# Patient Record
Sex: Male | Born: 1950
Health system: Southern US, Community
[De-identification: ages and names within clinical notes are randomized; demographics above are authoritative.]

## PROBLEM LIST (undated history)

## (undated) DIAGNOSIS — Z8669 Personal history of other diseases of the nervous system and sense organs: Secondary | ICD-10-CM

## (undated) DIAGNOSIS — T7840XA Allergy, unspecified, initial encounter: Secondary | ICD-10-CM

## (undated) DIAGNOSIS — I10 Essential (primary) hypertension: Secondary | ICD-10-CM

## (undated) DIAGNOSIS — E785 Hyperlipidemia, unspecified: Secondary | ICD-10-CM

## (undated) DIAGNOSIS — N179 Acute kidney failure, unspecified: Secondary | ICD-10-CM

## (undated) DIAGNOSIS — R519 Headache, unspecified: Secondary | ICD-10-CM

## (undated) HISTORY — DX: Personal history of other diseases of the nervous system and sense organs: Z86.69

## (undated) HISTORY — PX: TONSILLECTOMY: SUR1361

## (undated) HISTORY — DX: Hyperlipidemia, unspecified: E78.5

## (undated) HISTORY — DX: Essential (primary) hypertension: I10

## (undated) HISTORY — DX: Allergy, unspecified, initial encounter: T78.40XA

---
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
## 2014-01-17 ENCOUNTER — Ambulatory Visit (INDEPENDENT_AMBULATORY_CARE_PROVIDER_SITE_OTHER): Payer: Self-pay | Admitting: Physician Assistant

## 2014-01-17 ENCOUNTER — Encounter (INDEPENDENT_AMBULATORY_CARE_PROVIDER_SITE_OTHER): Payer: Self-pay

## 2014-01-17 VITALS — BP 140/82 | HR 78 | Temp 98.2°F | Ht 72.0 in | Wt 204.0 lb

## 2014-01-17 DIAGNOSIS — S81009A Unspecified open wound, unspecified knee, initial encounter: Secondary | ICD-10-CM

## 2014-01-17 DIAGNOSIS — W540XXA Bitten by dog, initial encounter: Secondary | ICD-10-CM

## 2014-01-17 DIAGNOSIS — S81809A Unspecified open wound, unspecified lower leg, initial encounter: Secondary | ICD-10-CM

## 2014-01-17 DIAGNOSIS — S81851A Open bite, right lower leg, initial encounter: Secondary | ICD-10-CM

## 2014-01-17 DIAGNOSIS — Z23 Encounter for immunization: Secondary | ICD-10-CM

## 2014-01-17 DIAGNOSIS — S91009A Unspecified open wound, unspecified ankle, initial encounter: Secondary | ICD-10-CM

## 2014-01-17 MED ORDER — AMOXICILLIN-POT CLAVULANATE 875-125 MG PO TABS: 1.0000 | ORAL_TABLET | Freq: Two times a day (BID) | ORAL | Status: DC

## 2014-01-17 NOTE — Progress Notes (Signed)
Subjective:     [name], male   DOB: 11-01-1950, 63 y.o.   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

## 2014-01-17 NOTE — Patient Instructions (Signed)

## 2014-02-02 ENCOUNTER — Encounter: Payer: Self-pay | Admitting: Family Medicine

## 2014-02-02 ENCOUNTER — Ambulatory Visit (INDEPENDENT_AMBULATORY_CARE_PROVIDER_SITE_OTHER): Payer: Self-pay

## 2014-02-02 ENCOUNTER — Ambulatory Visit (INDEPENDENT_AMBULATORY_CARE_PROVIDER_SITE_OTHER): Payer: Self-pay | Admitting: Family Medicine

## 2014-02-02 VITALS — BP 126/82 | HR 75 | Temp 97.5°F | Ht 72.0 in | Wt 210.0 lb

## 2014-02-02 DIAGNOSIS — M25561 Pain in right knee: Secondary | ICD-10-CM

## 2014-02-02 DIAGNOSIS — M25569 Pain in unspecified knee: Secondary | ICD-10-CM

## 2014-02-02 NOTE — Progress Notes (Signed)
   Subjective:    [name], male    DOB: 1951-05-20, 63 y.o.   MRN: [id number]  HPI This 63 y.o. male presents for evaluation of  Follow up on a dog bite right leg.  He points to behind his knee and c/o discomfort worst in the am when he wakes up.  He has discomfort behind the right knee when walking to his car on occasion. He has been having the discomfort move in the right knee toward the medial aspect of the right knee.  He feels like the knee is unstable at times.  The pain is moderate sharp and is worse at night.  He c/o discomfort when having to walk up incline in right knee.   Review of Systems C/o right knee pain No chest pain, SOB, HA, dizziness, vision change, N/V, diarrhea, constipation, dysuria, urinary urgency or frequency, myalgias, arthralgias or rash.     Objective:   Physical Exam  General  -  63 y/o male in NAD  Normal right knee exam.  No bite or scar or puncture seen right knee or right leg.  No TTP right knee Negative drawer, negative lachman, negative varus or valgus strain and patient is bearing weight w/o limping.  Normal Gait.    Skin - No scarring or wound seen right posterior knee or right femur area.  Xray right knee - Normal right knee Prelimnary reading by Iverson Alamin     Assessment & Plan:  Right knee pain - Plan: DG Knee 1-2 Views Right Tylenol and motrin otc prn for knee discomfort. Follow up prn.  Lysbeth Penner FNP

## 2014-12-26 ENCOUNTER — Encounter: Payer: Self-pay | Admitting: Family Medicine

## 2014-12-26 ENCOUNTER — Encounter (INDEPENDENT_AMBULATORY_CARE_PROVIDER_SITE_OTHER): Payer: Self-pay

## 2014-12-26 ENCOUNTER — Ambulatory Visit (INDEPENDENT_AMBULATORY_CARE_PROVIDER_SITE_OTHER): Payer: Self-pay | Admitting: Family Medicine

## 2014-12-26 VITALS — BP 138/91 | HR 64 | Temp 97.0°F | Ht 72.0 in | Wt 216.0 lb

## 2014-12-26 DIAGNOSIS — M720 Palmar fascial fibromatosis [Dupuytren]: Secondary | ICD-10-CM

## 2014-12-26 DIAGNOSIS — Z0000 Encounter for general adult medical examination without abnormal findings: Secondary | ICD-10-CM

## 2014-12-26 DIAGNOSIS — IMO0001 Reserved for inherently not codable concepts without codable children: Secondary | ICD-10-CM | POA: Insufficient documentation

## 2014-12-26 DIAGNOSIS — R011 Cardiac murmur, unspecified: Secondary | ICD-10-CM | POA: Insufficient documentation

## 2014-12-26 DIAGNOSIS — R030 Elevated blood-pressure reading, without diagnosis of hypertension: Secondary | ICD-10-CM

## 2014-12-26 LAB — GLUCOSE, POCT (MANUAL RESULT ENTRY): POC GLUCOSE: 100 mg/dL — AB (ref 70–99)

## 2014-12-26 NOTE — Progress Notes (Signed)
   Subjective:    [name], male    DOB: 1951/06/16, 64 y.o.   MRN: [id number]  HPI  64 year old male who really hasn't gotten any medical care for years. He formerly was active playing tennis and basketball in college. 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 He does not have any insurance and would like to keep lab work to a minimum. I did discuss basic recommendations such as blood sugar lipids PSA as well as exam such as colonoscopy but he wants to defer most of these tests. He has concerns about some changes in his skin which consist of purplish areas family history is not especially revealing in terms of inheritable diseases.  .  Review of Systems  Constitutional: Negative.   HENT: Negative.   Respiratory: Negative.   Cardiovascular: Negative.   Gastrointestinal: Negative.   Skin: Positive for color change.  Psychiatric/Behavioral: Negative.        Objective:   Physical Exam  Constitutional: He appears well-developed and well-nourished.  HENT:  Head: Normocephalic.  Eyes: Pupils are equal, round, and reactive to light.  Neck: Normal range of motion.  Cardiovascular: Normal rate and regular rhythm.   There is a systolic murmur heard at the apex suggesting mitral valve disease. He has never been told that he has murmur I would write this a grade 2-3/6  Pulmonary/Chest: Effort normal and breath sounds normal.  Abdominal: Soft. Bowel sounds are normal.  Genitourinary: Prostate normal.  Musculoskeletal: Normal range of motion.  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 There are also scars around his left knee secondary to prior surgeries and injuries when playing basketball  Skin:  Discoloration looks like livido reticularis  Psychiatric: He has a normal mood and affect. His behavior is normal.    BP 138/91 mmHg  Pulse 64  Temp(Src) 97 F (36.1 C) (Oral)  Ht 6' (1.829  m)  Wt 216 lb (97.977 kg)  BMI 29.29 kg/m2        Assessment & Plan:  1. Health care maintenance Blood sugar at 100 is not remarkable. I did encourage watching carbohydrates regular exercise and weight loss - POCT glucose (manual entry)  2. Newly recognized heart murmur I think this is related to mitral valve disease. Since patient does not have insurance he is not inclined to see a cardiologist or have an echocardiogram to further define this new finding. He denies any shortness of breath except as related to deconditioning and age but cautioned if he starts getting more short of breath we might need to have his heart evaluated sooner than later  3. Elevated blood pressure Blood pressure is mildly elevated today. In my reading it was 140/100. 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 There are no definite contractures as yet but may need services of an orthopedist in the futureen's contracture Wardell Honour MD

## 2014-12-26 NOTE — Patient Instructions (Signed)
Health Maintenance A healthy lifestyle and preventative care can promote health and wellness.  Maintain regular health, dental, and eye exams.  Eat a healthy diet. Foods like vegetables, fruits, whole grains, low-fat dairy products, and lean protein foods contain the nutrients you need and are low in calories. Decrease your intake of foods high in solid fats, added sugars, and salt. Get information about a proper diet from your health care provider, if necessary.  Regular physical exercise is one of the most important things you can do for your health. Most adults should get at least 150 minutes of moderate-intensity exercise (any activity that increases your heart rate and causes you to sweat) each week. In addition, most adults need muscle-strengthening exercises on 2 or more days a week.   Maintain a healthy weight. The body mass index (BMI) is a screening tool to identify possible weight problems. It provides an estimate of body fat based on height and weight. Your health care provider can find your BMI and can help you achieve or maintain a healthy weight. For males 20 years and older:  A BMI below 18.5 is considered underweight.  A BMI of 18.5 to 24.9 is normal.  A BMI of 25 to 29.9 is considered overweight.  A BMI of 30 and above is considered obese.  Maintain normal blood lipids and cholesterol by exercising and minimizing your intake of saturated fat. Eat a balanced diet with plenty of fruits and vegetables. Blood tests for lipids and cholesterol should begin at age 20 and be repeated every 5 years. If your lipid or cholesterol levels are high, you are over age 50, or you are at high risk for heart disease, you may need your cholesterol levels checked more frequently.Ongoing high lipid and cholesterol levels should be treated with medicines if diet and exercise are not working.  If you smoke, find out from your health care provider how to quit. If you do not use tobacco, do not  start.  Lung cancer screening is recommended for adults aged 55-80 years who are at high risk for developing lung cancer because of a history of smoking. A yearly low-dose CT scan of the lungs is recommended for people who have at least a 30-pack-year history of smoking and are current smokers or have quit within the past 15 years. A pack year of smoking is smoking an average of 1 pack of cigarettes a day for 1 year (for example, a 30-pack-year history of smoking could mean smoking 1 pack a day for 30 years or 2 packs a day for 15 years). Yearly screening should continue until the smoker has stopped smoking for at least 15 years. Yearly screening should be stopped for people who develop a health problem that would prevent them from having lung cancer treatment.  If you choose to drink alcohol, do not have more than 2 drinks per day. One drink is considered to be 12 oz (360 mL) of beer, 5 oz (150 mL) of wine, or 1.5 oz (45 mL) of liquor.  Avoid the use of street drugs. Do not share needles with anyone. Ask for help if you need support or instructions about stopping the use of drugs.  High blood pressure causes heart disease and increases the risk of stroke. Blood pressure should be checked at least every 1-2 years. Ongoing high blood pressure should be treated with medicines if weight loss and exercise are not effective.  If you are 45-79 years old, ask your health care provider if   you should take aspirin to prevent heart disease.  Diabetes screening involves taking a blood sample to check your fasting blood sugar level. This should be done once every 3 years after age 45 if you are at a normal weight and without risk factors for diabetes. Testing should be considered at a younger age or be carried out more frequently if you are overweight and have at least 1 risk factor for diabetes.  Colorectal cancer can be detected and often prevented. Most routine colorectal cancer screening begins at the age of 50  and continues through age 75. However, your health care provider may recommend screening at an earlier age if you have risk factors for colon cancer. On a yearly basis, your health care provider may provide home test kits to check for hidden blood in the stool. A small camera at the end of a tube may be used to directly examine the colon (sigmoidoscopy or colonoscopy) to detect the earliest forms of colorectal cancer. Talk to your health care provider about this at age 50 when routine screening begins. A direct exam of the colon should be repeated every 5-10 years through age 75, unless early forms of precancerous polyps or small growths are found.  People who are at an increased risk for hepatitis B should be screened for this virus. You are considered at high risk for hepatitis B if:  You were born in a country where hepatitis B occurs often. Talk with your health care provider about which countries are considered high risk.  Your parents were born in a high-risk country and you have not received a shot to protect against hepatitis B (hepatitis B vaccine).  You have HIV or AIDS.  You use needles to inject street drugs.  You live with, or have sex with, someone who has hepatitis B.  You are a man who has sex with other men (MSM).  You get hemodialysis treatment.  You take certain medicines for conditions like cancer, organ transplantation, and autoimmune conditions.  Hepatitis C blood testing is recommended for all people born from 1945 through 1965 and any individual with known risk factors for hepatitis C.  Healthy men should no longer receive prostate-specific antigen (PSA) blood tests as part of routine cancer screening. Talk to your health care provider about prostate cancer screening.  Testicular cancer screening is not recommended for adolescents or adult males who have no symptoms. Screening includes self-exam, a health care provider exam, and other screening tests. Consult with your  health care provider about any symptoms you have or any concerns you have about testicular cancer.  Practice safe sex. Use condoms and avoid high-risk sexual practices to reduce the spread of sexually transmitted infections (STIs).  You should be screened for STIs, including gonorrhea and chlamydia if:  You are sexually active and are younger than 24 years.  You are older than 24 years, and your health care provider tells you that you are at risk for this type of infection.  Your sexual activity has changed since you were last screened, and you are at an increased risk for chlamydia or gonorrhea. Ask your health care provider if you are at risk.  If you are at risk of being infected with HIV, it is recommended that you take a prescription medicine daily to prevent HIV infection. This is called pre-exposure prophylaxis (PrEP). You are considered at risk if:  You are a man who has sex with other men (MSM).  You are a heterosexual man who   is sexually active with multiple partners.  You take drugs by injection.  You are sexually active with a partner who has HIV.  Talk with your health care provider about whether you are at high risk of being infected with HIV. If you choose to begin PrEP, you should first be tested for HIV. You should then be tested every 3 months for as long as you are taking PrEP.  Use sunscreen. Apply sunscreen liberally and repeatedly throughout the day. You should seek shade when your shadow is shorter than you. Protect yourself by wearing long sleeves, pants, a wide-brimmed hat, and sunglasses year round whenever you are outdoors.  Tell your health care provider of new moles or changes in moles, especially if there is a change in shape or color. Also, tell your health care provider if a mole is larger than the size of a pencil eraser.  A one-time screening for abdominal aortic aneurysm (AAA) and surgical repair of large AAAs by ultrasound is recommended for men aged  65-75 years who are current or former smokers.  Stay current with your vaccines (immunizations). Document Released: 01/09/2008 Document Revised: 07/18/2013 Document Reviewed: 12/08/2010 ExitCare Patient Information 2015 [employer]. This information is not intended to replace advice given to you by your health care provider. Make sure you discuss any questions you have with your health care provider.  Hypertension Hypertension, commonly called high blood pressure, is when the force of blood pumping through your arteries is too strong. Your arteries are the blood vessels that carry blood from your heart throughout your body. A blood pressure reading consists of a higher number over a lower number, such as 110/72. The higher number (systolic) is the pressure inside your arteries when your heart pumps. The lower number (diastolic) is the pressure inside your arteries when your heart relaxes. Ideally you want your blood pressure below 120/80. Hypertension forces your heart to work harder to pump blood. Your arteries may become narrow or stiff. Having hypertension puts you at risk for heart disease, stroke, and other problems.  RISK FACTORS Some risk factors for high blood pressure are controllable. Others are not.  Risk factors you cannot control include:   Race. You may be at higher risk if you are African American.  Age. Risk increases with age.  Gender. Men are at higher risk than women before age 45 years. After age 65, women are at higher risk than men. Risk factors you can control include:  Not getting enough exercise or physical activity.  Being overweight.  Getting too much fat, sugar, calories, or salt in your diet.  Drinking too much alcohol. SIGNS AND SYMPTOMS Hypertension does not usually cause signs or symptoms. Extremely high blood pressure (hypertensive crisis) may cause headache, anxiety, shortness of breath, and nosebleed. DIAGNOSIS  To check if you have hypertension,  your health care provider will measure your blood pressure while you are seated, with your arm held at the level of your heart. It should be measured at least twice using the same arm. Certain conditions can cause a difference in blood pressure between your right and left arms. A blood pressure reading that is higher than normal on one occasion does not mean that you need treatment. If one blood pressure reading is high, ask your health care provider about having it checked again. TREATMENT  Treating high blood pressure includes making lifestyle changes and possibly taking medicine. Living a healthy lifestyle can help lower high blood pressure. You may need to change   some of your habits. Lifestyle changes may include:  Following the DASH diet. This diet is high in fruits, vegetables, and whole grains. It is low in salt, red meat, and added sugars.  Getting at least 2 hours of brisk physical activity every week.  Losing weight if necessary.  Not smoking.  Limiting alcoholic beverages.  Learning ways to reduce stress. If lifestyle changes are not enough to get your blood pressure under control, your health care provider may prescribe medicine. You may need to take more than one. Work closely with your health care provider to understand the risks and benefits. HOME CARE INSTRUCTIONS  Have your blood pressure rechecked as directed by your health care provider.   Take medicines only as directed by your health care provider. Follow the directions carefully. Blood pressure medicines must be taken as prescribed. The medicine does not work as well when you skip doses. Skipping doses also puts you at risk for problems.   Do not smoke.   Monitor your blood pressure at home as directed by your health care provider. SEEK MEDICAL CARE IF:   You think you are having a reaction to medicines taken.  You have recurrent headaches or feel dizzy.  You have swelling in your ankles.  You have  trouble with your vision. SEEK IMMEDIATE MEDICAL CARE IF:  You develop a severe headache or confusion.  You have unusual weakness, numbness, or feel faint.  You have severe chest or abdominal pain.  You vomit repeatedly.  You have trouble breathing. MAKE SURE YOU:   Understand these instructions.  Will watch your condition.  Will get help right away if you are not doing well or get worse. Document Released: 07/13/2005 Document Revised: 11/27/2013 Document Reviewed: 05/05/2013 ExitCare Patient Information 2015 [employer]. This information is not intended to replace advice given to you by your health care provider. Make sure you discuss any questions you have with your health care provider.  

## 2015-02-15 IMAGING — CR DG KNEE 1-2V*R*
2 series · 2 of 2 positions shown · non-contrast
Comparison: None.

CLINICAL DATA: Right knee pain

EXAM:
RIGHT KNEE - 1-2 VIEW

[view not recorded (1 of 2)]
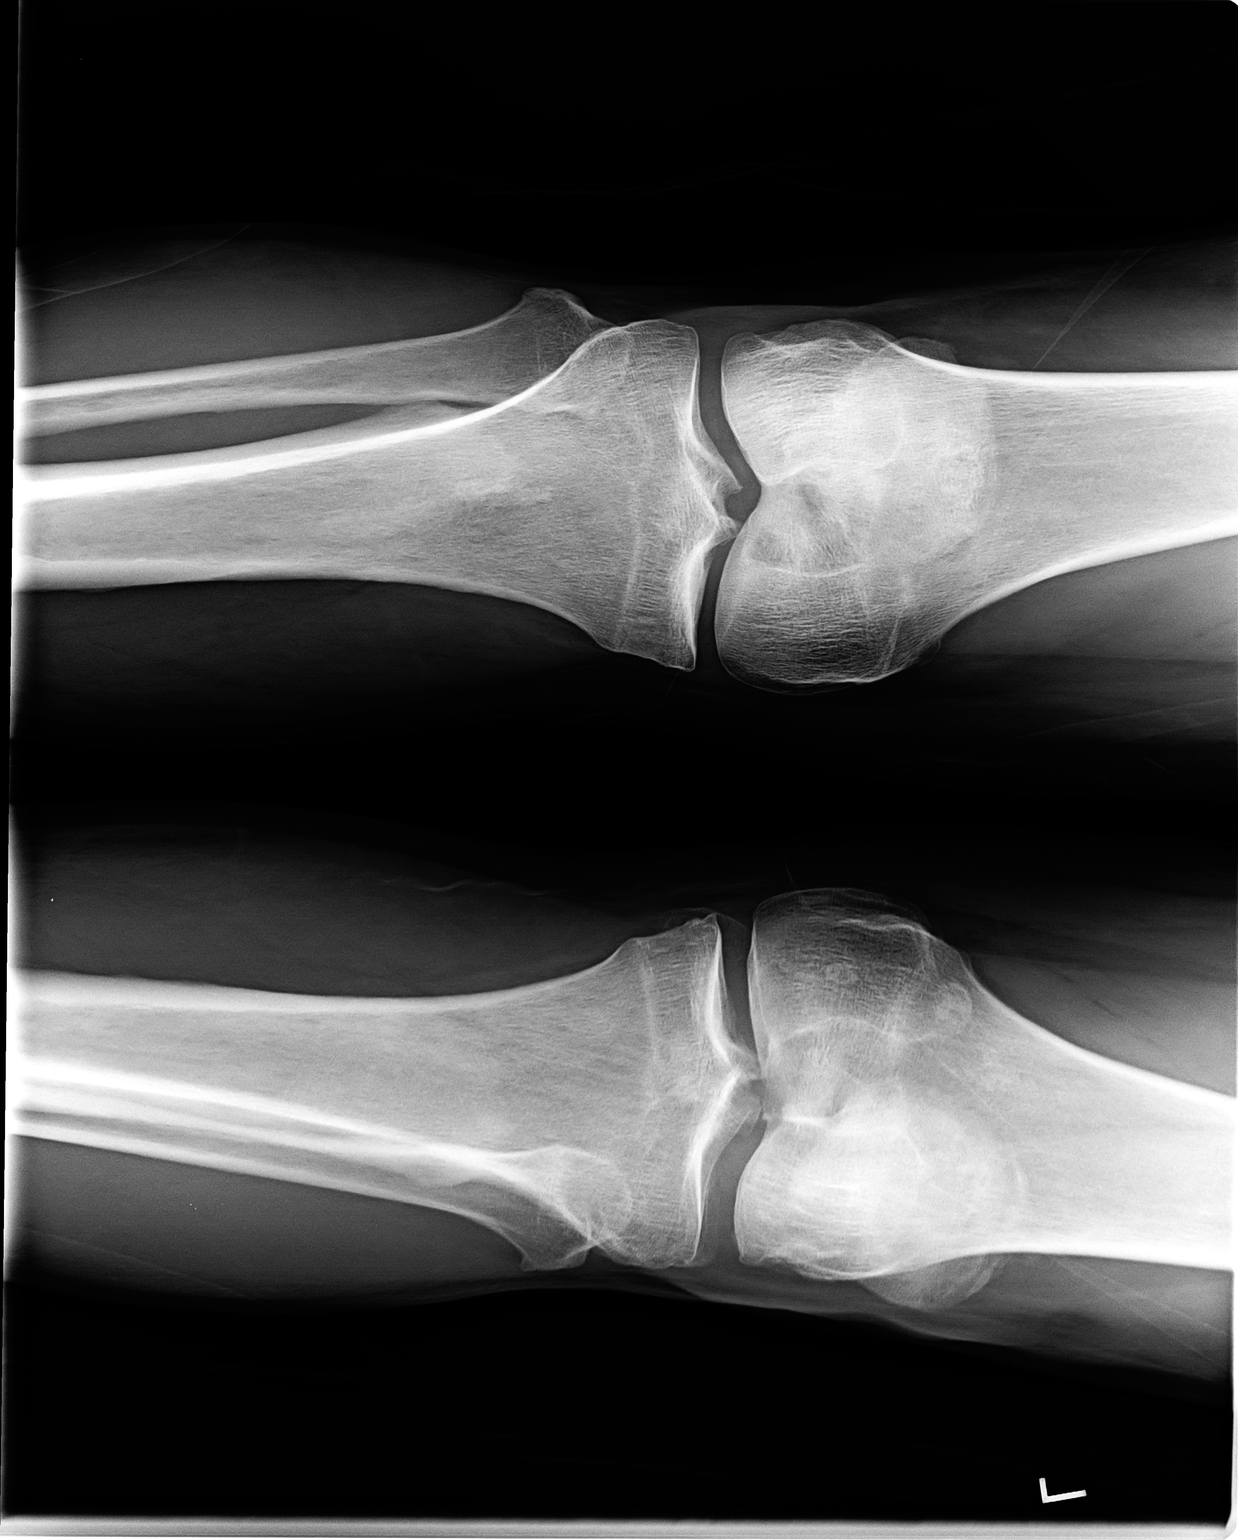

[view not recorded (2 of 2)]
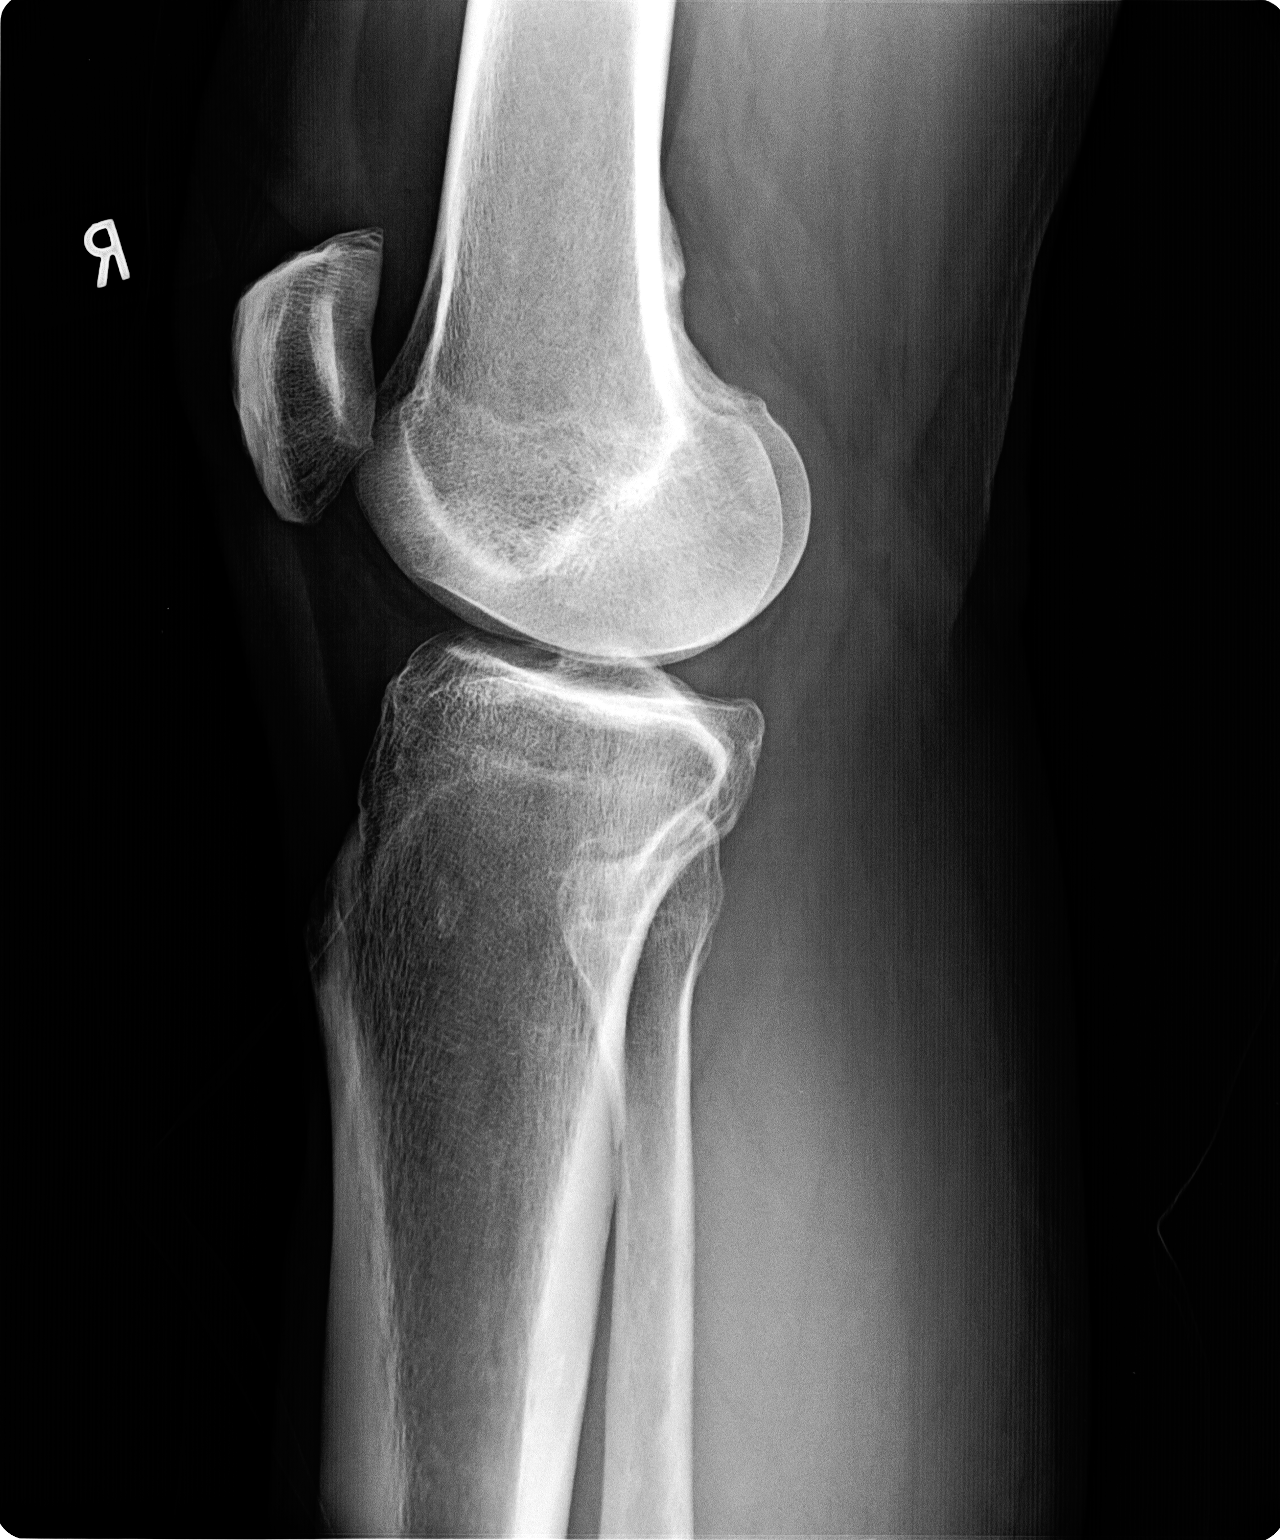

[2 of 2 positions shown; findings below may reference images not displayed]

FINDINGS: There is no evidence of fracture, dislocation, or joint effusion.
There is no evidence of arthropathy or other focal bone abnormality.
Soft tissues are unremarkable.
IMPRESSION: Negative.

## 2018-01-03 ENCOUNTER — Encounter: Payer: Self-pay | Admitting: Gastroenterology

## 2018-01-06 ENCOUNTER — Other Ambulatory Visit: Payer: Self-pay

## 2018-01-06 ENCOUNTER — Ambulatory Visit (AMBULATORY_SURGERY_CENTER): Payer: Self-pay | Admitting: *Deleted

## 2018-01-06 VITALS — Ht 71.0 in | Wt 194.0 lb

## 2018-01-06 DIAGNOSIS — Z1211 Encounter for screening for malignant neoplasm of colon: Secondary | ICD-10-CM

## 2018-01-06 MED ORDER — PEG-KCL-NACL-NASULF-NA ASC-C 140 G PO SOLR: 1.0000 | Freq: Once | ORAL | 0 refills | Status: AC

## 2018-01-06 NOTE — Progress Notes (Signed)
Patient denies any allergies to egg or soy products. Patient denies complications with anesthesia/sedation.  Patient denies oxygen use at home and denies diet medications.  Pamphlet given on colonoscopy procedure.

## 2018-01-19 ENCOUNTER — Telehealth: Payer: Self-pay | Admitting: Gastroenterology

## 2018-01-19 NOTE — Telephone Encounter (Signed)
Pt's girlfriend Joelene Millin called to inform that plenvu is not covered but insurance and is over $200. She wants to know if there is something similar that he can get.

## 2018-01-19 NOTE — Telephone Encounter (Signed)
Spoke with patient. Due to expense of the Plenvu, costing over $200, the patient will come by our office to pick up a sample. Plenvu sample, Lot #[id number],Exp 07/202 will be left at the front desk on the 4th floor. The pt will come by to pick the sample in the am and call our office if he has other questions. Gwyndolyn Saxon.

## 2018-01-25 ENCOUNTER — Encounter: Payer: Self-pay | Admitting: Gastroenterology

## 2018-02-03 ENCOUNTER — Encounter: Payer: Self-pay | Admitting: Gastroenterology

## 2018-02-03 ENCOUNTER — Ambulatory Visit (AMBULATORY_SURGERY_CENTER): Payer: Medicare HMO | Admitting: Gastroenterology

## 2018-02-03 ENCOUNTER — Other Ambulatory Visit: Payer: Self-pay

## 2018-02-03 VITALS — BP 137/95 | HR 69 | Temp 98.6°F | Resp 12 | Ht 71.0 in | Wt 194.0 lb

## 2018-02-03 DIAGNOSIS — D125 Benign neoplasm of sigmoid colon: Secondary | ICD-10-CM

## 2018-02-03 DIAGNOSIS — D120 Benign neoplasm of cecum: Secondary | ICD-10-CM

## 2018-02-03 DIAGNOSIS — D124 Benign neoplasm of descending colon: Secondary | ICD-10-CM

## 2018-02-03 DIAGNOSIS — Z800 Family history of malignant neoplasm of digestive organs: Secondary | ICD-10-CM | POA: Diagnosis not present

## 2018-02-03 DIAGNOSIS — D122 Benign neoplasm of ascending colon: Secondary | ICD-10-CM | POA: Diagnosis not present

## 2018-02-03 DIAGNOSIS — D123 Benign neoplasm of transverse colon: Secondary | ICD-10-CM | POA: Diagnosis not present

## 2018-02-03 DIAGNOSIS — Z1211 Encounter for screening for malignant neoplasm of colon: Secondary | ICD-10-CM

## 2018-02-03 DIAGNOSIS — K635 Polyp of colon: Secondary | ICD-10-CM

## 2018-02-03 DIAGNOSIS — D128 Benign neoplasm of rectum: Secondary | ICD-10-CM

## 2018-02-03 MED ORDER — SODIUM CHLORIDE 0.9 % IV SOLN: 500.0000 mL | Freq: Once | INTRAVENOUS | Status: DC

## 2018-02-03 NOTE — Patient Instructions (Signed)
YOU HAD AN ENDOSCOPIC PROCEDURE TODAY AT [facility]:   Refer to the procedure report that was given to you for any specific questions about what was found during the examination.  If the procedure report does not answer your questions, please call your gastroenterologist to clarify.  If you requested that your care partner not be given the details of your procedure findings, then the procedure report has been included in a sealed envelope for you to review at your convenience later.  YOU SHOULD EXPECT: Some feelings of bloating in the abdomen. Passage of more gas than usual.  Walking can help get rid of the air that was put into your GI tract during the procedure and reduce the bloating. If you had a lower endoscopy (such as a colonoscopy or flexible sigmoidoscopy) you may notice spotting of blood in your stool or on the toilet paper. If you underwent a bowel prep for your procedure, you may not have a normal bowel movement for a few days.  Please Note:  You might notice some irritation and congestion in your nose or some drainage.  This is from the oxygen used during your procedure.  There is no need for concern and it should clear up in a day or so.  SYMPTOMS TO REPORT IMMEDIATELY:   Following lower endoscopy (colonoscopy or flexible sigmoidoscopy):  Excessive amounts of blood in the stool  Significant tenderness or worsening of abdominal pains  Swelling of the abdomen that is new, acute  Fever of 100F or higher   For urgent or emergent issues, a gastroenterologist can be reached at any hour by calling [phone].   DIET:  We do recommend a small meal at first, but then you may proceed to your regular diet.  Drink plenty of fluids but you should avoid alcoholic beverages for 24 hours.  ACTIVITY:  You should plan to take it easy for the rest of today and you should NOT DRIVE or use heavy machinery until tomorrow (because of the sedation medicines used during the test).     FOLLOW UP: Our staff will call the number listed on your records the next business day following your procedure to check on you and address any questions or concerns that you may have regarding the information given to you following your procedure. If we do not reach you, we will leave a message.  However, if you are feeling well and you are not experiencing any problems, there is no need to return our call.  We will assume that you have returned to your regular daily activities without incident.  If any biopsies were taken you will be contacted by phone or by letter within the next 1-3 weeks.  Please call us at [phone] if you have not heard about the biopsies in 3 weeks.    SIGNATURES/CONFIDENTIALITY: You and/or your care partner have signed paperwork which will be entered into your electronic medical record.  These signatures attest to the fact that that the information above on your After Visit Summary has been reviewed and is understood.  Full responsibility of the confidentiality of this discharge information lies with you and/or your care-partner.  Read all of the papers given to you by your recovery room nurse.

## 2018-02-03 NOTE — Progress Notes (Signed)
Pt's states no medical or surgical changes since previsit or office visit. 

## 2018-02-03 NOTE — Progress Notes (Signed)
Called to room to assist during endoscopic procedure.  Patient ID and intended procedure confirmed with present staff. Received instructions for my participation in the procedure from the performing physician.  

## 2018-02-03 NOTE — Op Note (Signed)
[facility] Patient Name: Mike Thomas Procedure Date: 02/03/2018 4:25 PM MRN: [id number] Endoscopist: Mallie Mussel L. Loletha Carrow , MD Age: 67 Referring MD:  Date of Birth: March 22, 1951 Gender: Male Account #: [id number] Procedure:                Colonoscopy Indications:              Screening in patient at increased risk: Colorectal                            cancer in mother 22 or older (age 31), This is the                            patient's first colonoscopy Medicines:                Monitored Anesthesia Care Procedure:                Pre-Anesthesia Assessment:                           - Prior to the procedure, a History and Physical                            was performed, and patient medications and                            allergies were reviewed. The patient's tolerance of                            previous anesthesia was also reviewed. The risks                            and benefits of the procedure and the sedation                            options and risks were discussed with the patient.                            All questions were answered, and informed consent                            was obtained. Prior Anticoagulants: The patient has                            taken no previous anticoagulant or antiplatelet                            agents. ASA Grade Assessment: II - A patient with                            mild systemic disease. After reviewing the risks                            and benefits, the patient was deemed in  satisfactory condition to undergo the procedure.                           After obtaining informed consent, the colonoscope                            was passed under direct vision. Throughout the                            procedure, the patient's blood pressure, pulse, and                            oxygen saturations were monitored continuously. The                            Model CF-HQ190L [id number])  scope was introduced                            through the anus and advanced to the the cecum,                            identified by appendiceal orifice and ileocecal                            valve. The colonoscopy was performed without                            difficulty. The patient tolerated the procedure                            well. The colonoscopy was performed without                            difficulty. The patient tolerated the procedure                            well. The quality of the bowel preparation was                            good. The ileocecal valve, appendiceal orifice, and                            rectum were photographed. The quality of the bowel                            preparation was evaluated using the BBPS [facility]                            Bowel Preparation Scale) with scores of: Right                            Colon = 2, Transverse Colon = 2 and Left Colon = 2.  The total BBPS score equals 6. Scope In: 4:29:35 PM Scope Out: 4:56:55 PM Scope Withdrawal Time: 0 hours 21 minutes 33 seconds  Total Procedure Duration: 0 hours 27 minutes 20 seconds  Findings:                 The perianal and digital rectal examinations were                            normal.                           Three sessile polyps were found in the transverse                            colon, ascending colon and ileocecal valve. The                            polyps were 4 to 8 mm in size. These polyps were                            removed with a cold snare. Resection and retrieval                            were complete.                           Three sessile polyps were found in the descending                            colon and transverse colon. The polyps were 4 to 6                            mm in size. These polyps were removed with a cold                            snare. Resection and retrieval were complete.                            Two sessile polyps were found in the descending                            colon. The polyps were 4 to 6 mm in size. These                            polyps were removed with a cold snare. Resection                            and retrieval were complete.                           Two sessile polyps were found in the rectum and                            sigmoid colon.  The polyps were 4 mm in size. These                            polyps were removed with a cold snare. Resection                            and retrieval were complete.                           Internal hemorrhoids were found. The hemorrhoids                            were Grade I (internal hemorrhoids that do not                            prolapse).                           The exam was otherwise without abnormality on                            direct and retroflexion views. Complications:            No immediate complications. Estimated Blood Loss:     Estimated blood loss was minimal. Impression:               - Three 4 to 8 mm polyps in the transverse colon,                            in the ascending colon and at the ileocecal valve,                            removed with a cold snare. Resected and retrieved.                           - Three 4 to 6 mm polyps in the descending colon                            and in the transverse colon, removed with a cold                            snare. Resected and retrieved.                           - Two 4 to 6 mm polyps in the descending colon,                            removed with a cold snare. Resected and retrieved.                           - Two 4 mm polyps in the rectum and in the sigmoid                            colon, removed  with a cold snare. Resected and                            retrieved.                           - Internal hemorrhoids.                           - The examination was otherwise normal on direct                            and  retroflexion views. Recommendation:           - Patient has a contact number available for                            emergencies. The signs and symptoms of potential                            delayed complications were discussed with the                            patient. Return to normal activities tomorrow.                            Written discharge instructions were provided to the                            patient.                           - Resume previous diet.                           - Continue present medications.                           - Await pathology results.                           - Repeat colonoscopy is recommended for                            surveillance. The colonoscopy date will be                            determined after pathology results from today's                            exam become available for review. Henry L. Loletha Carrow, MD 02/03/2018 5:15:48 PM This report has been signed electronically.

## 2018-02-03 NOTE — Progress Notes (Signed)
Report given to PACU, vss 

## 2018-02-04 ENCOUNTER — Telehealth: Payer: Self-pay | Admitting: *Deleted

## 2018-02-04 NOTE — Telephone Encounter (Signed)
  Follow up Call-  Call back number 02/03/2018  Post procedure Call Back phone  # [phone] cell  Permission to leave phone message Yes  Some recent data might be hidden     Patient questions:  Message left to call us if necessary.

## 2018-02-04 NOTE — Telephone Encounter (Signed)
  Follow up Call-  Call back number 02/03/2018  Post procedure Call Back phone  # [phone] cell  Permission to leave phone message Yes  Some recent data might be hidden     Patient questions:  Do you have a fever, pain , or abdominal swelling? No. Pain Score  0 *  Have you tolerated food without any problems? Yes.    Have you been able to return to your normal activities? Yes.    Do you have any questions about your discharge instructions: Diet   Yes.   Medications  No. Follow up visit  No.  Do you have questions or concerns about your Care? Yes.    Actions: * If pain score is 4 or above: No action needed, pain <4.

## 2018-02-13 ENCOUNTER — Encounter: Payer: Self-pay | Admitting: Gastroenterology

## 2019-02-07 ENCOUNTER — Encounter: Payer: Self-pay | Admitting: Gastroenterology

## 2020-09-25 ENCOUNTER — Encounter (HOSPITAL_COMMUNITY)
Admission: EM | Disposition: A | Discharge status: Rehabilitation Facility | Payer: Self-pay | Source: Other Acute Inpatient Hospital | Attending: Neurological Surgery

## 2020-09-25 ENCOUNTER — Inpatient Hospital Stay (HOSPITAL_COMMUNITY)
Admission: RE | Admit: 2020-09-25 | Payer: Medicare HMO | Source: Other Acute Inpatient Hospital | Admitting: Neurological Surgery

## 2020-09-25 ENCOUNTER — Emergency Department (HOSPITAL_COMMUNITY): Payer: Medicare HMO | Admitting: Anesthesiology

## 2020-09-25 ENCOUNTER — Inpatient Hospital Stay (HOSPITAL_COMMUNITY)
Admission: EM | Admit: 2020-09-25 | Discharge: 2020-10-03 | DRG: 026 | Disposition: A | Discharge status: Rehabilitation Facility | Payer: Medicare HMO | Source: Other Acute Inpatient Hospital | Attending: Neurological Surgery | Admitting: Neurological Surgery

## 2020-09-25 ENCOUNTER — Encounter (HOSPITAL_COMMUNITY): Payer: Self-pay | Admitting: General Practice

## 2020-09-25 DIAGNOSIS — S065XAA Traumatic subdural hemorrhage with loss of consciousness status unknown, initial encounter: Secondary | ICD-10-CM | POA: Diagnosis present

## 2020-09-25 DIAGNOSIS — E785 Hyperlipidemia, unspecified: Secondary | ICD-10-CM | POA: Diagnosis present

## 2020-09-25 DIAGNOSIS — I10 Essential (primary) hypertension: Secondary | ICD-10-CM | POA: Diagnosis present

## 2020-09-25 DIAGNOSIS — R259 Unspecified abnormal involuntary movements: Secondary | ICD-10-CM | POA: Diagnosis not present

## 2020-09-25 DIAGNOSIS — I4891 Unspecified atrial fibrillation: Secondary | ICD-10-CM

## 2020-09-25 DIAGNOSIS — Z79899 Other long term (current) drug therapy: Secondary | ICD-10-CM

## 2020-09-25 DIAGNOSIS — I6201 Nontraumatic acute subdural hemorrhage: Principal | ICD-10-CM | POA: Diagnosis present

## 2020-09-25 DIAGNOSIS — S065X9D Traumatic subdural hemorrhage with loss of consciousness of unspecified duration, subsequent encounter: Secondary | ICD-10-CM | POA: Diagnosis not present

## 2020-09-25 DIAGNOSIS — W19XXXA Unspecified fall, initial encounter: Secondary | ICD-10-CM | POA: Diagnosis present

## 2020-09-25 DIAGNOSIS — R339 Retention of urine, unspecified: Secondary | ICD-10-CM | POA: Diagnosis not present

## 2020-09-25 DIAGNOSIS — I350 Nonrheumatic aortic (valve) stenosis: Secondary | ICD-10-CM | POA: Diagnosis present

## 2020-09-25 DIAGNOSIS — I4819 Other persistent atrial fibrillation: Secondary | ICD-10-CM | POA: Diagnosis present

## 2020-09-25 DIAGNOSIS — G441 Vascular headache, not elsewhere classified: Secondary | ICD-10-CM | POA: Diagnosis not present

## 2020-09-25 DIAGNOSIS — Z9889 Other specified postprocedural states: Secondary | ICD-10-CM

## 2020-09-25 DIAGNOSIS — E871 Hypo-osmolality and hyponatremia: Secondary | ICD-10-CM | POA: Diagnosis not present

## 2020-09-25 DIAGNOSIS — K5903 Drug induced constipation: Secondary | ICD-10-CM | POA: Diagnosis not present

## 2020-09-25 DIAGNOSIS — F17210 Nicotine dependence, cigarettes, uncomplicated: Secondary | ICD-10-CM | POA: Diagnosis present

## 2020-09-25 DIAGNOSIS — Z9181 History of falling: Secondary | ICD-10-CM

## 2020-09-25 DIAGNOSIS — R8279 Other abnormal findings on microbiological examination of urine: Secondary | ICD-10-CM | POA: Diagnosis not present

## 2020-09-25 DIAGNOSIS — M7989 Other specified soft tissue disorders: Secondary | ICD-10-CM | POA: Diagnosis not present

## 2020-09-25 DIAGNOSIS — I6203 Nontraumatic chronic subdural hemorrhage: Secondary | ICD-10-CM | POA: Diagnosis present

## 2020-09-25 DIAGNOSIS — S065X9A Traumatic subdural hemorrhage with loss of consciousness of unspecified duration, initial encounter: Secondary | ICD-10-CM | POA: Diagnosis not present

## 2020-09-25 DIAGNOSIS — I5020 Unspecified systolic (congestive) heart failure: Secondary | ICD-10-CM

## 2020-09-25 DIAGNOSIS — R609 Edema, unspecified: Secondary | ICD-10-CM | POA: Diagnosis not present

## 2020-09-25 DIAGNOSIS — Z781 Physical restraint status: Secondary | ICD-10-CM | POA: Diagnosis not present

## 2020-09-25 DIAGNOSIS — N179 Acute kidney failure, unspecified: Secondary | ICD-10-CM | POA: Diagnosis not present

## 2020-09-25 LAB — ABO/RH: ABO/RH(D): A POS

## 2020-09-25 LAB — MRSA PCR SCREENING: MRSA by PCR: NEGATIVE

## 2020-09-25 LAB — TYPE AND SCREEN
ABO/RH(D): A POS
Antibody Screen: NEGATIVE

## 2020-09-25 LAB — TSH: TSH: 1.66 u[IU]/mL (ref 0.350–4.500)

## 2020-09-25 SURGERY — CRANIOTOMY HEMATOMA EVACUATION SUBDURAL
Anesthesia: General | Laterality: Bilateral

## 2020-09-25 MED ORDER — ORAL CARE MOUTH RINSE
15.0000 mL | Freq: Once | OROMUCOSAL | Status: AC
  Administered 2020-09-25: 15 mL via OROMUCOSAL

## 2020-09-25 MED ORDER — METOPROLOL TARTRATE 5 MG/5ML IV SOLN: 5.0000 mg | Freq: Once | INTRAVENOUS | Status: AC

## 2020-09-25 MED ORDER — MIDAZOLAM HCL 2 MG/2ML IJ SOLN
INTRAMUSCULAR | Status: AC
  Filled 2020-09-25: qty 2

## 2020-09-25 MED ORDER — LIDOCAINE 2% (20 MG/ML) 5 ML SYRINGE
INTRAMUSCULAR | Status: AC
  Filled 2020-09-25: qty 5

## 2020-09-25 MED ORDER — FENTANYL CITRATE (PF) 250 MCG/5ML IJ SOLN
INTRAMUSCULAR | Status: AC
  Filled 2020-09-25: qty 5

## 2020-09-25 MED ORDER — ROCURONIUM BROMIDE 10 MG/ML (PF) SYRINGE
PREFILLED_SYRINGE | INTRAVENOUS | Status: AC
  Filled 2020-09-25: qty 10

## 2020-09-25 MED ORDER — CHLORHEXIDINE GLUCONATE 0.12 % MT SOLN: 15.0000 mL | Freq: Once | OROMUCOSAL | Status: AC

## 2020-09-25 MED ORDER — METOPROLOL TARTRATE 5 MG/5ML IV SOLN
INTRAVENOUS | Status: AC
  Administered 2020-09-25: 5 mg via INTRAVENOUS
  Filled 2020-09-25: qty 5

## 2020-09-25 MED ORDER — DILTIAZEM HCL 25 MG/5ML IV SOLN
10.0000 mg | Freq: Once | INTRAVENOUS | Status: AC
  Administered 2020-09-25: 10 mg via INTRAVENOUS
  Filled 2020-09-25 (×2): qty 5

## 2020-09-25 MED ORDER — DILTIAZEM HCL-DEXTROSE 125-5 MG/125ML-% IV SOLN (PREMIX)
5.0000 mg/h | INTRAVENOUS | Status: DC
  Administered 2020-09-25: 5 mg/h via INTRAVENOUS
  Administered 2020-09-26: 15 mg/h via INTRAVENOUS
  Filled 2020-09-25 (×4): qty 125

## 2020-09-25 MED ORDER — SODIUM CHLORIDE 0.9 % IV SOLN: Freq: Once | INTRAVENOUS | Status: AC

## 2020-09-25 MED ORDER — ONDANSETRON HCL 4 MG/2ML IJ SOLN
INTRAMUSCULAR | Status: AC
  Filled 2020-09-25: qty 2

## 2020-09-25 MED ORDER — ATORVASTATIN CALCIUM 10 MG PO TABS
20.0000 mg | ORAL_TABLET | Freq: Every day | ORAL | Status: DC
  Administered 2020-09-25 – 2020-10-03 (×9): 20 mg via ORAL
  Filled 2020-09-25 (×9): qty 2

## 2020-09-25 NOTE — Progress Notes (Signed)
Received a call re: Afib RVR in patient with subdural hematoma, going to OR in an hour or so. I am unable to see the patient immediately due to another urgent patient situation. I discussed with the provider over the phone. In absence of any known heart failure, okay to start diltiazem drip, titrate up 20 mg/hr, as tolerated. Unable to use anticoagulation given the presentation. I will see him, probably post-op.   Nigel Mormon, MD Pager: [phone] Office: [phone]

## 2020-09-25 NOTE — Progress Notes (Signed)
RT called to assess pts arterial line. Arterial catheter was out and unable to be advanced back into position. Arterial catheter removed. RN at bedside.

## 2020-09-25 NOTE — ED Notes (Signed)
Dr. Ronnald Ramp paged to Vela Prose, RN paged by Levada Dy

## 2020-09-25 NOTE — Anesthesia Procedure Notes (Signed)
Arterial Line Insertion Start/End3/08/2020 10:10 AM, 09/25/2020 10:15 AM Performed by: Rande Brunt, CRNA, CRNA  Patient location: Pre-op. Preanesthetic checklist: patient identified, IV checked, site marked, risks and benefits discussed, surgical consent, monitors and equipment checked, pre-op evaluation, timeout performed and anesthesia consent Lidocaine 1% used for infiltration Right, radial was placed Catheter size: 20 G Hand hygiene performed  and maximum sterile barriers used  Allen's test indicative of satisfactory collateral circulation Attempts: 1 Procedure performed without using ultrasound guided technique. Following insertion, dressing applied and Biopatch. Post procedure assessment: normal and unchanged  Patient tolerated the procedure well with no immediate complications.

## 2020-09-25 NOTE — Progress Notes (Signed)
Pt arrived from ED.  No family at bedside Dr. Fransisco Beau made aware of pt's A-fib with RVR--Rate 120's-140's. Pt not alert currently to self/place/time/situation. 16 gauge IV placed, anesthesia in room to start A-line Unknown Past medical hx of a-fib

## 2020-09-25 NOTE — ED Notes (Signed)
Neurosurgery notified of patient arrival

## 2020-09-25 NOTE — ED Provider Notes (Signed)
  Physical Exam  BP 105/78   Pulse 98   Temp 98.7 F (37.1 C) (Oral)   Resp 18   SpO2 100%   Physical Exam Vitals and nursing note reviewed.  Constitutional:      General: He is not in acute distress.    Appearance: He is well-developed and well-nourished. He is not diaphoretic.  HENT:     Head: Normocephalic and atraumatic.  Eyes:     General: No scleral icterus.    Extraocular Movements: EOM normal.     Conjunctiva/sclera: Conjunctivae normal.  Pulmonary:     Effort: Pulmonary effort is normal. No respiratory distress.  Musculoskeletal:     Cervical back: Normal range of motion.  Skin:    Findings: No rash.  Neurological:     Mental Status: He is alert.  Psychiatric:        Mood and Affect: Mood and affect normal.     ED Course/Procedures   Clinical Course as of 09/25/20 0955  Wed Sep 25, 2020  0941 Nurse has informed neurosurgery of patient's arrival here. [HK]    Clinical Course User Index [HK] Delia Heady, PA-C    Procedures  MDM  70 year old male transferred from [facility] for bilateral subdural hematomas.  He had a fall yesterday.  Initial CT showed 7 mm shift which has somewhat improved to a 5 mm shift.  Patient has been given mannitol, Keppra, Decadron.  He was found to be in A. fib with RVR and was given Cardizem. Patient transferred to [facility] to be evaluated by Dr. Ronnald Ramp, neurosurgery to likely go to the OR. Patient hemodynamically stable here.  He is in A. Fib with rates in the 90s-100s. He is oriented to year. Neurosurgery paged.    Portions of this note were generated with Lobbyist. Dictation errors may occur despite best attempts at proofreading.        Delia Heady, PA-C 09/25/20 0315    Tegeler, Gwenyth Allegra, MD 09/25/20 1430
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
## 2020-09-25 NOTE — Progress Notes (Signed)
Dr. Fransisco Beau updated about pt's VS after receiving Cardizem push.  Order for Metoprolol 5mg  IV push received.

## 2020-09-25 NOTE — Anesthesia Preprocedure Evaluation (Addendum)
Anesthesia Evaluation  Patient identified by MRN, date of birth, ID band Patient awake    Reviewed: Allergy & Precautions, Patient's Chart, lab work & pertinent test results  Airway        Dental   Pulmonary Current Smoker,           Cardiovascular hypertension, Pt. on medications      Neuro/Psych  Headaches,  SDH     GI/Hepatic   Endo/Other    Renal/GU      Musculoskeletal   Abdominal   Peds  Hematology   Anesthesia Other Findings   Reproductive/Obstetrics                             Anesthesia Physical Anesthesia Plan  ASA: II  Anesthesia Plan: General   Post-op Pain Management:    Induction: Intravenous  PONV Risk Score and Plan: 2 and Treatment may vary due to age or medical condition, Ondansetron, Dexamethasone and Aprepitant  Airway Management Planned: Oral ETT  Additional Equipment: None  Intra-op Plan:   Post-operative Plan: Extubation in OR  Informed Consent:   Plan Discussed with: CRNA and Anesthesiologist  Anesthesia Plan Comments:         Anesthesia Quick Evaluation

## 2020-09-25 NOTE — ED Triage Notes (Signed)
Pt arrives via [facility] as transfer from [facility] for subdural hematoma. Pt reportedly fell yesterday, not on thinners reportedly. Initial CT showed 7 mm shift, follow up showed 5 mm shift. Pt received mannitol, keppra, decadron, cardizem, lopressor, lasix prior to arrival.

## 2020-09-25 NOTE — Progress Notes (Signed)
1400:  Patient arrived from short stay.  RN attempted to zero right arterial line, but no wave form.  Assessed arterial line and notice catheter is mostly out under intact dressing.  RN also noticed pressure bag hooked up to peripheral IV. Called RT to come assess arterial line.  Arterial line is out under dressing.  Dressing removed.  Small hematoma noted.
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
## 2020-09-26 ENCOUNTER — Inpatient Hospital Stay (HOSPITAL_COMMUNITY): Payer: Medicare HMO

## 2020-09-26 LAB — ECHOCARDIOGRAM COMPLETE
AR max vel: 1.17 cm2
AV Area VTI: 0.92 cm2
AV Area mean vel: 0.94 cm2
AV Mean grad: 10.5 mmHg
AV Peak grad: 17.4 mmHg
Ao pk vel: 2.08 m/s
Area-P 1/2: 5.02 cm2
S' Lateral: 4.5 cm
Single Plane A4C EF: 41.7 %

## 2020-09-26 MED ORDER — CHLORHEXIDINE GLUCONATE CLOTH 2 % EX PADS
6.0000 | MEDICATED_PAD | Freq: Every day | CUTANEOUS | Status: DC
  Administered 2020-09-26 – 2020-09-27 (×2): 6 via TOPICAL

## 2020-09-26 MED ORDER — PERFLUTREN LIPID MICROSPHERE
1.0000 mL | INTRAVENOUS | Status: AC | PRN
  Administered 2020-09-26: 2 mL via INTRAVENOUS
  Filled 2020-09-26: qty 10

## 2020-09-26 MED ORDER — METOPROLOL SUCCINATE ER 50 MG PO TB24
50.0000 mg | ORAL_TABLET | Freq: Every day | ORAL | Status: DC
  Administered 2020-09-26 – 2020-09-29 (×4): 50 mg via ORAL
  Filled 2020-09-26 (×5): qty 1

## 2020-09-26 NOTE — Progress Notes (Signed)
Inpatient Rehab Admissions Coordinator Note:   Pt was screened for candidacy by Shann Medal, PT, DPT per PT/OT recs.  Note that pt currently with possibility of craniotomy in the near future pending OR availability.  At current level, pt is requiring max +2 for transfers and max assist for ADLs. Per therapy evaluations pt's significant other cannot provide 24/7 assist as she requires a RW for ambulation.  His [employer] Medicare will not approve SNF following CIR admission.  I will follow for post-op therapy progress and if he makes significant improvements, or if other arrangements for 24/7 can be made would recommend CIR consult.  I will rescreen following surgery.   Shann Medal, PT, DPT [phone] 09/26/20 2:06 PM

## 2020-09-26 NOTE — Progress Notes (Signed)
  Echocardiogram 2D Echocardiogram has been performed.  Matilde Bash 09/26/2020, 8:39 AM

## 2020-09-26 NOTE — Progress Notes (Signed)
Subjective: Patient reports no headaches or issues overnight, still confused today  Objective: Vital signs in last 24 hours: Temp:  [97.9 F (36.6 C)-98.7 F (37.1 C)] 98.2 F (36.8 C) (03/03 0400) Pulse Rate:  [25-161] 49 (03/03 0800) Resp:  [0-38] 14 (03/03 0800) BP: (94-172)/(69-121) 94/69 (03/03 0800) SpO2:  [91 %-100 %] 100 % (03/03 0800)  Intake/Output from previous day: 03/02 0701 - 03/03 0700 In: 203.1 [I.V.:203.1] Out: 1000 [Urine:1000] Intake/Output this shift: Total I/O In: 14.5 [I.V.:14.5] Out: -   Neurologic: Grossly normal, no aphasia or drift, still confused but answers some questions appropriately.   Lab Results: No results found for: WBC, HGB, HCT, MCV, PLT No results found for: INR, PROTIME BMET No results found for: NA, K, CL, CO2, GLUCOSE, BUN, CREATININE, CALCIUM  Studies/Results: No results found.  Assessment/Plan: 70 year old male admitted for bilateral subdural hematoma. The patient is wide awake and conversing appropriately but yet confused. No acute events overnight. After discussing surgery today he has changed his mind and is wanting to have it done now. Unfortunately the difficult decision now is trying to find a time in the OR. Will have a discussion with his family at bedside. HR under control now with Cardizem gtt.    LOS: 1 day    Mike Thomas 09/26/2020, 8:06 AM
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
## 2020-09-26 NOTE — Progress Notes (Signed)
Subjective:  Denies any complaints Remains confused  Objective:  Vital Signs in the last 24 hours: Temp:  [97.6 F (36.4 C)-98.4 F (36.9 C)] 97.6 F (36.4 C) (03/03 1200) Pulse Rate:  [25-161] 71 (03/03 1000) Resp:  [0-38] 20 (03/03 1000) BP: (91-172)/(68-121) 91/68 (03/03 1000) SpO2:  [91 %-100 %] 96 % (03/03 1000)  Intake/Output from previous day: 03/02 0701 - 03/03 0700 In: 203.1 [I.V.:203.1] Out: 1000 [Urine:1000]  Physical Exam Vitals and nursing note reviewed.  Constitutional:      General: He is not in acute distress.    Appearance: He is well-developed.  HENT:     Head: Normocephalic and atraumatic.  Eyes:     Conjunctiva/sclera: Conjunctivae normal.     Pupils: Pupils are equal, round, and reactive to light.  Neck:     Vascular: No JVD.  Cardiovascular:     Rate and Rhythm: Normal rate. Rhythm irregular.     Pulses: Normal pulses and intact distal pulses.     Heart sounds: No murmur heard.   Pulmonary:     Effort: Pulmonary effort is normal.     Breath sounds: Normal breath sounds. No wheezing or rales.  Abdominal:     General: Bowel sounds are normal.     Palpations: Abdomen is soft.     Tenderness: There is no rebound.  Musculoskeletal:        General: No tenderness. Normal range of motion.     Right lower leg: No edema.     Left lower leg: No edema.  Lymphadenopathy:     Cervical: No cervical adenopathy.  Skin:    General: Skin is warm and dry.  Neurological:     Mental Status: He is alert and oriented to person, place, and time.     Cranial Nerves: No cranial nerve deficit.     Comments: Oriented, but confused      Skin TSH Recent Labs    09/25/20 1833  TSH 1.660    CARDIAC STUDIES:  Echocardiogram 09/26/2020: 1. Mildly dilated left ventricule with normal wall thickness. Severe  global hypokinesis, LVEF 25 to 30%. No LV thrombus seen. Left ventricular diastolic  parameters are indeterminate due to atrial fibrillation.  2. Right  ventricular systolic function is low normal. The right  ventricular size is normal.  3. Left atrial size was mildly dilated.  4. Right atrial size was mildly dilated.  5. The mitral valve is grossly normal. Mild mitral valve regurgitation.  6. Likely bicuspid aortic valve with severe calcification with low flow low gradient aortic stenosis.Aortic valve area, by VTI measures 0.92 cm. Aortic valve Vmax  measures 2.08 m/s.   EKG 09/25/2020: A. fib with RVR Possible old anterior infarct, poor R wave progression Long QT interval  Echocardiogram: Not available  Chest x-ray 09/24/2020 (performed at [facility]): No acute intrathoracic process  Reviewed [facility] labs 09/24/2020: Glucose 110, BUN/Cr 15/0.93. EGFR 84. Na/K 140/3.5. Rest of the CMP normal H/H 15/46. MCV 98. Platelets 197 HbA1C 6.1% proBNP 7124 (0-125) TSH N/A  09/13/2019: Chol 209, TG 128, HDL 65, LDL 118    Assessment & Recommendations:  70 y.o. Caucasian male  with hypertension, hyperlipidemia, admitted with bilateral subdural hematomas, found to have A. fib, HFrEF, low-flow low gradient aortic stenosis  A. Fib: RVR.  Chronicity unclear. Now rate controlled.  Given new diagnosis of systolic heart failure, recommend stopping IV diltiazem, and starting metoprolol succinate 50 mg daily instead. CHA2DS2VASc score at least 2, annual stroke risk  at least 2%  Obviously, he is not a candidate for anticoagulation at this time due to his subdural hematoma  HFrEF, low-flow low gradient aortic stenosis: Differential remains wide including ischemic or nonischemic cardiomyopathy, as well as valvular cardiomyopathy. Recommend metoprolol succinate 50 mg daily for now. Rest of the work-up most likely outpatient after his recovery from subdural hematoma  Preoperative stratification: His perioperative cardiac risk is elevated, but not prohibitive, due to HFrEF and low flow low gradient aortic  stenosis. Perioperatively, could use IV amiodarone for rate control of his atrial fibrillation. Avoid hypotension  Thank you for the consult.  We will continue to follow the patient.  Nigel Mormon, MD Pager: [phone] Office: [phone]

## 2020-09-27 ENCOUNTER — Inpatient Hospital Stay (HOSPITAL_COMMUNITY)
Admission: EM | Disposition: A | Discharge status: Rehabilitation Facility | Payer: Self-pay | Source: Other Acute Inpatient Hospital | Attending: Neurological Surgery

## 2020-09-27 ENCOUNTER — Inpatient Hospital Stay (HOSPITAL_COMMUNITY): Payer: Medicare HMO | Admitting: Anesthesiology

## 2020-09-27 ENCOUNTER — Encounter (HOSPITAL_COMMUNITY): Payer: Self-pay | Admitting: Neurological Surgery

## 2020-09-27 DIAGNOSIS — Z9889 Other specified postprocedural states: Secondary | ICD-10-CM

## 2020-09-27 DIAGNOSIS — S065X9A Traumatic subdural hemorrhage with loss of consciousness of unspecified duration, initial encounter: Secondary | ICD-10-CM | POA: Diagnosis present

## 2020-09-27 DIAGNOSIS — S065XAA Traumatic subdural hemorrhage with loss of consciousness status unknown, initial encounter: Secondary | ICD-10-CM | POA: Diagnosis present

## 2020-09-27 HISTORY — PX: CRANIOTOMY: SHX93

## 2020-09-27 LAB — CBC
HCT: 47.7 % (ref 39.0–52.0)
Hemoglobin: 15.7 g/dL (ref 13.0–17.0)
MCH: 33.4 pg (ref 26.0–34.0)
MCHC: 32.9 g/dL (ref 30.0–36.0)
MCV: 101.5 fL — ABNORMAL HIGH (ref 80.0–100.0)
Platelets: 183 10*3/uL (ref 150–400)
RBC: 4.7 MIL/uL (ref 4.22–5.81)
RDW: 13.6 % (ref 11.5–15.5)
WBC: 7.5 10*3/uL (ref 4.0–10.5)
nRBC: 0 % (ref 0.0–0.2)

## 2020-09-27 LAB — BASIC METABOLIC PANEL
Anion gap: 10 (ref 5–15)
BUN: 22 mg/dL (ref 8–23)
CO2: 25 mmol/L (ref 22–32)
Calcium: 9.2 mg/dL (ref 8.9–10.3)
Chloride: 106 mmol/L (ref 98–111)
Creatinine, Ser: 1.05 mg/dL (ref 0.61–1.24)
GFR, Estimated: >60 mL/min (ref >60)
Glucose, Bld: 110 mg/dL — ABNORMAL HIGH (ref 70–99)
Potassium: 3.5 mmol/L (ref 3.5–5.1)
Sodium: 141 mmol/L (ref 135–145)

## 2020-09-27 LAB — SURGICAL PCR SCREEN
MRSA, PCR: NEGATIVE
Staphylococcus aureus: NEGATIVE

## 2020-09-27 LAB — PROTIME-INR
INR: 1.1 (ref 0.8–1.2)
Prothrombin Time: 13.4 seconds (ref 11.4–15.2)

## 2020-09-27 SURGERY — CRANIOTOMY HEMATOMA EVACUATION SUBDURAL
Anesthesia: General | Site: Head | Laterality: Bilateral

## 2020-09-27 MED ORDER — SUGAMMADEX SODIUM 200 MG/2ML IV SOLN
INTRAVENOUS | Status: DC | PRN
  Administered 2020-09-27: 200 mg via INTRAVENOUS

## 2020-09-27 MED ORDER — ONDANSETRON HCL 4 MG/2ML IJ SOLN: 4.0000 mg | INTRAMUSCULAR | Status: DC | PRN

## 2020-09-27 MED ORDER — ACETAMINOPHEN 650 MG RE SUPP: 650.0000 mg | RECTAL | Status: DC | PRN

## 2020-09-27 MED ORDER — ETOMIDATE 2 MG/ML IV SOLN
INTRAVENOUS | Status: DC | PRN
  Administered 2020-09-27: 140 mg via INTRAVENOUS

## 2020-09-27 MED ORDER — HYDROCODONE-ACETAMINOPHEN 5-325 MG PO TABS: 1.0000 | ORAL_TABLET | ORAL | Status: DC | PRN

## 2020-09-27 MED ORDER — ONDANSETRON HCL 4 MG PO TABS: 4.0000 mg | ORAL_TABLET | ORAL | Status: DC | PRN

## 2020-09-27 MED ORDER — CEFAZOLIN SODIUM-DEXTROSE 2-3 GM-%(50ML) IV SOLR
INTRAVENOUS | Status: DC | PRN
  Administered 2020-09-27: 2 g via INTRAVENOUS

## 2020-09-27 MED ORDER — ACETAMINOPHEN 325 MG PO TABS
650.0000 mg | ORAL_TABLET | ORAL | Status: DC | PRN
  Administered 2020-09-29 (×3): 650 mg via ORAL
  Filled 2020-09-27 (×3): qty 2

## 2020-09-27 MED ORDER — VASOPRESSIN 20 UNIT/ML IV SOLN
INTRAVENOUS | Status: AC
  Filled 2020-09-27: qty 1

## 2020-09-27 MED ORDER — FENTANYL CITRATE (PF) 250 MCG/5ML IJ SOLN
INTRAMUSCULAR | Status: DC | PRN
  Administered 2020-09-27: 50 ug via INTRAVENOUS
  Administered 2020-09-27: 100 ug via INTRAVENOUS

## 2020-09-27 MED ORDER — SODIUM CHLORIDE 0.9 % IV SOLN: INTRAVENOUS | Status: DC

## 2020-09-27 MED ORDER — ROCURONIUM BROMIDE 10 MG/ML (PF) SYRINGE
PREFILLED_SYRINGE | INTRAVENOUS | Status: DC | PRN
  Administered 2020-09-27: 80 mg via INTRAVENOUS

## 2020-09-27 MED ORDER — FENTANYL CITRATE (PF) 250 MCG/5ML IJ SOLN
INTRAMUSCULAR | Status: AC
  Filled 2020-09-27: qty 5

## 2020-09-27 MED ORDER — LEVETIRACETAM IN NACL 500 MG/100ML IV SOLN: 500.0000 mg | Freq: Two times a day (BID) | INTRAVENOUS | Status: DC

## 2020-09-27 MED ORDER — LEVETIRACETAM IN NACL 500 MG/100ML IV SOLN
500.0000 mg | Freq: Two times a day (BID) | INTRAVENOUS | Status: DC
  Administered 2020-09-27 – 2020-09-29 (×4): 500 mg via INTRAVENOUS
  Filled 2020-09-27 (×4): qty 100

## 2020-09-27 MED ORDER — LACTATED RINGERS IV SOLN: INTRAVENOUS | Status: DC

## 2020-09-27 MED ORDER — ESMOLOL HCL 100 MG/10ML IV SOLN
INTRAVENOUS | Status: DC | PRN
  Administered 2020-09-27: 20 mg via INTRAVENOUS

## 2020-09-27 MED ORDER — LIDOCAINE-EPINEPHRINE 1 %-1:100000 IJ SOLN
INTRAMUSCULAR | Status: DC | PRN
  Administered 2020-09-27 (×2): 10 mL

## 2020-09-27 MED ORDER — LIDOCAINE 2% (20 MG/ML) 5 ML SYRINGE
INTRAMUSCULAR | Status: DC | PRN
  Administered 2020-09-27: 100 mg via INTRAVENOUS

## 2020-09-27 MED ORDER — HEMOSTATIC AGENTS (NO CHARGE) OPTIME
TOPICAL | Status: DC | PRN
  Administered 2020-09-27 (×2): 1 via TOPICAL

## 2020-09-27 MED ORDER — CHLORHEXIDINE GLUCONATE 0.12 % MT SOLN
OROMUCOSAL | Status: AC
  Administered 2020-09-27: 15 mL via OROMUCOSAL
  Filled 2020-09-27: qty 15

## 2020-09-27 MED ORDER — THROMBIN 20000 UNITS EX SOLR
CUTANEOUS | Status: AC
  Filled 2020-09-27: qty 20000

## 2020-09-27 MED ORDER — PANTOPRAZOLE SODIUM 40 MG IV SOLR: 40.0000 mg | Freq: Every day | INTRAVENOUS | Status: DC

## 2020-09-27 MED ORDER — HYDROCODONE-ACETAMINOPHEN 5-325 MG PO TABS
1.0000 | ORAL_TABLET | ORAL | Status: DC | PRN
  Administered 2020-09-27 – 2020-10-03 (×9): 1 via ORAL
  Filled 2020-09-27 (×10): qty 1

## 2020-09-27 MED ORDER — LABETALOL HCL 5 MG/ML IV SOLN: 10.0000 mg | INTRAVENOUS | Status: DC | PRN

## 2020-09-27 MED ORDER — TAMSULOSIN HCL 0.4 MG PO CAPS
0.4000 mg | ORAL_CAPSULE | Freq: Every day | ORAL | Status: DC
  Administered 2020-09-28 – 2020-10-02 (×5): 0.4 mg via ORAL
  Filled 2020-09-27 (×6): qty 1

## 2020-09-27 MED ORDER — PROMETHAZINE HCL 25 MG PO TABS
12.5000 mg | ORAL_TABLET | ORAL | Status: DC | PRN
  Filled 2020-09-27: qty 1

## 2020-09-27 MED ORDER — DEXAMETHASONE SODIUM PHOSPHATE 10 MG/ML IJ SOLN
6.0000 mg | Freq: Four times a day (QID) | INTRAMUSCULAR | Status: AC
  Administered 2020-09-28 (×4): 6 mg via INTRAVENOUS
  Filled 2020-09-27 (×4): qty 1

## 2020-09-27 MED ORDER — THROMBIN 20000 UNITS EX SOLR: CUTANEOUS | Status: DC | PRN

## 2020-09-27 MED ORDER — ACETAMINOPHEN 325 MG PO TABS: 650.0000 mg | ORAL_TABLET | ORAL | Status: DC | PRN

## 2020-09-27 MED ORDER — PROMETHAZINE HCL 25 MG PO TABS: 12.5000 mg | ORAL_TABLET | ORAL | Status: DC | PRN

## 2020-09-27 MED ORDER — POTASSIUM CHLORIDE IN NACL 20-0.9 MEQ/L-% IV SOLN
INTRAVENOUS | Status: DC
  Filled 2020-09-27 (×4): qty 1000

## 2020-09-27 MED ORDER — THROMBIN (RECOMBINANT) 5000 UNITS EX SOLR
CUTANEOUS | Status: AC
  Filled 2020-09-27: qty 5000

## 2020-09-27 MED ORDER — SENNA 8.6 MG PO TABS
1.0000 | ORAL_TABLET | Freq: Two times a day (BID) | ORAL | Status: DC
  Administered 2020-09-27 – 2020-10-03 (×12): 8.6 mg via ORAL
  Filled 2020-09-27 (×12): qty 1

## 2020-09-27 MED ORDER — CEFAZOLIN SODIUM-DEXTROSE 2-4 GM/100ML-% IV SOLN
INTRAVENOUS | Status: AC
  Filled 2020-09-27: qty 100

## 2020-09-27 MED ORDER — PANTOPRAZOLE SODIUM 40 MG IV SOLR
40.0000 mg | Freq: Every day | INTRAVENOUS | Status: DC
  Administered 2020-09-27 – 2020-09-28 (×2): 40 mg via INTRAVENOUS
  Filled 2020-09-27 (×2): qty 40

## 2020-09-27 MED ORDER — BACITRACIN ZINC 500 UNIT/GM EX OINT
TOPICAL_OINTMENT | CUTANEOUS | Status: AC
  Filled 2020-09-27: qty 28.35

## 2020-09-27 MED ORDER — LIDOCAINE-EPINEPHRINE 1 %-1:100000 IJ SOLN
INTRAMUSCULAR | Status: AC
  Filled 2020-09-27: qty 1

## 2020-09-27 MED ORDER — CEFAZOLIN SODIUM-DEXTROSE 1-4 GM/50ML-% IV SOLN
1.0000 g | Freq: Three times a day (TID) | INTRAVENOUS | Status: AC
  Administered 2020-09-28 (×2): 1 g via INTRAVENOUS
  Filled 2020-09-27 (×4): qty 50

## 2020-09-27 MED ORDER — DEXAMETHASONE SODIUM PHOSPHATE 4 MG/ML IJ SOLN
4.0000 mg | Freq: Four times a day (QID) | INTRAMUSCULAR | Status: AC
  Administered 2020-09-29 (×4): 4 mg via INTRAVENOUS
  Filled 2020-09-27 (×4): qty 1

## 2020-09-27 MED ORDER — AMIODARONE HCL IN DEXTROSE 360-4.14 MG/200ML-% IV SOLN
30.0000 mg/h | INTRAVENOUS | Status: DC
  Administered 2020-09-28: 30 mg/h via INTRAVENOUS
  Filled 2020-09-27 (×2): qty 200

## 2020-09-27 MED ORDER — DEXAMETHASONE SODIUM PHOSPHATE 10 MG/ML IJ SOLN
INTRAMUSCULAR | Status: DC | PRN
  Administered 2020-09-27: 10 mg via INTRAVENOUS

## 2020-09-27 MED ORDER — PHENYLEPHRINE HCL-NACL 10-0.9 MG/250ML-% IV SOLN
INTRAVENOUS | Status: DC | PRN
  Administered 2020-09-27: 50 ug/min via INTRAVENOUS

## 2020-09-27 MED ORDER — SODIUM CHLORIDE 0.9 % IV SOLN: INTRAVENOUS | Status: DC | PRN

## 2020-09-27 MED ORDER — CHLORHEXIDINE GLUCONATE 0.12 % MT SOLN: 15.0000 mL | Freq: Once | OROMUCOSAL | Status: AC

## 2020-09-27 MED ORDER — AMIODARONE HCL IN DEXTROSE 360-4.14 MG/200ML-% IV SOLN
60.0000 mg/h | INTRAVENOUS | Status: AC
  Administered 2020-09-27: 60 mg/h via INTRAVENOUS
  Filled 2020-09-27 (×2): qty 200

## 2020-09-27 MED ORDER — BACITRACIN ZINC 500 UNIT/GM EX OINT
TOPICAL_OINTMENT | CUTANEOUS | Status: DC | PRN
  Administered 2020-09-27: 1 via TOPICAL

## 2020-09-27 MED ORDER — PROPOFOL 10 MG/ML IV BOLUS
INTRAVENOUS | Status: AC
  Filled 2020-09-27: qty 20

## 2020-09-27 MED ORDER — ONDANSETRON HCL 4 MG/2ML IJ SOLN
INTRAMUSCULAR | Status: DC | PRN
  Administered 2020-09-27: 4 mg via INTRAVENOUS

## 2020-09-27 MED ORDER — VASOPRESSIN 20 UNIT/ML IV SOLN
INTRAVENOUS | Status: DC | PRN
  Administered 2020-09-27 (×2): 2 [IU] via INTRAVENOUS

## 2020-09-27 MED ORDER — DEXAMETHASONE SODIUM PHOSPHATE 4 MG/ML IJ SOLN
4.0000 mg | Freq: Three times a day (TID) | INTRAMUSCULAR | Status: DC
  Administered 2020-09-29 – 2020-09-30 (×2): 4 mg via INTRAVENOUS
  Filled 2020-09-27 (×2): qty 1

## 2020-09-27 MED ORDER — ORAL CARE MOUTH RINSE: 15.0000 mL | Freq: Once | OROMUCOSAL | Status: AC

## 2020-09-27 MED ORDER — ALBUMIN HUMAN 5 % IV SOLN: INTRAVENOUS | Status: DC | PRN

## 2020-09-27 MED ORDER — MORPHINE SULFATE (PF) 2 MG/ML IV SOLN
1.0000 mg | INTRAVENOUS | Status: DC | PRN
  Administered 2020-09-28 – 2020-09-30 (×4): 2 mg via INTRAVENOUS
  Administered 2020-10-02 – 2020-10-03 (×2): 1 mg via INTRAVENOUS
  Filled 2020-09-27 (×6): qty 1

## 2020-09-27 MED ORDER — THROMBIN 5000 UNITS EX SOLR: OROMUCOSAL | Status: DC | PRN

## 2020-09-27 MED ORDER — CHLORHEXIDINE GLUCONATE CLOTH 2 % EX PADS
6.0000 | MEDICATED_PAD | Freq: Every day | CUTANEOUS | Status: DC
  Administered 2020-09-28 – 2020-09-30 (×3): 6 via TOPICAL

## 2020-09-27 SURGICAL SUPPLY — 46 items
BIT DRILL WIRE PASS 1.3MM (BIT) ×1 IMPLANT
BUR SPIRAL ROUTER 2.3 (BUR) ×2 IMPLANT
CABLE BIPOLOR RESECTION CORD (MISCELLANEOUS) ×2 IMPLANT
CANISTER SUCT 3000ML PPV (MISCELLANEOUS) ×4 IMPLANT
CATH VENTRIC 35X38 W/TROCAR LG (CATHETERS) ×4 IMPLANT
CLIP VESOCCLUDE MED 6/CT (CLIP) IMPLANT
DRAPE INCISE IOBAN 85X60 (DRAPES) ×2 IMPLANT
DRAPE NEUROLOGICAL W/INCISE (DRAPES) ×4 IMPLANT
DRAPE WARM FLUID 44X44 (DRAPES) ×2 IMPLANT
DRILL WIRE PASS 1.3MM (BIT) ×2
DURAPREP 6ML APPLICATOR 50/CS (WOUND CARE) ×4 IMPLANT
ELECT PENCIL ROCKER SW 15FT (MISCELLANEOUS) ×2 IMPLANT
ELECT REM PT RETURN 9FT ADLT (ELECTROSURGICAL) ×2
ELECTRODE REM PT RTRN 9FT ADLT (ELECTROSURGICAL) ×1 IMPLANT
GAUZE SPONGE 4X4 12PLY STRL (GAUZE/BANDAGES/DRESSINGS) ×4 IMPLANT
GLOVE BIO SURGEON STRL SZ7 (GLOVE) ×6 IMPLANT
GLOVE BIO SURGEON STRL SZ8 (GLOVE) ×8 IMPLANT
GLOVE SURG UNDER POLY LF SZ7 (GLOVE) ×6 IMPLANT
GOWN STRL REUS W/ TWL LRG LVL3 (GOWN DISPOSABLE) ×3 IMPLANT
GOWN STRL REUS W/ TWL XL LVL3 (GOWN DISPOSABLE) ×3 IMPLANT
GOWN STRL REUS W/TWL LRG LVL3 (GOWN DISPOSABLE) ×6
GOWN STRL REUS W/TWL XL LVL3 (GOWN DISPOSABLE) ×6
HEMOSTAT POWDER KIT SURGIFOAM (HEMOSTASIS) ×2 IMPLANT
KIT BASIN OR (CUSTOM PROCEDURE TRAY) ×2 IMPLANT
KIT DRAIN CSF ACCUDRAIN (MISCELLANEOUS) ×2 IMPLANT
KIT TURNOVER KIT B (KITS) ×2 IMPLANT
NEEDLE HYPO 22GX1.5 SAFETY (NEEDLE) ×2 IMPLANT
NS IRRIG 1000ML POUR BTL (IV SOLUTION) ×6 IMPLANT
PACK CRANIOTOMY CUSTOM (CUSTOM PROCEDURE TRAY) ×2 IMPLANT
PERFORATOR LRG  14-11MM (BIT) ×1
PERFORATOR LRG 14-11MM (BIT) ×1 IMPLANT
PIN MAYFIELD SKULL DISP (PIN) ×2 IMPLANT
PLATE BONE 12 2H TARGET XL (Plate) ×12 IMPLANT
PLATE CRANIAL SHUNT 14 (Plate) ×2 IMPLANT
PLATE SMALL MALL SUBOCC UNI II (Plate) ×2 IMPLANT
SCREW SD AXS 1.5X3 (Screw) ×14 IMPLANT
SCREW UNIII AXS SD 1.5X4 (Screw) ×10 IMPLANT
SPONGE SURGIFOAM ABS GEL 100 (HEMOSTASIS) ×6 IMPLANT
STAPLER VISISTAT 35W (STAPLE) ×4 IMPLANT
SUT NURALON 4 0 TR CR/8 (SUTURE) ×6 IMPLANT
SUT VIC AB 2-0 CP2 18 (SUTURE) ×8 IMPLANT
SYR CONTROL 10ML LL (SYRINGE) ×4 IMPLANT
TAPE CLOTH SURG 4X10 WHT LF (GAUZE/BANDAGES/DRESSINGS) ×4 IMPLANT
TOWEL GREEN STERILE (TOWEL DISPOSABLE) ×4 IMPLANT
TOWEL GREEN STERILE FF (TOWEL DISPOSABLE) ×4 IMPLANT
WATER STERILE IRR 1000ML POUR (IV SOLUTION) ×2 IMPLANT

## 2020-09-27 NOTE — Op Note (Signed)
09/27/2020  7:32 PM  PATIENT:  Mike Thomas  70 y.o. 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 Imaging showed lateral subdural hematomas.  Recommended bilateral craniotomy for evacuation of subdural hematomas. Patient understood the risks, benefits, and alternatives and potential outcomes and wished to proceed.  PROCEDURE DETAILS: The patient was taken to the operating room and after induction of adequate generalized endotracheal anesthesia, the head was affixed in a 3 point [place] head rest, and turned to the left to expose the right frontotemporal parietal region. The head was shaved and then cleaned and then prepped with DuraPrep and draped in the usual sterile fashion. 10 cc of local anesthetic was injected, and a curvilinear incision was made on the right of the head. Raney clips were placed to establish hemostasis of the scalp, the muscle was reflected with the scalp flap, to expose the right parietal region. A burr hole was placed, and a craniotomy flap was turned utilizing the high-speed, air powered drill. The flap was then placed in bacitracin-containing saline solution, and the dura was opened to expose a right subacute to chronic subdural hematoma. A hematoma was then removed with a combination of irrigation and suction. I continued to irrigate until the irrigant was clear to, and dried any bleeding with bipolar cautery.  We opened the thin membrane overlying the brain and fenestrated it.  I then placed a subdural drain through separate stab  incision and close the dura with a running 4-0 Nurolon suture. Dural tack up sutures were placed. The dura was lined with Gelfoam, and the craniotomy flap was replaced with doggie-bone plates. The wound was copiously irrigated. A subgaleal drain was placed, and the galea was then closed with interrupted 2-0 Vicryl suture. The skin was then closed with staples a sterile dressing was applied.  The head was then turned to the right to expose the left parietal region and the Mayfield clamp was rotated.  We then planned our left-sided craniotomy   The head was shaved and then cleaned and then prepped with DuraPrep and draped in the usual sterile fashion. 10 cc of local anesthetic was injected, and a curvilinear incision was made on the left of the head. Raney clips were placed to establish hemostasis of the scalp, the muscle was reflected with the scalp flap, to expose the left parietal region. A burr hole was placed, and a craniotomy flap was turned utilizing the high-speed, air powered drill. The flap was then placed in bacitracin-containing saline solution, and the dura was opened to expose the left-sided subacute to chronic subdural hematoma. A hematoma was then removed with a combination of irrigation and suction. I continued to irrigate until the irrigant was clear to, and dried any bleeding with bipolar cautery.  Again, the thick membrane overlying the brain was opened and fenestrated.  I then placed a subdural drain through separate stab incision and close the dura with a running 4-0 Nurolon suture. Dural tack up sutures were placed. The dura was lined with Gelfoam, and the craniotomy flap was replaced with doggie-bone plates. The wound was copiously irrigated. A subgaleal  drain was placed, and the galea was then closed with interrupted 2-0 Vicryl suture. The skin was then closed with staples a sterile dressing was applied. The patient was then taken out of the 3-point Mayfield headrest and awakened from  general anesthesia, and transported to the recovery room in stable condition. At the end of the procedure all sponge, needle, and instrument counts were correct.    PLAN OF CARE: Admit to inpatient   PATIENT DISPOSITION:  ICU - extubated and stable.   Delay start of Pharmacological VTE agent (>24hrs) due to surgical blood loss or risk of bleeding:  yes

## 2020-09-27 NOTE — Transfer of Care (Signed)
Immediate Anesthesia Transfer of Care Note  Patient: Mike Thomas  Procedure(s) Performed: BILATERAL CRANIOTOMY FOR SUBDURAL HEMATOMA EVACUATION (Bilateral Head)  Patient Location: PACU  Anesthesia Type:General  Level of Consciousness: awake  Airway & Oxygen Therapy: Patient Spontanous Breathing  Post-op Assessment: Report given to RN and Post -op Vital signs reviewed and stable  Post vital signs: Reviewed and stable  Last Vitals:  Vitals Value Taken Time  BP 134/98 09/27/20 1953  Temp    Pulse    Resp 24 09/27/20 1955  SpO2    Vitals shown include unvalidated device data.  Last Pain:  Vitals:   09/27/20 1600  TempSrc: Oral  PainSc:          Complications: No complications documented.

## 2020-09-27 NOTE — Anesthesia Procedure Notes (Signed)
Procedure Name: Intubation Date/Time: 09/27/2020 5:20 PM Performed by: Clearnce Sorrel, CRNA Pre-anesthesia Checklist: Patient identified, Emergency Drugs available, Suction available, Patient being monitored and Timeout performed Patient Re-evaluated:Patient Re-evaluated prior to induction Oxygen Delivery Method: Circle system utilized Preoxygenation: Pre-oxygenation with 100% oxygen Induction Type: IV induction Ventilation: Mask ventilation without difficulty and Two handed mask ventilation required Laryngoscope Size: Mac and 4 Grade View: Grade I Tube type: Oral Tube size: 7.5 mm Number of attempts: 1 Placement Confirmation: ETT inserted through vocal cords under direct vision,  positive ETCO2 and breath sounds checked- equal and bilateral Secured at: 23 cm Tube secured with: Tape Dental Injury: Teeth and Oropharynx as per pre-operative assessment

## 2020-09-27 NOTE — Anesthesia Postprocedure Evaluation (Signed)
Anesthesia Post Note  Patient: Rayshad Riviello  Procedure(s) Performed: BILATERAL CRANIOTOMY FOR SUBDURAL HEMATOMA EVACUATION (Bilateral Head)     Patient location during evaluation: PACU Anesthesia Type: General Level of consciousness: awake and alert, oriented and patient cooperative Pain management: pain level controlled Vital Signs Assessment: post-procedure vital signs reviewed and stable Respiratory status: spontaneous breathing, nonlabored ventilation, respiratory function stable and patient connected to nasal cannula oxygen Cardiovascular status: blood pressure returned to baseline and stable Postop Assessment: no apparent nausea or vomiting Anesthetic complications: no   No complications documented.  Last Vitals:  Vitals:   09/27/20 2000 09/27/20 2010  BP: 115/90   Pulse: 85 65  Resp: 14 14  Temp: 36.6 C 36.7 C  SpO2: 97% 100%    Last Pain:  Vitals:   09/27/20 2010  TempSrc:   PainSc: 0-No pain                 JACKSON,E. CARSWELL

## 2020-09-27 NOTE — Progress Notes (Signed)
Subjective: Patient reports some left-sided headache today.  It is sharp and intermittent.  No visual changes or numbness or tingling or weakness.  He states he is ready to "get these things out."  Objective: Vital signs in last 24 hours: Temp:  [97.5 F (36.4 C)-98 F (36.7 C)] 98 F (36.7 C) (03/04 0800) Pulse Rate:  [40-117] 48 (03/04 0900) Resp:  [0-24] 12 (03/04 0900) BP: (86-135)/(54-107) 108/87 (03/04 0900) SpO2:  [90 %-100 %] 100 % (03/04 0900)  Intake/Output from previous day: 03/03 0701 - 03/04 0700 In: 538.3 [P.O.:480; I.V.:58.3] Out: 950 [Urine:950] Intake/Output this shift: No intake/output data recorded.  He is awake and alert but he has some confusion and some disorientation but a nonfocal motor exam.  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 [email or web address] Electronically Amended 09/26/2020, 12:13 PM   Final (Amended)    Result Date: 09/26/2020    ECHOCARDIOGRAM REPORT   Patient Name:   Phenix Manges Date of Exam: 09/26/2020 Medical Rec #:  [id number]     Height:       71.0 in Accession #:    [id number]    Weight:       194.0 lb Date of Birth:  02/14/1951      BSA:          2.081 m Patient Age:    70 years      BP:           172/95 mmHg Patient Gender: M             HR:           65 bpm. Exam Location:  Inpatient Procedure: 2D Echo, Cardiac Doppler and Color Doppler Indications:     Atrial fibrillation  History:          Patient has no prior history of Echocardiogram examinations.                  Arrythmias:Atrial Fibrillation; Signs/Symptoms:Altered Mental                  Status. Bilateral subdural hematoma.  Sonographer:     Dustin Flock RDCS Referring Phys:  [id number] [facility] J PATWARDHAN Diagnosing Phys: Vernell Leep MD  Sonographer Comments: No subcostal window. Confusion. Patient in restraints IMPRESSIONS  1. Mildly dilated left ventricule with normal wall thickness. Severe global hypokinesis, LVEF 25-Left ventricular ejection fraction, by estimation, is 25 to 30%. No LV thrombus seen. Left ventricular diastolic parameters are indeterminate due to atrial fibrillation.  2. Right ventricular systolic function is low normal. The right ventricular size is normal.  3. Left atrial size was mildly dilated.  4. Right atrial size was mildly dilated.  5. The mitral valve is grossly normal. Mild mitral valve regurgitation.  6. The aortic valve is abnormal. Aortic valve regurgitation is not visualized. Aortic valve area, by VTI measures  0.92 cm. Aortic valve Vmax measures 2.08 m/s. FINDINGS  Left Ventricle: Left ventricular ejection fraction, by estimation, is 25 to 30%. The left ventricle has severely decreased function. The left ventricle demonstrates global hypokinesis. The left ventricular internal cavity size was mildly dilated. There is no left ventricular hypertrophy. Left ventricular diastolic parameters are indeterminate. Right Ventricle: The right ventricular size is normal. No increase in right ventricular wall thickness. Right ventricular systolic function is low normal. Left Atrium: Left atrial size was mildly dilated. Right Atrium: Right atrial size was mildly dilated. Pericardium: There is no evidence of pericardial effusion. Mitral Valve: The mitral valve is grossly normal. Mild mitral valve regurgitation. Tricuspid Valve: The tricuspid valve is grossly normal. Tricuspid valve regurgitation is mild. Aortic Valve:  Likely bicuspid aortic valve with severe calcification. 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 The aortic valve is abnormal. Aortic valve regurgitation is not visualized. Aortic valve mean gradient measures 10.5 mmHg. Aortic valve peak gradient measures 17.4 mmHg. Aortic valve area, by VTI measures 0.92 cm. Pulmonic Valve: The pulmonic valve was grossly normal. Pulmonic valve regurgitation is not visualized. Aorta: The aortic root and ascending aorta are structurally normal, with no evidence of dilitation. Venous: The inferior vena cava was not well visualized. IAS/Shunts: No atrial level shunt detected by color flow Doppler.  LEFT VENTRICLE PLAX 2D LVIDd:         5.10 cm      Diastology LVIDs:         4.50 cm      LV e' medial:    4.35 cm/s LV PW:         1.10 cm      LV E/e' medial:  23.0 LV IVS:        1.10 cm      LV e' lateral:   3.59 cm/s LVOT diam:     2.40 cm      LV E/e' lateral: 27.9 LV SV:         33 LV SV Index:   16 LVOT Area:     4.52 cm  LV Volumes (MOD) LV vol d, MOD A4C: 192.0 ml LV vol s, MOD A4C: 112.0 ml LV SV MOD A4C:     192.0 ml RIGHT VENTRICLE RV Basal diam:  3.30 cm RV S prime:     6.09 cm/s TAPSE (M-mode): 3.4 cm LEFT ATRIUM             Index       RIGHT ATRIUM           Index LA diam:        4.30 cm 2.07 cm/m  RA Area:     26.70 cm LA Vol (A2C):   61.9 ml 29.74 ml/m RA Volume:   79.40 ml  38.15 ml/m LA Vol (A4C):   85.2 ml 40.93 ml/m LA Biplane Vol: 74.8 ml 35.94 ml/m  AORTIC VALVE AV Area (Vmax):    1.17 cm AV Area (Vmean):   0.94 cm AV Area (VTI):     0.92 cm AV Vmax:           208.33 cm/s AV Vmean:          149.500 cm/s AV VTI:            0.361 m AV Peak Grad:      17.4 mmHg AV Mean Grad:      10.5 mmHg LVOT Vmax:         53.90 cm/s LVOT Vmean:  31.200 cm/s LVOT VTI:          0.073 m LVOT/AV VTI ratio: 0.20  AORTA Ao Root diam: 3.50 cm MITRAL VALVE                TRICUSPID VALVE MV Area (PHT): 5.02 cm     TR Peak grad:   26.0 mmHg MV Decel Time: 151 msec     TR  Vmax:        255.00 cm/s MV E velocity: 100.00 cm/s MV A velocity: 35.40 cm/s   SHUNTS MV E/A ratio:  2.82         Systemic VTI:  0.07 m                             Systemic Diam: 2.40 cm Vernell Leep MD Electronically signed by Vernell Leep MD Signature Date/Time: 09/26/2020/11:52:37 AM    Final Loreli Dollar)     Assessment/Plan: 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  I recommended bilateral craniotomy for evacuation of the lesions.  I had a long talk with the brother and the sister yesterday when they were in the room.  I went over the imaging with them in detail.  They agree with the surgery.  The patient is now agreeing with the surgery.  States he is ready to move forward and "get these out of me."  Have an operating room ready for this evening.  They understand the risks of surgery include but are not limited to bleeding, infection, stroke, numbness, weakness, visual changes, paralysis, speech, need for further surgery, reaccumulation, lack of relief of symptoms, worsening symptoms, and anesthesia risk including DVT pneumonia MI and death.  They agree to proceed.  Continue A. fib care per cardiology recommendations  N.p.o.  Check PT/INR  Estimated body mass index is 27.06 kg/m as calculated from the following:   Height as of 02/03/18: 5\' 11"  (1.803 m).   Weight as of 02/03/18: 88 kg.    LOS: 2 days    GIANLUCCA SZYMBORSKI 09/27/2020, 9:55 AM    '

## 2020-09-27 NOTE — Anesthesia Preprocedure Evaluation (Addendum)
Anesthesia Evaluation  Patient identified by MRN, date of birth, ID band Patient awake    Reviewed: Allergy & Precautions, NPO status , Patient's Chart, lab work & pertinent test results  History of Anesthesia Complications Negative for: history of anesthetic complications  Airway Mallampati: II  TM Distance: >3 FB Neck ROM: Full    Dental  (+) Poor Dentition, Missing,    Pulmonary Current Smoker and Patient abstained from smoking.,    Pulmonary exam normal        Cardiovascular hypertension, Pt. on medications Normal cardiovascular exam  TTE 09/26/20: severe global hypokinesis, LVEF 03-83%, RV systolic function low normal,mild LAE/RAE, mild MR, likely bicuspid aortic valve (valve area by VTI 0.92 cm, Vmax 2.08 m/s)   Neuro/Psych Bilateral subdural hematomas negative psych ROS   GI/Hepatic negative GI ROS, Neg liver ROS,   Endo/Other  negative endocrine ROS  Renal/GU negative Renal ROS  negative genitourinary   Musculoskeletal negative musculoskeletal ROS (+)   Abdominal   Peds  Hematology negative hematology ROS (+)   Anesthesia Other Findings Day of surgery medications reviewed with patient.  Reproductive/Obstetrics negative OB ROS                            Anesthesia Physical Anesthesia Plan  ASA: IV  Anesthesia Plan: General   Post-op Pain Management:    Induction: Intravenous  PONV Risk Score and Plan: Treatment may vary due to age or medical condition, Ondansetron and Dexamethasone  Airway Management Planned: Oral ETT  Additional Equipment: Arterial line  Intra-op Plan:   Post-operative Plan: Possible Post-op intubation/ventilation  Informed Consent: I have reviewed the patients History and Physical, chart, labs and discussed the procedure including the risks, benefits and alternatives for the proposed anesthesia with the patient or authorized representative who has  indicated his/her understanding and acceptance.     Dental advisory given  Plan Discussed with: CRNA  Anesthesia Plan Comments:        Anesthesia Quick Evaluation
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
## 2020-09-27 NOTE — Progress Notes (Signed)
Afib w/RVR., Recommend amiodarone drip to control heart rate perioperatively.   Nigel Mormon, MD Pager: [phone] Office: [phone]

## 2020-09-27 NOTE — Anesthesia Procedure Notes (Signed)
Arterial Line Insertion Start/End3/10/2020 4:50 PM, 09/27/2020 4:55 PM Performed by: Clearnce Sorrel, CRNA  Patient location: Pre-op. Preanesthetic checklist: patient identified, IV checked, site marked, risks and benefits discussed, surgical consent, monitors and equipment checked, pre-op evaluation, timeout performed and anesthesia consent Lidocaine 1% used for infiltration Left, radial was placed Catheter size: 20 Fr Hand hygiene performed  and maximum sterile barriers used   Attempts: 1 Procedure performed without using ultrasound guided technique. Following insertion, dressing applied. Post procedure assessment: normal and unchanged

## 2020-09-28 ENCOUNTER — Inpatient Hospital Stay (HOSPITAL_COMMUNITY): Payer: Medicare HMO

## 2020-09-28 DIAGNOSIS — I4891 Unspecified atrial fibrillation: Secondary | ICD-10-CM

## 2020-09-28 LAB — BASIC METABOLIC PANEL
Anion gap: 11 (ref 5–15)
BUN: 23 mg/dL (ref 8–23)
CO2: 19 mmol/L — ABNORMAL LOW (ref 22–32)
Calcium: 8.9 mg/dL (ref 8.9–10.3)
Chloride: 107 mmol/L (ref 98–111)
Creatinine, Ser: 0.98 mg/dL (ref 0.61–1.24)
GFR, Estimated: >60 mL/min (ref >60)
Glucose, Bld: 159 mg/dL — ABNORMAL HIGH (ref 70–99)
Potassium: 4.2 mmol/L (ref 3.5–5.1)
Sodium: 137 mmol/L (ref 135–145)

## 2020-09-28 LAB — CBC
HCT: 45.2 % (ref 39.0–52.0)
Hemoglobin: 14.8 g/dL (ref 13.0–17.0)
MCH: 33.1 pg (ref 26.0–34.0)
MCHC: 32.7 g/dL (ref 30.0–36.0)
MCV: 101.1 fL — ABNORMAL HIGH (ref 80.0–100.0)
Platelets: 158 10*3/uL (ref 150–400)
RBC: 4.47 MIL/uL (ref 4.22–5.81)
RDW: 13.5 % (ref 11.5–15.5)
WBC: 10.8 10*3/uL — ABNORMAL HIGH (ref 4.0–10.5)
nRBC: 0 % (ref 0.0–0.2)

## 2020-09-28 MED ORDER — DILTIAZEM HCL ER COATED BEADS 120 MG PO CP24
120.0000 mg | ORAL_CAPSULE | Freq: Every evening | ORAL | Status: DC
Start: 2020-09-28 — End: 2020-09-29
  Administered 2020-09-28: 120 mg via ORAL
  Filled 2020-09-28 (×2): qty 1

## 2020-09-28 MED ORDER — AMIODARONE HCL 200 MG PO TABS
200.0000 mg | ORAL_TABLET | Freq: Two times a day (BID) | ORAL | Status: DC
  Administered 2020-09-28 – 2020-10-03 (×11): 200 mg via ORAL
  Filled 2020-09-28 (×11): qty 1

## 2020-09-28 MED ORDER — ORAL CARE MOUTH RINSE
15.0000 mL | Freq: Two times a day (BID) | OROMUCOSAL | Status: DC
  Administered 2020-09-28 – 2020-10-02 (×8): 15 mL via OROMUCOSAL

## 2020-09-28 NOTE — Progress Notes (Signed)
PT Cancellation Note  Patient Details Name: Mike Thomas MRN: [id number] DOB: 12-15-1950   Cancelled Treatment:    Reason Eval/Treat Not Completed: Active bedrest order. Pt with active bedrest orders s/p bil craniotomy 3/4. Discussed with RN to secure chat PT if bedrest orders lifted later this date. Will follow-up as able.  [name], PT, DPT Acute Rehabilitation Services  Pager: [phone] Office: [place] 09/28/2020, 9:17 AM

## 2020-09-28 NOTE — Progress Notes (Signed)
Postop day 1.  Patient status post bilateral craniotomies for subdurals.  Overall doing well.  He is awake and alert.  Reports moderate headache.  Denies numbness paresthesias or weakness.  No seizures.  He is afebrile.  His vital signs are stable.  Motor examination 5/5 bilaterally without drift.  Subdural drains slowing down.  Follow-up head CT scan demonstrates evidence of significant bilateral pneumocephalus consistent with over drainage.  Drain height has been readjusted.  Continue ICU observation and plan for follow-up head CT scan tomorrow to reassess pneumocephalus.

## 2020-09-28 NOTE — Progress Notes (Signed)
PT Cancellation Note  Patient Details Name: Mike Thomas MRN: [id number] DOB: 1951/01/27   Cancelled Treatment:    Reason Eval/Treat Not Completed: Active bedrest order. Pt still on bedrest, will follow-up another day.  [name], PT, DPT Acute Rehabilitation Services  Pager: [phone] Office: [place] 09/28/2020, 4:24 PM

## 2020-09-28 NOTE — Progress Notes (Addendum)
Subjective:  Patient underwent craniotomy for evacuation of bilateral subdural hematomas yesterday.  Patient denies chest pain, shortness of breath. He is without specific complaints today. He complains of headache. He is still on bed rest per neurosurgery.  Patient appears less confused compared to yesterday.   Objective:  Vital Signs in the last 24 hours: Temp:  [97.8 F (36.6 C)-98.6 F (37 C)] 98.6 F (37 C) (03/05 0400) Pulse Rate:  [41-124] 56 (03/05 0800) Resp:  [0-29] 0 (03/05 0800) BP: (102-134)/(82-108) 117/82 (03/05 0800) SpO2:  [88 %-100 %] 100 % (03/05 0800) Arterial Line BP: (91-140)/(77-97) 137/81 (03/05 0800)  Intake/Output from previous day: 03/04 0701 - 03/05 0700 In: 2913.9 [I.V.:2213.8; IV Piggyback:700] Out: 1098 [Urine:750; Drains:248; Blood:100]  Physical Exam Vitals and nursing note reviewed.  Constitutional:      General: He is not in acute distress.    Appearance: He is well-developed.  HENT:     Head: Normocephalic and atraumatic.  Eyes:     Conjunctiva/sclera: Conjunctivae normal.     Pupils: Pupils are equal, round, and reactive to light.  Neck:     Vascular: No JVD.  Cardiovascular:     Rate and Rhythm: Tachycardia present. Rhythm irregular.     Pulses: Normal pulses and intact distal pulses.     Heart sounds: No murmur heard.   Pulmonary:     Effort: Pulmonary effort is normal.     Breath sounds: Normal breath sounds. No wheezing or rales.  Abdominal:     General: Bowel sounds are normal.     Palpations: Abdomen is soft.     Tenderness: There is no rebound.  Musculoskeletal:        General: No tenderness. Normal range of motion.     Right lower leg: No edema.     Left lower leg: No edema.  Lymphadenopathy:     Cervical: No cervical adenopathy.  Skin:    General: Skin is warm and dry.  Neurological:     Mental Status: He is alert and oriented to person, place, and time.     Cranial Nerves: No cranial nerve deficit.     Comments:  Oriented, but confused     CMP Latest Ref Rng & Units 09/28/2020 09/27/2020  Glucose 70 - 99 mg/dL 159(H) 110(H)  BUN 8 - 23 mg/dL 23 22  Creatinine 0.61 - 1.24 mg/dL 0.98 1.05  Sodium 135 - 145 mmol/L 137 141  Potassium 3.5 - 5.1 mmol/L 4.2 3.5  Chloride 98 - 111 mmol/L 107 106  CO2 22 - 32 mmol/L 19(L) 25  Calcium 8.9 - 10.3 mg/dL 8.9 9.2   CBC Latest Ref Rng & Units 09/28/2020 09/27/2020  WBC 4.0 - 10.5 K/uL 10.8(H) 7.5  Hemoglobin 13.0 - 17.0 g/dL 14.8 15.7  Hematocrit 39.0 - 52.0 % 45.2 47.7  Platelets 150 - 400 K/uL 158 183   Lipid Panel  No results found for: CHOL, TRIG, HDL, CHOLHDL, VLDL, LDLCALC, LDLDIRECT HEMOGLOBIN A1C No results found for: HGBA1C, MPG TSH Recent Labs    09/25/20 1833  TSH 1.660   CARDIAC STUDIES:  Telemetry: Atrial fibrillation at a rate of approximately 100--120 bpm  Echocardiogram 09/26/2020: 1. Mildly dilated left ventricule with normal wall thickness. Severe  global hypokinesis, LVEF 25 to 30%. No LV thrombus seen. Left ventricular diastolic  parameters are indeterminate due to atrial fibrillation.  2. Right ventricular systolic function is low normal. The right  ventricular size is normal.  3. Left atrial size was mildly  dilated.  4. Right atrial size was mildly dilated.  5. The mitral valve is grossly normal. Mild mitral valve regurgitation.  6. Likely bicuspid aortic valve with severe calcification with low flow low gradient aortic stenosis.Aortic valve area, by VTI measures 0.92 cm. Aortic valve Vmax  measures 2.08 m/s.   EKG 09/25/2020: A. fib with RVR Possible old anterior infarct, poor R wave progression Long QT interval  Chest x-ray 09/24/2020 (performed at [facility]): No acute intrathoracic process  Reviewed [facility] labs 09/24/2020: Glucose 110, BUN/Cr 15/0.93. EGFR 84. Na/K 140/3.5. Rest of the CMP normal H/H 15/46. MCV 98. Platelets 197 HbA1C 6.1% proBNP 7124 (0-125) TSH N/A  09/13/2019: Chol 209, TG 128,  HDL 65, LDL 118   Assessment & Recommendations:  70 y.o. Caucasian male  with hypertension, hyperlipidemia, admitted with bilateral subdural hematomas, found to have A. fib, HFrEF, low-flow low gradient aortic stenosis  A. Fib: Chronicity unclear. He remains slightly tachycardic, with episodes of heart rate as high as 124 bpm. Continue metoprolol succinate 50 mg daily add diltiazem CD 120 mg daily in the evenings for improved rate control. Transition from IV amiodarone to amiodarone 200 mg p.o. twice daily. Chads vas score of at least 2, annual stroke risk at least 2%, however in view of subdural hematoma patient is not currently candidate for anticoagulation. Initiate anticoagulation when appropriate per neurosurgery.  HFrEF, low-flow low gradient aortic stenosis: Differential remains wide including ischemic or nonischemic cardiomyopathy, as well as valvular cardiomyopathy. Patient will need further ischemic evaluation likely on an outpatient basis given his subdural hematoma. Continue metoprolol.  Patient was seen in collaboration with Dr. Einar Gip. He also reviewed patient's chart and examined the patient. Dr. Einar Gip is in agreement of the plan.    Alethia Berthold, PA-C 09/28/2020, 11:21 AM Office: [phone]

## 2020-09-28 NOTE — Progress Notes (Signed)
eLink Physician-Brief Progress Note Patient Name: Mike Thomas DOB: December 25, 1950 MRN: [id number]   Date of Service  09/28/2020  HPI/Events of Note  Patient is confused and attempting to get out of bed, creating a fall risk, he is s/p evacuation of bilateral sub-dural hematomas.  eICU Interventions  Posey restraints ordered for patient's safety.        [name] 09/28/2020, 12:34 AM

## 2020-09-28 NOTE — Progress Notes (Signed)
Pt R subdural drain put out 120mL of dark sanguineous drainage over about 85m following bathing, patient also complaining of headache. Neuro exam unchanged, NSG notified of drain output and pain PRNs given. Drain raised moderately but still below sx site. Will continue to monitor.  Candy Sledge, RN

## 2020-09-28 NOTE — Progress Notes (Signed)
Per NSG they will re-evaluate patient's readiness to restart his anticoagulation on Monday, on-call Cardiology notified.  Candy Sledge, RN

## 2020-09-29 ENCOUNTER — Inpatient Hospital Stay (HOSPITAL_COMMUNITY): Payer: Medicare HMO

## 2020-09-29 LAB — POCT I-STAT 7, (LYTES, BLD GAS, ICA,H+H)
Acid-base deficit: 2 mmol/L (ref 0.0–2.0)
Bicarbonate: 24.3 mmol/L (ref 20.0–28.0)
Calcium, Ion: 1.18 mmol/L (ref 1.15–1.40)
HCT: 43 % (ref 39.0–52.0)
Hemoglobin: 14.6 g/dL (ref 13.0–17.0)
O2 Saturation: 100 %
Patient temperature: 35.3
Potassium: 3.9 mmol/L (ref 3.5–5.1)
Sodium: 141 mmol/L (ref 135–145)
TCO2: 26 mmol/L (ref 22–32)
pCO2 arterial: 43.2 mmHg (ref 32.0–48.0)
pH, Arterial: 7.351 (ref 7.350–7.450)
pO2, Arterial: 300 mmHg — ABNORMAL HIGH (ref 83.0–108.0)

## 2020-09-29 MED ORDER — DIGOXIN 0.25 MG/ML IJ SOLN
0.2500 mg | INTRAMUSCULAR | Status: AC
  Administered 2020-09-29 – 2020-09-30 (×4): 0.25 mg via INTRAVENOUS
  Filled 2020-09-29 (×4): qty 2

## 2020-09-29 MED ORDER — DIGOXIN 125 MCG PO TABS
0.1250 mg | ORAL_TABLET | Freq: Every day | ORAL | Status: DC
  Administered 2020-09-30 – 2020-10-03 (×4): 0.125 mg via ORAL
  Filled 2020-09-29 (×4): qty 1

## 2020-09-29 MED ORDER — METOPROLOL SUCCINATE ER 25 MG PO TB24
25.0000 mg | ORAL_TABLET | Freq: Every evening | ORAL | Status: DC
  Administered 2020-09-30 – 2020-10-02 (×3): 25 mg via ORAL
  Filled 2020-09-29 (×3): qty 1

## 2020-09-29 MED ORDER — LEVETIRACETAM 500 MG PO TABS
500.0000 mg | ORAL_TABLET | Freq: Two times a day (BID) | ORAL | Status: DC
  Administered 2020-09-29 – 2020-10-03 (×8): 500 mg via ORAL
  Filled 2020-09-29 (×8): qty 1

## 2020-09-29 MED ORDER — PANTOPRAZOLE SODIUM 40 MG PO TBEC
40.0000 mg | DELAYED_RELEASE_TABLET | Freq: Every day | ORAL | Status: DC
  Administered 2020-09-29 – 2020-10-02 (×4): 40 mg via ORAL
  Filled 2020-09-29 (×4): qty 1

## 2020-09-29 NOTE — Progress Notes (Signed)
Patients significant other Mike Thomas called regarding a letter for the patient to issue to the court. Mike Thomas stated his sister would pick up.

## 2020-09-29 NOTE — Progress Notes (Signed)
No new issues or problems overnight.  Patient complains of moderate headache.  He is awake and alert.  His speech is fluent.  He follows commands bilaterally.  Strength appears symmetric.  Unfortunately the patient's drain height was once again dropped down to the level of the floor and I believe he suffered some over drainage overnight.  Follow-up head CT scan this morning demonstrates continued bifrontal pneumocephalus although this is may be somewhat improved from yesterday.  I have raised the drain height.  I am uncomfortable removing the drains at present as he still has a significant amount of bloody output.  Plan to recheck CT scan tomorrow and defer decision with regard to drain discontinuation to Dr. Ronnald Ramp.

## 2020-09-29 NOTE — Progress Notes (Signed)
Bloody drainage noted around the site of the left SD drain. Jinny Blossom was notified. Site reinforced, will continue to monitor.

## 2020-09-29 NOTE — Progress Notes (Addendum)
Subjective:  Patient underwent craniotomy for evacuation of bilateral subdural hematomas 09/27/2020. Patient denies chest pain, shortness of breath. He is without specific complaints today.  He is awake and alert, no specific complaints, wants to go home.  Nursing has noticed low blood pressure and concerned about physical therapy and orthostatic changes.  Objective:  Vital Signs in the last 24 hours: Temp:  [97.8 F (36.6 C)-98.3 F (36.8 C)] 97.9 F (36.6 C) (03/06 1200) Pulse Rate:  [40-125] 90 (03/06 1300) Resp:  [12-20] 18 (03/06 1300) BP: (91-122)/(75-97) 91/76 (03/06 1300) SpO2:  [92 %-100 %] 100 % (03/06 1300) Arterial Line BP: (86-138)/(73-109) 113/94 (03/06 1300)  Intake/Output from previous day: 03/05 0701 - 03/06 0700 In: 1855 [I.V.:1605; IV Piggyback:250] Out: 1728 [CXKGY:1856; Drains:253]  Physical Exam Vitals and nursing note reviewed.  Constitutional:      General: He is not in acute distress.    Appearance: He is well-developed.  HENT:     Head: Normocephalic.     Comments: Craniotomy drain present bilateral Eyes:     Conjunctiva/sclera: Conjunctivae normal.  Neck:     Vascular: No JVD.  Cardiovascular:     Rate and Rhythm: Tachycardia present. Rhythm irregular.     Pulses: Normal pulses and intact distal pulses.     Heart sounds: Heart sounds are distant. No murmur heard.   Pulmonary:     Effort: Pulmonary effort is normal.     Breath sounds: Normal breath sounds. No wheezing or rales.  Abdominal:     General: Bowel sounds are normal.     Palpations: Abdomen is soft.     Tenderness: There is no rebound.  Musculoskeletal:        General: No tenderness. Normal range of motion.     Right lower leg: No edema.     Left lower leg: No edema.  Lymphadenopathy:     Cervical: No cervical adenopathy.  Skin:    General: Skin is warm and dry.  Neurological:     General: No focal deficit present.     Mental Status: He is alert and oriented to person, place,  and time.     Comments: Oriented, but confused     CMP Latest Ref Rng & Units 09/28/2020 09/27/2020 09/27/2020  Glucose 70 - 99 mg/dL 159(H) - 110(H)  BUN 8 - 23 mg/dL 23 - 22  Creatinine 0.61 - 1.24 mg/dL 0.98 - 1.05  Sodium 135 - 145 mmol/L 137 141 141  Potassium 3.5 - 5.1 mmol/L 4.2 3.9 3.5  Chloride 98 - 111 mmol/L 107 - 106  CO2 22 - 32 mmol/L 19(L) - 25  Calcium 8.9 - 10.3 mg/dL 8.9 - 9.2   CBC Latest Ref Rng & Units 09/28/2020 09/27/2020 09/27/2020  WBC 4.0 - 10.5 K/uL 10.8(H) - 7.5  Hemoglobin 13.0 - 17.0 g/dL 14.8 14.6 15.7  Hematocrit 39.0 - 52.0 % 45.2 43.0 47.7  Platelets 150 - 400 K/uL 158 - 183   Lipid Panel  No results found for: CHOL, TRIG, HDL, CHOLHDL, VLDL, LDLCALC, LDLDIRECT HEMOGLOBIN A1C No results found for: HGBA1C, MPG TSH Recent Labs    09/25/20 1833  TSH 1.660    CT scan of the head 09/28/2020: 1. Bilateral subdural hematoma evacuation with prominent degree of pneumocephalus causing marks effect on the frontal lobes, please correlate for clinical signs of tension. 2. Kinking of the right subdural catheter with tip invested in right frontal sulci  CARDIAC STUDIES:  Telemetry: Atrial fibrillation at a rate of  approximately 100--120 bpm  Echocardiogram 09/26/2020: 1. Mildly dilated left ventricule with normal wall thickness. Severe  global hypokinesis, LVEF 25 to 30%. No LV thrombus seen. Left ventricular diastolic  parameters are indeterminate due to atrial fibrillation.  2. Right ventricular systolic function is low normal. The right  ventricular size is normal.  3. Left atrial size was mildly dilated.  4. Right atrial size was mildly dilated.  5. The mitral valve is grossly normal. Mild mitral valve regurgitation.  6. Likely bicuspid aortic valve with severe calcification with low flow low gradient aortic stenosis.Aortic valve area, by VTI measures 0.92 cm. Aortic valve Vmax  measures 2.08 m/s.   EKG 09/25/2020: A. fib with RVR Possible old  anterior infarct, poor R wave progression Long QT interval  Chest x-ray 09/24/2020 (performed at [facility]): No acute intrathoracic process  Reviewed [facility] labs 09/24/2020: Glucose 110, BUN/Cr 15/0.93. EGFR 84. Na/K 140/3.5. Rest of the CMP normal H/H 15/46. MCV 98. Platelets 197 HbA1C 6.1% proBNP 7124 (0-125) TSH N/A  09/13/2019: Chol 209, TG 128, HDL 65, LDL 118   Scheduled Meds: . amiodarone  200 mg Oral BID  . atorvastatin  20 mg Oral Daily  . Chlorhexidine Gluconate Cloth  6 each Topical Daily  . dexamethasone (DECADRON) injection  4 mg Intravenous Q6H   Followed by  . [START ON 09/30/2020] dexamethasone (DECADRON) injection  4 mg Intravenous Q8H  . diltiazem  120 mg Oral QPM  . mouth rinse  15 mL Mouth Rinse BID  . metoprolol succinate  50 mg Oral Daily  . pantoprazole (PROTONIX) IV  40 mg Intravenous QHS  . senna  1 tablet Oral BID  . tamsulosin  0.4 mg Oral Daily   Continuous Infusions: . sodium chloride 10 mL/hr at 09/29/20 0800  . 0.9 % NaCl with KCl 20 mEq / L 75 mL/hr at 09/29/20 0800  . levETIRAcetam 500 mg (09/29/20 0926)   PRN Meds:.acetaminophen **OR** acetaminophen, HYDROcodone-acetaminophen, labetalol, morphine injection, ondansetron **OR** ondansetron (ZOFRAN) IV, promethazine  Assessment & Recommendations:  Mike Thomas is a 70 y.o. Caucasian male patient with hypertension, hyperlipidemia, admitted with bilateral subdural hematomas, found to have A. fib, HFrEF, low-flow low gradient aortic stenosis  Persistent A. Fib: CHA2DS2-VASc Score is 3.  Yearly risk of stroke: 3.2% (A, HTN, CHF).  Score of 1=0.6; 2=2.2; 3=3.2; 4=4.8; 5=7.2; 6=9.8; 7=>9.8) -(CHF; HTN; vasc disease DM,  Male = 1; Age <65 =0; 65-74 = 1,  >75 =2; stroke/embolism= 2).     HFrEF, low-flow low gradient aortic stenosis:   Hypertension, primary, presently hypotensive since surgery.   Recommendation: I will reduce the dose of metoprolol succinate to 25 mg, add  digoxin for rate control, continue amiodarone p.o.  We will also change metoprolol succinate to be taken in the evening to avoid daytime hypotension so patient can do physical therapy.  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  I have also discontinued diltiazem in view of low LVEF.  We will continue to follow.  Discussed with the nursing staff.  With regard to anticoagulation, there is no urgency in starting anticoagulation, overall cardioembolic risk is low.  Do not suspect acute decompensated heart failure, suspect his LVEF is probably normalizing or normal.   Adrian Prows, PA-C 09/29/2020, 1:57 PM Office: [phone]

## 2020-09-30 ENCOUNTER — Inpatient Hospital Stay (HOSPITAL_COMMUNITY): Payer: Medicare HMO

## 2020-09-30 DIAGNOSIS — I5020 Unspecified systolic (congestive) heart failure: Secondary | ICD-10-CM

## 2020-09-30 MED FILL — Thrombin (Recombinant) For Soln 5000 Unit: CUTANEOUS | Qty: 5000 | Status: AC

## 2020-09-30 NOTE — Progress Notes (Signed)
Subjective: Patient reports no headache  Objective: Vital signs in last 24 hours: Temp:  [97.9 F (36.6 C)-98.2 F (36.8 C)] 98.2 F (36.8 C) (03/06 1600) Pulse Rate:  [34-148] 53 (03/07 0600) Resp:  [8-27] 8 (03/07 0600) BP: (79-136)/(63-96) 107/75 (03/07 0600) SpO2:  [92 %-100 %] 97 % (03/07 0600) Arterial Line BP: (78-120)/(69-109) 111/77 (03/07 0600)  Intake/Output from previous day: 03/06 0701 - 03/07 0700 In: 1884.2 [I.V.:1784.2; IV Piggyback:100] Out: 6237 [Urine:1550; Drains:58] Intake/Output this shift: No intake/output data recorded.  Neurologic: Grossly normal, awake alert conversant, knew my name, eating well  Lab Results: Lab Results  Component Value Date   WBC 10.8 (H) 09/28/2020   HGB 14.8 09/28/2020   HCT 45.2 09/28/2020   MCV 101.1 (H) 09/28/2020   PLT 158 09/28/2020   Lab Results  Component Value Date   INR 1.1 09/27/2020   BMET Lab Results  Component Value Date   NA 137 09/28/2020   K 4.2 09/28/2020   CL 107 09/28/2020   CO2 19 (L) 09/28/2020   GLUCOSE 159 (H) 09/28/2020   BUN 23 09/28/2020   CREATININE 0.98 09/28/2020   CALCIUM 8.9 09/28/2020    Studies/Results: CT HEAD WO CONTRAST  Result Date: 09/30/2020 CLINICAL DATA:  Follow-up examination for subdural hematoma. EXAM: CT HEAD WITHOUT CONTRAST TECHNIQUE: Contiguous axial images were obtained from the base of the skull through the vertex without intravenous contrast. COMPARISON:  Prior CT from 09/29/2020. FINDINGS: Brain: Postoperative changes from prior bilateral subdural evacuation again seen. Subdural drains remain in place, stable in position, with the right drain partially kinked and invested at the posterior right frontal region. Continued slight interval decrease in pneumocephalus as compared to previous exam. Persistent peaking of the frontal lobes bilaterally. Otherwise, the residual bilateral subdural collections are not significantly changed in size from previous. Degree of residual  high density blood products within the collections is not significantly changed, with no evidence for interval bleeding. Gel-Foam material noted subjacent to the craniotomy bone flaps. Remainder of the brain is stable in appearance with no new infarct or hemorrhage. No hydrocephalus or significant midline shift. Basilar cisterns remain patent. No herniation. Vascular: No hyperdense vessel. Calcified atherosclerosis present at skull base. Skull: Post craniotomy changes without adverse features. Sinuses/Orbits: Globes and orbital soft tissues within normal limits. Mild mucosal thickening within the ethmoidal air cells and maxillary sinuses. Mastoid air cells remain clear. Other: None. IMPRESSION: 1. Continued slight interval decrease in pneumocephalus as compared to previous exam. Otherwise, the residual bilateral subdural collections are not significantly changed in size from, with no evidence for interval bleeding. Subdural drains remain in place and are in stable position. 2. No other new acute intracranial abnormality. Electronically Signed   By: Jeannine Boga M.D.   On: 09/30/2020 02:20   CT HEAD WO CONTRAST  Result Date: 09/29/2020 CLINICAL DATA:  Follow-up examination for subdural hemorrhage. EXAM: CT HEAD WITHOUT CONTRAST TECHNIQUE: Contiguous axial images were obtained from the base of the skull through the vertex without intravenous contrast. COMPARISON:  Prior CT from 09/28/2020 FINDINGS: Brain: Postoperative changes from bilateral subdural evacuation again seen. Subdural drains remain in place. The left drain courses posteriorly and terminates over the left occipital convexity. Kinking of the right drain with the tip impregnated than the posterior right frontal region again seen, stable. There has been slight interval decrease in pneumocephalus from previous exam with associated slight interval decrease in size of bilateral subdural collections, now measuring up to approximately 2.4 cm on the  right and 2.1 cm on the left. Persistent peaking of the frontal lobes. Degree of residual high density blood products is relatively similar. Gel-Foam material again noted subjacent to the craniotomy bone flaps. Remainder the brain is stable without new infarct, hemorrhage, hydrocephalus, or midline shift. Vascular: No hyperdense vessel. Calcified atherosclerosis at the skull base. Skull: Post craniotomy changes without complication. Sinuses/Orbits: Globes and orbital soft tissues demonstrate no acute finding. Scattered mucosal thickening noted within the ethmoidal air cells and maxillary sinuses. Mastoid air cells remain clear. Other: None. IMPRESSION: 1. Slight interval decrease in size of bilateral subdural collections, largely due to slightly decreased pneumocephalus as compared to previous, although a fairly sizable amount still remains. Bilateral subdural drains remain in place with the right drain kinked and invested in the posterior right frontal region, stable. 2. No other new acute intracranial abnormality. Electronically Signed   By: Jeannine Boga M.D.   On: 09/29/2020 02:42    Assessment/Plan: Drains removed, will remove aline and mobilize, PT/OT, afib per cards  Estimated body mass index is 27.06 kg/m as calculated from the following:   Height as of 02/03/18: 5\' 11"  (1.803 m).   Weight as of 02/03/18: 88 kg.    LOS: 5 days    FARUQ ROSENBERGER 09/30/2020, 7:43 AM
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
## 2020-09-30 NOTE — Progress Notes (Signed)
Inpatient Rehab Admissions Coordinator Note:   Per therapy recommendations, pt was screened for CIR candidacy by Veneta Sliter, MS CCC-SLP. At this time, Pt. Appears to have functional decline and is a potential  candidate for CIR. Will place order for rehab consult per protocol.  Please contact me with questions.   Newton Frutiger, MS, CCC-SLP Rehab Admissions Coordinator  [phone] (celll) [phone] (office)  

## 2020-09-30 NOTE — Progress Notes (Cosign Needed Addendum)
Subjective:  Patient underwent craniotomy for evacuation of bilateral subdural hematomas 09/27/2020. He continues to complain of bilateral headache.  Patient is awake and more alert than previous visits, resting comfortably. Patient denies chest pain, dyspnea.  He is without specific complaints at this time. He has not yet ambulated within his room. According to patient's nursing staff, plan is to start physical therapy today if patient is able to tolerate.  Objective:  Vital Signs in the last 24 hours: Temp:  [97.9 F (36.6 C)-98.3 F (36.8 C)] 98.3 F (36.8 C) (03/07 0800) Pulse Rate:  [34-148] 77 (03/07 1000) Resp:  [8-27] 16 (03/07 1100) BP: (79-136)/(63-96) 124/78 (03/07 1100) SpO2:  [92 %-100 %] 99 % (03/07 1100) Arterial Line BP: (78-120)/(69-94) 111/77 (03/07 0600)  Intake/Output from previous day: 03/06 0701 - 03/07 0700 In: 1884.2 [I.V.:1784.2; IV Piggyback:100] Out: 1610 [Urine:1550; Drains:58]  Physical Exam Vitals and nursing note reviewed.  Constitutional:      General: He is not in acute distress.    Appearance: He is well-developed.  HENT:     Head: Normocephalic.     Comments: Craniotomy drains bilaterally have been removed Eyes:     Conjunctiva/sclera: Conjunctivae normal.  Neck:     Vascular: No JVD.  Cardiovascular:     Rate and Rhythm: Normal rate. Rhythm irregular.     Pulses: Normal pulses and intact distal pulses.     Heart sounds: Heart sounds are distant. No murmur heard.   Pulmonary:     Effort: Pulmonary effort is normal.     Breath sounds: Normal breath sounds. No wheezing or rales.  Abdominal:     General: Bowel sounds are normal.     Palpations: Abdomen is soft.     Tenderness: There is no rebound.  Musculoskeletal:        General: No tenderness. Normal range of motion.     Right lower leg: No edema.     Left lower leg: No edema.  Lymphadenopathy:     Cervical: No cervical adenopathy.  Skin:    General: Skin is warm and dry.   Neurological:     General: No focal deficit present.     Mental Status: He is alert and oriented to person, place, and time.     Comments: Oriented, confusion has improved.    CMP Latest Ref Rng & Units 09/28/2020 09/27/2020 09/27/2020  Glucose 70 - 99 mg/dL 159(H) - 110(H)  BUN 8 - 23 mg/dL 23 - 22  Creatinine 0.61 - 1.24 mg/dL 0.98 - 1.05  Sodium 135 - 145 mmol/L 137 141 141  Potassium 3.5 - 5.1 mmol/L 4.2 3.9 3.5  Chloride 98 - 111 mmol/L 107 - 106  CO2 22 - 32 mmol/L 19(L) - 25  Calcium 8.9 - 10.3 mg/dL 8.9 - 9.2   CBC Latest Ref Rng & Units 09/28/2020 09/27/2020 09/27/2020  WBC 4.0 - 10.5 K/uL 10.8(H) - 7.5  Hemoglobin 13.0 - 17.0 g/dL 14.8 14.6 15.7  Hematocrit 39.0 - 52.0 % 45.2 43.0 47.7  Platelets 150 - 400 K/uL 158 - 183   Lipid Panel  No results found for: CHOL, TRIG, HDL, CHOLHDL, VLDL, LDLCALC, LDLDIRECT HEMOGLOBIN A1C No results found for: HGBA1C, MPG TSH Recent Labs    09/25/20 1833  TSH 1.660    CT scan of the head 09/28/2020: 1. Bilateral subdural hematoma evacuation with prominent degree of pneumocephalus causing marks effect on the frontal lobes, please correlate for clinical signs of tension. 2. Kinking of the right  subdural catheter with tip invested in right frontal sulci  CARDIAC STUDIES:  Telemetry: Atrial fibrillation at a rate of approximately 75-85 bpm   Echocardiogram 09/26/2020: 1. Mildly dilated left ventricule with normal wall thickness. Severe  global hypokinesis, LVEF 25 to 30%. No LV thrombus seen. Left ventricular diastolic  parameters are indeterminate due to atrial fibrillation.  2. Right ventricular systolic function is low normal. The right  ventricular size is normal.  3. Left atrial size was mildly dilated.  4. Right atrial size was mildly dilated.  5. The mitral valve is grossly normal. Mild mitral valve regurgitation.  6. Likely bicuspid aortic valve with severe calcification with low flow low gradient aortic stenosis.Aortic  valve area, by VTI measures 0.92 cm. Aortic valve Vmax  measures 2.08 m/s.   EKG 09/25/2020: A. fib with RVR Possible old anterior infarct, poor R wave progression Long QT interval  Chest x-ray 09/24/2020 (performed at [facility]): No acute intrathoracic process  Reviewed [facility] labs 09/24/2020: Glucose 110, BUN/Cr 15/0.93. EGFR 84. Na/K 140/3.5. Rest of the CMP normal H/H 15/46. MCV 98. Platelets 197 HbA1C 6.1% proBNP 7124 (0-125) TSH N/A  09/13/2019: Chol 209, TG 128, HDL 65, LDL 118   Scheduled Meds: . amiodarone  200 mg Oral BID  . atorvastatin  20 mg Oral Daily  . Chlorhexidine Gluconate Cloth  6 each Topical Daily  . digoxin  0.125 mg Oral Daily  . levETIRAcetam  500 mg Oral BID  . mouth rinse  15 mL Mouth Rinse BID  . metoprolol succinate  25 mg Oral QPM  . pantoprazole  40 mg Oral QHS  . senna  1 tablet Oral BID  . tamsulosin  0.4 mg Oral Daily   Continuous Infusions: . sodium chloride Stopped (09/30/20 0726)  . 0.9 % NaCl with KCl 20 mEq / L 10 mL/hr at 09/30/20 1004   PRN Meds:.acetaminophen **OR** acetaminophen, HYDROcodone-acetaminophen, labetalol, morphine injection, ondansetron **OR** ondansetron (ZOFRAN) IV, promethazine  Assessment & Recommendations:  Mike Thomas is a 70 y.o. Caucasian male patient with hypertension, hyperlipidemia, admitted with bilateral subdural hematomas, found to have A. fib, HFrEF, low-flow low gradient aortic stenosis  Persistent A. Fib: CHA2DS2-VASc score is 3.  Yearly risk of stroke 3.2%.  Risks include age, hypertension, CHF.  HFrEF, low-flow low gradient aortic stenosis:   Hypertension, primary, hypotensive since surgery 09/27/20.   Recommendation:  Patient remains in persistent atrial fibrillation, he is well rate controlled.  Patient's blood pressure remains soft, however has improved since reducing metoprolol.  We will continue amiodarone 200 mg p.o. twice daily, digoxin 0.125 mg p.o. daily, and  metoprolol succinate 25 mg p.o. daily in the evening.  Patient will proceed with physical therapy. If patient remains stable on present medications and able to tolerate PT over the next 24 hours will consider increasing metoprolol succinate.   As echocardiogram is without significant wall motion abnormality and EKG normal with the exception of atrial fibrillation, suspect nonischemic cardiomyopathy in the setting of post bilateral subdural hematomas.  Anticipate LVEF will improve following acute event and with rate control of atrial fibrillation, will plan to repeat echocardiogram outpatient.  In regard to anticoagulation, patient's CHA2DS2-VASc score is 3 (>2) therefore would recommend anticoagulation when safe per neurosugery.  In view of recent subdural hematomas will defer determination of appropriate timing for initiation of anticoagulation to neurosurgery.    Alethia Berthold, PA-C 09/30/2020, 11:47 AM Office: [phone]

## 2020-10-01 ENCOUNTER — Inpatient Hospital Stay (HOSPITAL_COMMUNITY): Payer: Medicare HMO

## 2020-10-01 ENCOUNTER — Encounter (HOSPITAL_COMMUNITY): Payer: Self-pay | Admitting: Neurological Surgery

## 2020-10-01 ENCOUNTER — Other Ambulatory Visit: Payer: Self-pay | Admitting: Cardiology

## 2020-10-01 DIAGNOSIS — R609 Edema, unspecified: Secondary | ICD-10-CM

## 2020-10-01 DIAGNOSIS — Z9889 Other specified postprocedural states: Secondary | ICD-10-CM

## 2020-10-01 DIAGNOSIS — S065X9A Traumatic subdural hemorrhage with loss of consciousness of unspecified duration, initial encounter: Secondary | ICD-10-CM

## 2020-10-01 DIAGNOSIS — M7989 Other specified soft tissue disorders: Secondary | ICD-10-CM

## 2020-10-01 MED ORDER — AMIODARONE HCL 200 MG PO TABS: 200.0000 mg | ORAL_TABLET | Freq: Every day | ORAL | 1 refills | Status: DC
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
## 2020-10-01 NOTE — Progress Notes (Signed)
Subjective: Patient reports intermittent mild headache, no NTW, some toe tingling at times, some calf aching  Objective: Vital signs in last 24 hours: Temp:  [97.6 F (36.4 C)-99.1 F (37.3 C)] 99.1 F (37.3 C) (03/08 0400) Pulse Rate:  [31-98] 60 (03/08 0700) Resp:  [0-24] 24 (03/08 0700) BP: (102-141)/(67-129) 123/77 (03/08 0700) SpO2:  [93 %-100 %] 95 % (03/08 0700)  Intake/Output from previous day: 03/07 0701 - 03/08 0700 In: 852.5 [P.O.:720; I.V.:132.5] Out: 1932 [WUJWJ:1914; Drains:7] Intake/Output this shift: No intake/output data recorded.  Neurologic: Grossly normal, no homan's or cords palpable, incisions good  Lab Results: Lab Results  Component Value Date   WBC 10.8 (H) 09/28/2020   HGB 14.8 09/28/2020   HCT 45.2 09/28/2020   MCV 101.1 (H) 09/28/2020   PLT 158 09/28/2020   Lab Results  Component Value Date   INR 1.1 09/27/2020   BMET Lab Results  Component Value Date   NA 137 09/28/2020   K 4.2 09/28/2020   CL 107 09/28/2020   CO2 19 (L) 09/28/2020   GLUCOSE 159 (H) 09/28/2020   BUN 23 09/28/2020   CREATININE 0.98 09/28/2020   CALCIUM 8.9 09/28/2020    Studies/Results: CT HEAD WO CONTRAST  Result Date: 09/30/2020 CLINICAL DATA:  Follow-up examination for subdural hematoma. EXAM: CT HEAD WITHOUT CONTRAST TECHNIQUE: Contiguous axial images were obtained from the base of the skull through the vertex without intravenous contrast. COMPARISON:  Prior CT from 09/29/2020. FINDINGS: Brain: Postoperative changes from prior bilateral subdural evacuation again seen. Subdural drains remain in place, stable in position, with the right drain partially kinked and invested at the posterior right frontal region. Continued slight interval decrease in pneumocephalus as compared to previous exam. Persistent peaking of the frontal lobes bilaterally. Otherwise, the residual bilateral subdural collections are not significantly changed in size from previous. Degree of residual  high density blood products within the collections is not significantly changed, with no evidence for interval bleeding. Gel-Foam material noted subjacent to the craniotomy bone flaps. Remainder of the brain is stable in appearance with no new infarct or hemorrhage. No hydrocephalus or significant midline shift. Basilar cisterns remain patent. No herniation. Vascular: No hyperdense vessel. Calcified atherosclerosis present at skull base. Skull: Post craniotomy changes without adverse features. Sinuses/Orbits: Globes and orbital soft tissues within normal limits. Mild mucosal thickening within the ethmoidal air cells and maxillary sinuses. Mastoid air cells remain clear. Other: None. IMPRESSION: 1. Continued slight interval decrease in pneumocephalus as compared to previous exam. Otherwise, the residual bilateral subdural collections are not significantly changed in size from, with no evidence for interval bleeding. Subdural drains remain in place and are in stable position. 2. No other new acute intracranial abnormality. Electronically Signed   By: Jeannine Boga M.D.   On: 09/30/2020 02:20    Assessment/Plan: Doing well  CIR consult  DVT screening  Likely start SQ heparin tomorrow  To stepdown today  Estimated body mass index is 27.06 kg/m as calculated from the following:   Height as of 02/03/18: 5\' 11"  (1.803 m).   Weight as of 02/03/18: 88 kg.    LOS: 6 days    GREOGORY CORNETTE 10/01/2020, 8:03 AM

## 2020-10-01 NOTE — Plan of Care (Signed)

## 2020-10-01 NOTE — Progress Notes (Signed)
Lower extremity venous has been completed.   Preliminary results in CV Proc.   Mike Thomas 10/01/2020 9:20 AM
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
## 2020-10-01 NOTE — Progress Notes (Addendum)
Subjective:  Patient underwent craniotomy for evacuation of bilateral subdural hematomas 09/27/2020. Patient denies chest pain, shortness of breath. He is without specific complaints today.  He is presently tolerating all his medications well, plans are for him to go for rehab.  He has been able to ambulate to the bathroom without any problems.  Objective:  Vital Signs in the last 24 hours: Temp:  [97.4 F (36.3 C)-99.1 F (37.3 C)] 97.7 F (36.5 C) (03/08 1406) Pulse Rate:  [35-98] 88 (03/08 1406) Resp:  [0-24] 16 (03/08 1406) BP: (105-141)/(67-129) 113/84 (03/08 1406) SpO2:  [93 %-100 %] 100 % (03/08 1406) Weight:  [85.8 kg] 85.8 kg (03/08 1406)  Intake/Output from previous day: 03/07 0701 - 03/08 0700 In: 852.5 [P.O.:720; I.V.:132.5] Out: 1932 [LKGMW:1027; Drains:7]  Physical Exam Vitals and nursing note reviewed.  Constitutional:      General: He is not in acute distress.    Appearance: He is well-developed.  HENT:     Head: Normocephalic.     Comments: Bilateral temporal staples present. Eyes:     Conjunctiva/sclera: Conjunctivae normal.  Neck:     Vascular: No JVD.  Cardiovascular:     Rate and Rhythm: Normal rate. Rhythm irregular.     Pulses: Normal pulses and intact distal pulses.     Heart sounds: Heart sounds are distant. No murmur heard.   Pulmonary:     Effort: Pulmonary effort is normal.     Breath sounds: Normal breath sounds. No wheezing or rales.  Abdominal:     General: Bowel sounds are normal.     Palpations: Abdomen is soft.     Tenderness: There is no rebound.  Musculoskeletal:        General: No tenderness. Normal range of motion.     Right lower leg: No edema.     Left lower leg: No edema.  Lymphadenopathy:     Cervical: No cervical adenopathy.  Skin:    General: Skin is warm and dry.  Neurological:     General: No focal deficit present.     Mental Status: He is alert and oriented to person, place, and time.     Comments: Oriented, but  confused     CMP Latest Ref Rng & Units 09/28/2020 09/27/2020 09/27/2020  Glucose 70 - 99 mg/dL 159(H) - 110(H)  BUN 8 - 23 mg/dL 23 - 22  Creatinine 0.61 - 1.24 mg/dL 0.98 - 1.05  Sodium 135 - 145 mmol/L 137 141 141  Potassium 3.5 - 5.1 mmol/L 4.2 3.9 3.5  Chloride 98 - 111 mmol/L 107 - 106  CO2 22 - 32 mmol/L 19(L) - 25  Calcium 8.9 - 10.3 mg/dL 8.9 - 9.2   CBC Latest Ref Rng & Units 09/28/2020 09/27/2020 09/27/2020  WBC 4.0 - 10.5 K/uL 10.8(H) - 7.5  Hemoglobin 13.0 - 17.0 g/dL 14.8 14.6 15.7  Hematocrit 39.0 - 52.0 % 45.2 43.0 47.7  Platelets 150 - 400 K/uL 158 - 183   Lipid Panel  No results found for: CHOL, TRIG, HDL, CHOLHDL, VLDL, LDLCALC, LDLDIRECT HEMOGLOBIN A1C No results found for: HGBA1C, MPG TSH Recent Labs    09/25/20 1833  TSH 1.660    CT scan of the head 09/28/2020: 1. Bilateral subdural hematoma evacuation with prominent degree of pneumocephalus causing marks effect on the frontal lobes, please correlate for clinical signs of tension. 2. Kinking of the right subdural catheter with tip invested in right frontal sulci  CARDIAC STUDIES:  Telemetry: Atrial fibrillation at a  rate of approximately 100--120 bpm  Echocardiogram 09/26/2020: 1. Mildly dilated left ventricule with normal wall thickness. Severe  global hypokinesis, LVEF 25 to 30%. No LV thrombus seen. Left ventricular diastolic  parameters are indeterminate due to atrial fibrillation.  2. Right ventricular systolic function is low normal. The right  ventricular size is normal.  3. Left atrial size was mildly dilated.  4. Right atrial size was mildly dilated.  5. The mitral valve is grossly normal. Mild mitral valve regurgitation.  6. Likely bicuspid aortic valve with severe calcification with low flow low gradient aortic stenosis.Aortic valve area, by VTI measures 0.92 cm. Aortic valve Vmax  measures 2.08 m/s.   EKG 09/25/2020: A. fib with RVR Possible old anterior infarct, poor R wave  progression Long QT interval  Chest x-ray 09/24/2020 (performed at [facility]): No acute intrathoracic process  Reviewed [facility] labs 09/24/2020: Glucose 110, BUN/Cr 15/0.93. EGFR 84. Na/K 140/3.5. Rest of the CMP normal H/H 15/46. MCV 98. Platelets 197 HbA1C 6.1% proBNP 7124 (0-125) TSH N/A  09/13/2019: Chol 209, TG 128, HDL 65, LDL 118   Scheduled Meds: . amiodarone  200 mg Oral BID  . atorvastatin  20 mg Oral Daily  . Chlorhexidine Gluconate Cloth  6 each Topical Daily  . digoxin  0.125 mg Oral Daily  . levETIRAcetam  500 mg Oral BID  . mouth rinse  15 mL Mouth Rinse BID  . metoprolol succinate  25 mg Oral QPM  . pantoprazole  40 mg Oral QHS  . senna  1 tablet Oral BID  . tamsulosin  0.4 mg Oral Daily   Continuous Infusions:  PRN Meds:.acetaminophen **OR** acetaminophen, HYDROcodone-acetaminophen, labetalol, morphine injection, ondansetron **OR** ondansetron (ZOFRAN) IV, promethazine  Assessment & Recommendations:  Mike Thomas is a 70 y.o. Caucasian male patient with hypertension, hyperlipidemia, admitted with bilateral subdural hematomas, found to have A. fib, HFrEF, low-flow low gradient aortic stenosis  Persistent A. Fib: CHA2DS2-VASc Score is 3.  Yearly risk of stroke: 3.2% (A, HTN, CHF).  Score of 1=0.6; 2=2.2; 3=3.2; 4=4.8; 5=7.2; 6=9.8; 7=>9.8) -(CHF; HTN; vasc disease DM,  Male = 1; Age <65 =0; 65-74 = 1,  >75 =2; stroke/embolism= 2).    Presently tolerating amiodarone, upon discharge reduce it to 200 mg daily.  Continue digoxin 0.125 mg daily and metoprolol succinate 25 mg in the evening.   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  Presently not in acute decompensated heart failure.  Also suspect his aortic stenosis to be mild.  Presently not on ACE inhibitor's or ARB in view of low blood pressure.   Hypertension, primary, presently borderline low  blood pressure since surgery, on low-dose beta-blocker in view of low blood pressure.   We will see him back in the office upon discharge.  Continue present medications.  I have sent in for amiodarone Rx and set him up for office visit on 10/17/2020, entered and AVS.    Adrian Prows, MD, [facility] 10/01/2020, 3:29 PM Office: [phone] Pager: [phone]

## 2020-10-02 LAB — CBC
HCT: 48.4 % (ref 39.0–52.0)
Hemoglobin: 17.2 g/dL — ABNORMAL HIGH (ref 13.0–17.0)
MCH: 33.9 pg (ref 26.0–34.0)
MCHC: 35.5 g/dL (ref 30.0–36.0)
MCV: 95.5 fL (ref 80.0–100.0)
Platelets: 158 10*3/uL (ref 150–400)
RBC: 5.07 MIL/uL (ref 4.22–5.81)
RDW: 13 % (ref 11.5–15.5)
WBC: 7.7 10*3/uL (ref 4.0–10.5)
nRBC: 0 % (ref 0.0–0.2)

## 2020-10-02 LAB — CREATININE, SERUM
Creatinine, Ser: 0.95 mg/dL (ref 0.61–1.24)
GFR, Estimated: >60 mL/min (ref >60)

## 2020-10-02 MED ORDER — HEPARIN SODIUM (PORCINE) 5000 UNIT/ML IJ SOLN
5000.0000 [IU] | Freq: Three times a day (TID) | INTRAMUSCULAR | Status: DC
  Administered 2020-10-02 – 2020-10-03 (×4): 5000 [IU] via SUBCUTANEOUS
  Filled 2020-10-02 (×4): qty 1

## 2020-10-02 NOTE — Progress Notes (Signed)
Inpatient Rehab Admissions:  Inpatient Rehab Consult received.  I met with patient at the bedside for rehabilitation assessment and to discuss goals and expectations of an inpatient rehab admission.  Pt is confused throughout our conversation.  Able to answer basic questions about PLOF and home set up, but perseverative on pre-morbid calf pain that eased up 30 minutes after waking.  When I reoriented to the hospitalization and surgery for his brain injury, there appeared to be zero carryover as he immediately responded, "yeah, but once that calf pain is better I can run circles." Discussed with Margo Aye, NP, and this appears to be consistent throughout his hospitalization.  I spoke to his sister and his nephew on the phone, and family is prepared to provide 24/7 supervision at discharge.  Pt can stay with sister in her home, she has a handicap accessible suite on the first level.  I will open insurance for prior auth today.   Signed: Shann Medal, PT, DPT Admissions Coordinator [phone] 10/02/20  1:27 PM
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
## 2020-10-02 NOTE — Progress Notes (Signed)
Subjective: Patient reports mild headache, no NTW  Objective: Vital signs in last 24 hours: Temp:  [97.6 F (36.4 C)-98.4 F (36.9 C)] 97.9 F (36.6 C) (03/09 0803) Pulse Rate:  [35-88] 73 (03/09 0803) Resp:  [0-18] 18 (03/09 0803) BP: (109-124)/(78-92) 124/86 (03/09 0803) SpO2:  [97 %-100 %] 98 % (03/09 0803) Weight:  [85.8 kg] 85.8 kg (03/08 1406)  Intake/Output from previous day: 03/08 0701 - 03/09 0700 In: 240 [P.O.:240] Out: 1350 [Urine:1350] Intake/Output this shift: No intake/output data recorded.  Neurologic: Grossly normal  Lab Results: Lab Results  Component Value Date   WBC 10.8 (H) 09/28/2020   HGB 14.8 09/28/2020   HCT 45.2 09/28/2020   MCV 101.1 (H) 09/28/2020   PLT 158 09/28/2020   Lab Results  Component Value Date   INR 1.1 09/27/2020   BMET Lab Results  Component Value Date   NA 137 09/28/2020   K 4.2 09/28/2020   CL 107 09/28/2020   CO2 19 (L) 09/28/2020   GLUCOSE 159 (H) 09/28/2020   BUN 23 09/28/2020   CREATININE 0.98 09/28/2020   CALCIUM 8.9 09/28/2020    Studies/Results: VAS [place] LOWER EXTREMITY VENOUS (DVT)  Result Date: 10/01/2020  Lower Venous DVT Study Indications: Swelling, and Edema.  Comparison Study: no prior Performing Technologist: Abram Sander RVS  Examination Guidelines: A complete evaluation includes B-mode imaging, spectral Doppler, color Doppler, and power Doppler as needed of all accessible portions of each vessel. Bilateral testing is considered an integral part of a complete examination. Limited examinations for reoccurring indications may be performed as noted. The reflux portion of the exam is performed with the patient in reverse Trendelenburg.  +---------+---------------+---------+-----------+----------+--------------+ RIGHT    CompressibilityPhasicitySpontaneityPropertiesThrombus Aging +---------+---------------+---------+-----------+----------+--------------+ CFV      Full           Yes      Yes                                  +---------+---------------+---------+-----------+----------+--------------+ SFJ      Full                                                        +---------+---------------+---------+-----------+----------+--------------+ FV Prox  Full                                                        +---------+---------------+---------+-----------+----------+--------------+ FV Mid   Full                                                        +---------+---------------+---------+-----------+----------+--------------+ FV DistalFull                                                        +---------+---------------+---------+-----------+----------+--------------+ PFV  Full                                                        +---------+---------------+---------+-----------+----------+--------------+ POP      Full           Yes      Yes                                 +---------+---------------+---------+-----------+----------+--------------+ PTV      Full                                                        +---------+---------------+---------+-----------+----------+--------------+ PERO     Full                                                        +---------+---------------+---------+-----------+----------+--------------+   +---------+---------------+---------+-----------+----------+--------------+ LEFT     CompressibilityPhasicitySpontaneityPropertiesThrombus Aging +---------+---------------+---------+-----------+----------+--------------+ CFV      Full           Yes      Yes                                 +---------+---------------+---------+-----------+----------+--------------+ SFJ      Full                                                        +---------+---------------+---------+-----------+----------+--------------+ FV Prox  Full                                                         +---------+---------------+---------+-----------+----------+--------------+ FV Mid   Full                                                        +---------+---------------+---------+-----------+----------+--------------+ FV DistalFull                                                        +---------+---------------+---------+-----------+----------+--------------+ PFV      Full                                                        +---------+---------------+---------+-----------+----------+--------------+  POP      Full           Yes      Yes                                 +---------+---------------+---------+-----------+----------+--------------+ PTV      Full                                                        +---------+---------------+---------+-----------+----------+--------------+ PERO     Full                                                        +---------+---------------+---------+-----------+----------+--------------+     Summary: BILATERAL: - No evidence of deep vein thrombosis seen in the lower extremities, bilaterally. - No evidence of superficial venous thrombosis in the lower extremities, bilaterally. -No evidence of popliteal cyst, bilaterally.   *See table(s) above for measurements and observations. Electronically signed by Deitra Mayo MD on 10/01/2020 at 5:03:23 PM.    Final     Assessment/Plan: Doing well, start sq heparin per pharmacy, await CIR  Estimated body mass index is 26.38 kg/m as calculated from the following:   Height as of 02/03/18: 5\' 11"  (1.803 m).   Weight as of this encounter: 85.8 kg.    LOS: 7 days    BILLEY WOJCIAK 10/02/2020, 8:51 AM

## 2020-10-02 NOTE — Plan of Care (Signed)

## 2020-10-03 ENCOUNTER — Inpatient Hospital Stay (HOSPITAL_COMMUNITY)
Admission: RE | Admit: 2020-10-03 | Discharge: 2020-10-22 | DRG: 945 | Disposition: A | Discharge status: Home Health | Payer: Medicare HMO | Source: Intra-hospital | Attending: Physical Medicine & Rehabilitation | Admitting: Physical Medicine & Rehabilitation

## 2020-10-03 ENCOUNTER — Encounter (HOSPITAL_COMMUNITY): Payer: Self-pay | Admitting: Physical Medicine & Rehabilitation

## 2020-10-03 DIAGNOSIS — I10 Essential (primary) hypertension: Secondary | ICD-10-CM | POA: Diagnosis present

## 2020-10-03 DIAGNOSIS — I4892 Unspecified atrial flutter: Secondary | ICD-10-CM | POA: Diagnosis present

## 2020-10-03 DIAGNOSIS — K5903 Drug induced constipation: Secondary | ICD-10-CM | POA: Diagnosis present

## 2020-10-03 DIAGNOSIS — Z806 Family history of leukemia: Secondary | ICD-10-CM

## 2020-10-03 DIAGNOSIS — S065XAA Traumatic subdural hemorrhage with loss of consciousness status unknown, initial encounter: Secondary | ICD-10-CM | POA: Diagnosis present

## 2020-10-03 DIAGNOSIS — F17210 Nicotine dependence, cigarettes, uncomplicated: Secondary | ICD-10-CM | POA: Diagnosis present

## 2020-10-03 DIAGNOSIS — I350 Nonrheumatic aortic (valve) stenosis: Secondary | ICD-10-CM | POA: Diagnosis present

## 2020-10-03 DIAGNOSIS — R339 Retention of urine, unspecified: Secondary | ICD-10-CM

## 2020-10-03 DIAGNOSIS — Z833 Family history of diabetes mellitus: Secondary | ICD-10-CM

## 2020-10-03 DIAGNOSIS — S065X9A Traumatic subdural hemorrhage with loss of consciousness of unspecified duration, initial encounter: Secondary | ICD-10-CM

## 2020-10-03 DIAGNOSIS — G9389 Other specified disorders of brain: Secondary | ICD-10-CM | POA: Diagnosis present

## 2020-10-03 DIAGNOSIS — Z79899 Other long term (current) drug therapy: Secondary | ICD-10-CM

## 2020-10-03 DIAGNOSIS — E871 Hypo-osmolality and hyponatremia: Secondary | ICD-10-CM | POA: Diagnosis present

## 2020-10-03 DIAGNOSIS — Z800 Family history of malignant neoplasm of digestive organs: Secondary | ICD-10-CM | POA: Diagnosis not present

## 2020-10-03 DIAGNOSIS — W19XXXD Unspecified fall, subsequent encounter: Secondary | ICD-10-CM | POA: Diagnosis present

## 2020-10-03 DIAGNOSIS — I4891 Unspecified atrial fibrillation: Secondary | ICD-10-CM | POA: Diagnosis present

## 2020-10-03 DIAGNOSIS — R8279 Other abnormal findings on microbiological examination of urine: Secondary | ICD-10-CM | POA: Diagnosis not present

## 2020-10-03 DIAGNOSIS — N401 Enlarged prostate with lower urinary tract symptoms: Secondary | ICD-10-CM | POA: Diagnosis present

## 2020-10-03 DIAGNOSIS — Z8249 Family history of ischemic heart disease and other diseases of the circulatory system: Secondary | ICD-10-CM | POA: Diagnosis not present

## 2020-10-03 DIAGNOSIS — R259 Unspecified abnormal involuntary movements: Secondary | ICD-10-CM | POA: Diagnosis not present

## 2020-10-03 DIAGNOSIS — G441 Vascular headache, not elsewhere classified: Secondary | ICD-10-CM

## 2020-10-03 DIAGNOSIS — N179 Acute kidney failure, unspecified: Secondary | ICD-10-CM

## 2020-10-03 DIAGNOSIS — R251 Tremor, unspecified: Secondary | ICD-10-CM | POA: Diagnosis present

## 2020-10-03 DIAGNOSIS — I214 Non-ST elevation (NSTEMI) myocardial infarction: Secondary | ICD-10-CM | POA: Diagnosis present

## 2020-10-03 DIAGNOSIS — S065X9D Traumatic subdural hemorrhage with loss of consciousness of unspecified duration, subsequent encounter: Principal | ICD-10-CM

## 2020-10-03 DIAGNOSIS — R338 Other retention of urine: Secondary | ICD-10-CM | POA: Diagnosis present

## 2020-10-03 HISTORY — DX: Acute kidney failure, unspecified: N17.9

## 2020-10-03 HISTORY — DX: Headache, unspecified: R51.9

## 2020-10-03 LAB — URINALYSIS, ROUTINE W REFLEX MICROSCOPIC
Bilirubin Urine: NEGATIVE
Glucose, UA: NEGATIVE mg/dL
Hgb urine dipstick: NEGATIVE
Ketones, ur: NEGATIVE mg/dL
Leukocytes,Ua: NEGATIVE
Nitrite: NEGATIVE
Protein, ur: NEGATIVE mg/dL
Specific Gravity, Urine: 1.017 (ref 1.005–1.030)
pH: 6 (ref 5.0–8.0)

## 2020-10-03 MED ORDER — LEVETIRACETAM 500 MG PO TABS
500.0000 mg | ORAL_TABLET | Freq: Two times a day (BID) | ORAL | Status: DC
  Administered 2020-10-03 – 2020-10-22 (×38): 500 mg via ORAL
  Filled 2020-10-03 (×21): qty 1
  Filled 2020-10-03: qty 2
  Filled 2020-10-03 (×16): qty 1

## 2020-10-03 MED ORDER — SENNOSIDES-DOCUSATE SODIUM 8.6-50 MG PO TABS
2.0000 | ORAL_TABLET | Freq: Every day | ORAL | Status: DC
  Administered 2020-10-03 – 2020-10-17 (×15): 2 via ORAL
  Filled 2020-10-03 (×15): qty 2

## 2020-10-03 MED ORDER — HYDROCODONE-ACETAMINOPHEN 5-325 MG PO TABS
1.0000 | ORAL_TABLET | ORAL | Status: DC | PRN
  Administered 2020-10-03 – 2020-10-05 (×7): 1 via ORAL
  Filled 2020-10-03 (×7): qty 1

## 2020-10-03 MED ORDER — FLEET ENEMA 7-19 GM/118ML RE ENEM: 1.0000 | ENEMA | Freq: Once | RECTAL | Status: DC | PRN

## 2020-10-03 MED ORDER — TAMSULOSIN HCL 0.4 MG PO CAPS
0.8000 mg | ORAL_CAPSULE | Freq: Every day | ORAL | Status: DC
  Administered 2020-10-03: 0.8 mg via ORAL

## 2020-10-03 MED ORDER — HEPARIN SODIUM (PORCINE) 5000 UNIT/ML IJ SOLN
5000.0000 [IU] | Freq: Three times a day (TID) | INTRAMUSCULAR | Status: DC
  Administered 2020-10-03 – 2020-10-13 (×29): 5000 [IU] via SUBCUTANEOUS
  Filled 2020-10-03 (×29): qty 1

## 2020-10-03 MED ORDER — SORBITOL 70 % SOLN
30.0000 mL | Status: DC | PRN
  Administered 2020-10-04 – 2020-10-18 (×4): 30 mL via ORAL
  Filled 2020-10-03 (×6): qty 30

## 2020-10-03 MED ORDER — ORAL CARE MOUTH RINSE
15.0000 mL | Freq: Two times a day (BID) | OROMUCOSAL | Status: DC
  Administered 2020-10-03 – 2020-10-22 (×35): 15 mL via OROMUCOSAL

## 2020-10-03 MED ORDER — CHLORHEXIDINE GLUCONATE CLOTH 2 % EX PADS: 6.0000 | MEDICATED_PAD | Freq: Every day | CUTANEOUS | Status: DC

## 2020-10-03 MED ORDER — PROCHLORPERAZINE EDISYLATE 10 MG/2ML IJ SOLN: 5.0000 mg | Freq: Four times a day (QID) | INTRAMUSCULAR | Status: DC | PRN

## 2020-10-03 MED ORDER — LIDOCAINE HCL URETHRAL/MUCOSAL 2 % EX GEL: CUTANEOUS | Status: DC | PRN

## 2020-10-03 MED ORDER — DIGOXIN 125 MCG PO TABS
0.1250 mg | ORAL_TABLET | Freq: Every day | ORAL | Status: DC
  Administered 2020-10-04 – 2020-10-22 (×18): 0.125 mg via ORAL
  Filled 2020-10-03 (×19): qty 1

## 2020-10-03 MED ORDER — DIPHENHYDRAMINE HCL 12.5 MG/5ML PO ELIX: 12.5000 mg | ORAL_SOLUTION | Freq: Four times a day (QID) | ORAL | Status: DC | PRN

## 2020-10-03 MED ORDER — AMIODARONE HCL 200 MG PO TABS
200.0000 mg | ORAL_TABLET | Freq: Every day | ORAL | Status: DC
  Administered 2020-10-04 – 2020-10-22 (×19): 200 mg via ORAL
  Filled 2020-10-03 (×19): qty 1

## 2020-10-03 MED ORDER — PROSOURCE PLUS PO LIQD
30.0000 mL | Freq: Two times a day (BID) | ORAL | Status: DC
  Administered 2020-10-05 – 2020-10-21 (×32): 30 mL via ORAL
  Filled 2020-10-03 (×35): qty 30

## 2020-10-03 MED ORDER — ATORVASTATIN CALCIUM 10 MG PO TABS
20.0000 mg | ORAL_TABLET | Freq: Every day | ORAL | Status: DC
  Administered 2020-10-04 – 2020-10-22 (×19): 20 mg via ORAL
  Filled 2020-10-03 (×20): qty 2

## 2020-10-03 MED ORDER — GUAIFENESIN-DM 100-10 MG/5ML PO SYRP: 5.0000 mL | ORAL_SOLUTION | Freq: Four times a day (QID) | ORAL | Status: DC | PRN

## 2020-10-03 MED ORDER — METOPROLOL SUCCINATE ER 25 MG PO TB24
25.0000 mg | ORAL_TABLET | Freq: Every evening | ORAL | Status: DC
  Administered 2020-10-03 – 2020-10-21 (×18): 25 mg via ORAL
  Filled 2020-10-03 (×19): qty 1

## 2020-10-03 MED ORDER — TRAZODONE HCL 50 MG PO TABS
25.0000 mg | ORAL_TABLET | Freq: Every evening | ORAL | Status: DC | PRN
  Administered 2020-10-05 – 2020-10-07 (×3): 50 mg via ORAL
  Filled 2020-10-03 (×4): qty 1

## 2020-10-03 MED ORDER — ACETAMINOPHEN 325 MG PO TABS
325.0000 mg | ORAL_TABLET | ORAL | Status: DC | PRN
  Administered 2020-10-05 – 2020-10-19 (×12): 650 mg via ORAL
  Filled 2020-10-03 (×13): qty 2

## 2020-10-03 MED ORDER — BISACODYL 10 MG RE SUPP
10.0000 mg | Freq: Every day | RECTAL | Status: DC | PRN
  Administered 2020-10-04 – 2020-10-12 (×2): 10 mg via RECTAL
  Filled 2020-10-03 (×2): qty 1

## 2020-10-03 MED ORDER — ALUM & MAG HYDROXIDE-SIMETH 200-200-20 MG/5ML PO SUSP: 30.0000 mL | ORAL | Status: DC | PRN

## 2020-10-03 MED ORDER — POLYETHYLENE GLYCOL 3350 17 G PO PACK
17.0000 g | PACK | Freq: Every day | ORAL | Status: DC | PRN
  Administered 2020-10-09 – 2020-10-14 (×3): 17 g via ORAL
  Filled 2020-10-03 (×3): qty 1

## 2020-10-03 MED ORDER — PANTOPRAZOLE SODIUM 40 MG PO TBEC
40.0000 mg | DELAYED_RELEASE_TABLET | Freq: Every day | ORAL | Status: DC
  Administered 2020-10-03 – 2020-10-21 (×19): 40 mg via ORAL
  Filled 2020-10-03 (×20): qty 1

## 2020-10-03 MED ORDER — PROCHLORPERAZINE MALEATE 5 MG PO TABS: 5.0000 mg | ORAL_TABLET | Freq: Four times a day (QID) | ORAL | Status: DC | PRN

## 2020-10-03 MED ORDER — PROCHLORPERAZINE 25 MG RE SUPP: 12.5000 mg | Freq: Four times a day (QID) | RECTAL | Status: DC | PRN

## 2020-10-03 MED ORDER — TAMSULOSIN HCL 0.4 MG PO CAPS
0.8000 mg | ORAL_CAPSULE | Freq: Every day | ORAL | Status: DC
  Administered 2020-10-04 – 2020-10-21 (×18): 0.8 mg via ORAL
  Filled 2020-10-03 (×18): qty 2

## 2020-10-03 NOTE — Progress Notes (Signed)
Subjective: Patient reports no acute events overnight. Doing well  Objective: Vital signs in last 24 hours: Temp:  [97.6 F (36.4 C)-98.4 F (36.9 C)] 97.6 F (36.4 C) (03/10 0812) Pulse Rate:  [50-84] 50 (03/10 0812) Resp:  [16-18] 16 (03/10 0812) BP: (106-134)/(77-96) 113/86 (03/10 0812) SpO2:  [93 %-99 %] 97 % (03/10 0812)  Intake/Output from previous day: 03/09 0701 - 03/10 0700 In: -  Out: 1250 [Urine:1250] Intake/Output this shift: No intake/output data recorded.  Neurologic: Grossly normal, confusion at times  Lab Results: Lab Results  Component Value Date   WBC 7.7 10/02/2020   HGB 17.2 (H) 10/02/2020   HCT 48.4 10/02/2020   MCV 95.5 10/02/2020   PLT 158 10/02/2020   Lab Results  Component Value Date   INR 1.1 09/27/2020   BMET Lab Results  Component Value Date   NA 137 09/28/2020   K 4.2 09/28/2020   CL 107 09/28/2020   CO2 19 (L) 09/28/2020   GLUCOSE 159 (H) 09/28/2020   BUN 23 09/28/2020   CREATININE 0.95 10/02/2020   CALCIUM 8.9 09/28/2020    Studies/Results: VAS [place] LOWER EXTREMITY VENOUS (DVT)  Result Date: 10/01/2020  Lower Venous DVT Study Indications: Swelling, and Edema.  Comparison Study: no prior Performing Technologist: Abram Sander RVS  Examination Guidelines: A complete evaluation includes B-mode imaging, spectral Doppler, color Doppler, and power Doppler as needed of all accessible portions of each vessel. Bilateral testing is considered an integral part of a complete examination. Limited examinations for reoccurring indications may be performed as noted. The reflux portion of the exam is performed with the patient in reverse Trendelenburg.  +---------+---------------+---------+-----------+----------+--------------+ RIGHT    CompressibilityPhasicitySpontaneityPropertiesThrombus Aging +---------+---------------+---------+-----------+----------+--------------+ CFV      Full           Yes      Yes                                  +---------+---------------+---------+-----------+----------+--------------+ SFJ      Full                                                        +---------+---------------+---------+-----------+----------+--------------+ FV Prox  Full                                                        +---------+---------------+---------+-----------+----------+--------------+ FV Mid   Full                                                        +---------+---------------+---------+-----------+----------+--------------+ FV DistalFull                                                        +---------+---------------+---------+-----------+----------+--------------+ PFV      Full                                                        +---------+---------------+---------+-----------+----------+--------------+  POP      Full           Yes      Yes                                 +---------+---------------+---------+-----------+----------+--------------+ PTV      Full                                                        +---------+---------------+---------+-----------+----------+--------------+ PERO     Full                                                        +---------+---------------+---------+-----------+----------+--------------+   +---------+---------------+---------+-----------+----------+--------------+ LEFT     CompressibilityPhasicitySpontaneityPropertiesThrombus Aging +---------+---------------+---------+-----------+----------+--------------+ CFV      Full           Yes      Yes                                 +---------+---------------+---------+-----------+----------+--------------+ SFJ      Full                                                        +---------+---------------+---------+-----------+----------+--------------+ FV Prox  Full                                                         +---------+---------------+---------+-----------+----------+--------------+ FV Mid   Full                                                        +---------+---------------+---------+-----------+----------+--------------+ FV DistalFull                                                        +---------+---------------+---------+-----------+----------+--------------+ PFV      Full                                                        +---------+---------------+---------+-----------+----------+--------------+ POP      Full           Yes      Yes                                 +---------+---------------+---------+-----------+----------+--------------+  PTV      Full                                                        +---------+---------------+---------+-----------+----------+--------------+ PERO     Full                                                        +---------+---------------+---------+-----------+----------+--------------+     Summary: BILATERAL: - No evidence of deep vein thrombosis seen in the lower extremities, bilaterally. - No evidence of superficial venous thrombosis in the lower extremities, bilaterally. -No evidence of popliteal cyst, bilaterally.   *See table(s) above for measurements and observations. Electronically signed by Deitra Mayo MD on 10/01/2020 at 5:03:23 PM.    Final     Assessment/Plan: Postop day 6 bilateral crani for sdh. Doing well. Awaiting insurance approval for CIR. Will dc foley today to see how he does.   LOS: 8 days    Ocie Cornfield [facility] 10/03/2020, 9:18 AM

## 2020-10-03 NOTE — Progress Notes (Signed)
Inpatient Rehab Admissions Coordinator:    I have insurance approval and a bed available for pt to admit to CIR today.  Awaiting on final confirmation from Margo Aye, NP, but per documentation pt should be ready to go today.  Will let pt/family and TOC team know.   Shann Medal, PT, DPT Admissions Coordinator [phone] 10/03/20  10:27 AM
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
## 2020-10-03 NOTE — Progress Notes (Signed)
Inpatient Rehabilitation Medication Review by a Pharmacist  A complete drug regimen review was completed for this patient to identify any potential clinically significant medication issues.  Clinically significant medication issues were identified:  No.  Check AMION for pharmacist assigned to patient if future medication questions/issues arise during this admission.  Time spent performing this drug regimen review (minutes):  8 min.   Blenda Nicely, [place] Pharmacist 10/03/2020 8:19 PM

## 2020-10-03 NOTE — Progress Notes (Signed)
Pt amb in hallway w/ walker. Pt noted to have shuffling gait requiring ques to try to stay in the middle of walker while walking. Pt stopped a couple times w/o dyspnea, but at times mentioned how his left calf limits his walking d/t "it tightening up". Pt exhibited some elevated HR in the 120's while walking in the hallway.
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
## 2020-10-03 NOTE — Plan of Care (Signed)

## 2020-10-03 NOTE — Discharge Summary (Signed)
Physician Discharge Summary  [name] MRN: [id number] DOB/AGE: 1950/10/02 70 y.o.  Admit date: 09/25/2020 Discharge date: 10/03/2020  Admission Diagnoses: bilateral Subdural hematoma    Discharge Diagnoses: same   Discharged Condition: good  Hospital Course: The patient was admitted on 09/25/2020 and taken to the operating room where the patient underwent bilateral craniotomy for subdural hematoma. The patient tolerated the procedure well and was taken to the recovery room and then to the ICU in stable condition. The hospital course was routine. There were no complications. The wound remained clean dry and intact. Pt had appropriate head soreness. No complaints of arm pain or new N/T/W. The patient remained afebrile with stable vital signs, and tolerated a regular diet. The patient continued to increase activities, and pain was well controlled with oral pain medications.   Consults: None  Significant Diagnostic Studies:  Results for orders placed or performed during the hospital encounter of 09/25/20  MRSA PCR Screening   Specimen: Nasal Mucosa; Nasopharyngeal  Result Value Ref Range   MRSA by PCR NEGATIVE NEGATIVE  Surgical pcr screen   Specimen: Nasal Mucosa; Nasal Swab  Result Value Ref Range   MRSA, PCR NEGATIVE NEGATIVE   Staphylococcus aureus NEGATIVE NEGATIVE  TSH  Result Value Ref Range   TSH 1.660 0.350 - 4.500 uIU/mL  CBC  Result Value Ref Range   WBC 7.5 4.0 - 10.5 K/uL   RBC 4.70 4.22 - 5.81 MIL/uL   Hemoglobin 15.7 13.0 - 17.0 g/dL   HCT 47.7 39.0 - 52.0 %   MCV 101.5 (H) 80.0 - 100.0 fL   MCH 33.4 26.0 - 34.0 pg   MCHC 32.9 30.0 - 36.0 g/dL   RDW 13.6 11.5 - 15.5 %   Platelets 183 150 - 400 K/uL   nRBC 0.0 0.0 - 0.2 %  Basic metabolic panel  Result Value Ref Range   Sodium 141 135 - 145 mmol/L   Potassium 3.5 3.5 - 5.1 mmol/L   Chloride 106 98 - 111 mmol/L   CO2 25 22 - 32 mmol/L   Glucose, Bld 110 (H) 70 - 99 mg/dL   BUN 22 8 - 23 mg/dL    Creatinine, Ser 1.05 0.61 - 1.24 mg/dL   Calcium 9.2 8.9 - 10.3 mg/dL   GFR, Estimated >60 >60 mL/min   Anion gap 10 5 - 15  Protime-INR  Result Value Ref Range   Prothrombin Time 13.4 11.4 - 15.2 seconds   INR 1.1 0.8 - 1.2  Basic metabolic panel  Result Value Ref Range   Sodium 137 135 - 145 mmol/L   Potassium 4.2 3.5 - 5.1 mmol/L   Chloride 107 98 - 111 mmol/L   CO2 19 (L) 22 - 32 mmol/L   Glucose, Bld 159 (H) 70 - 99 mg/dL   BUN 23 8 - 23 mg/dL   Creatinine, Ser 0.98 0.61 - 1.24 mg/dL   Calcium 8.9 8.9 - 10.3 mg/dL   GFR, Estimated >60 >60 mL/min   Anion gap 11 5 - 15  CBC  Result Value Ref Range   WBC 10.8 (H) 4.0 - 10.5 K/uL   RBC 4.47 4.22 - 5.81 MIL/uL   Hemoglobin 14.8 13.0 - 17.0 g/dL   HCT 45.2 39.0 - 52.0 %   MCV 101.1 (H) 80.0 - 100.0 fL   MCH 33.1 26.0 - 34.0 pg   MCHC 32.7 30.0 - 36.0 g/dL   RDW 13.5 11.5 - 15.5 %   Platelets 158 150 -  400 K/uL   nRBC 0.0 0.0 - 0.2 %  CBC  Result Value Ref Range   WBC 7.7 4.0 - 10.5 K/uL   RBC 5.07 4.22 - 5.81 MIL/uL   Hemoglobin 17.2 (H) 13.0 - 17.0 g/dL   HCT 48.4 39.0 - 52.0 %   MCV 95.5 80.0 - 100.0 fL   MCH 33.9 26.0 - 34.0 pg   MCHC 35.5 30.0 - 36.0 g/dL   RDW 13.0 11.5 - 15.5 %   Platelets 158 150 - 400 K/uL   nRBC 0.0 0.0 - 0.2 %  Creatinine, serum  Result Value Ref Range   Creatinine, Ser 0.95 0.61 - 1.24 mg/dL   GFR, Estimated >60 >60 mL/min  I-STAT 7, (LYTES, BLD GAS, ICA, H+H)  Result Value Ref Range   pH, Arterial 7.351 7.350 - 7.450   pCO2 arterial 43.2 32.0 - 48.0 mmHg   pO2, Arterial 300 (H) 83.0 - 108.0 mmHg   Bicarbonate 24.3 20.0 - 28.0 mmol/L   TCO2 26 22 - 32 mmol/L   O2 Saturation 100.0 %   Acid-base deficit 2.0 0.0 - 2.0 mmol/L   Sodium 141 135 - 145 mmol/L   Potassium 3.9 3.5 - 5.1 mmol/L   Calcium, Ion 1.18 1.15 - 1.40 mmol/L   HCT 43.0 39.0 - 52.0 %   Hemoglobin 14.6 13.0 - 17.0 g/dL   Patient temperature 35.3 C    Sample type ARTERIAL   ECHOCARDIOGRAM COMPLETE  Result Value Ref  Range   BP 94/69 mmHg   AV Area VTI 0.92 cm2   AV Mean grad 10.5 mmHg   Single Plane A4C EF 41.7 %   S' Lateral 4.50 cm   Area-P 1/2 5.02 cm2   AR max vel 1.17 cm2   AV Peak grad 17.4 mmHg   Ao pk vel 2.08 m/s   AV Area mean vel 0.94 cm2  Type and screen [place]  Result Value Ref Range   ABO/RH(D) A POS    Antibody Screen NEG    Sample Expiration      09/28/2020,2359 Performed at [facility], [address], [place], [place] [address]   ABO/Rh  Result Value Ref Range   ABO/RH(D)      A POS Performed at [facility] [address] [place], [place] [address]     CT HEAD WO CONTRAST  Result Date: 09/30/2020 CLINICAL DATA:  Follow-up examination for subdural hematoma. EXAM: CT HEAD WITHOUT CONTRAST TECHNIQUE: Contiguous axial images were obtained from the base of the skull through the vertex without intravenous contrast. COMPARISON:  Prior CT from 09/29/2020. FINDINGS: Brain: Postoperative changes from prior bilateral subdural evacuation again seen. Subdural drains remain in place, stable in position, with the right drain partially kinked and invested at the posterior right frontal region. Continued slight interval decrease in pneumocephalus as compared to previous exam. Persistent peaking of the frontal lobes bilaterally. Otherwise, the residual bilateral subdural collections are not significantly changed in size from previous. Degree of residual high density blood products within the collections is not significantly changed, with no evidence for interval bleeding. Gel-Foam material noted subjacent to the craniotomy bone flaps. Remainder of the brain is stable in appearance with no new infarct or hemorrhage. No hydrocephalus or significant midline shift. Basilar cisterns remain patent. No herniation. Vascular: No hyperdense vessel. Calcified atherosclerosis present at skull base. Skull: Post craniotomy changes without adverse features. Sinuses/Orbits:  Globes and orbital soft tissues within normal limits. Mild  mucosal thickening within the ethmoidal air cells and maxillary sinuses. Mastoid air cells remain clear. Other: None. IMPRESSION: 1. Continued slight interval decrease in pneumocephalus as compared to previous exam. Otherwise, the residual bilateral subdural collections are not significantly changed in size from, with no evidence for interval bleeding. Subdural drains remain in place and are in stable position. 2. No other new acute intracranial abnormality. Electronically Signed   By: Jeannine Boga M.D.   On: 09/30/2020 02:20   CT HEAD WO CONTRAST  Result Date: 09/29/2020 CLINICAL DATA:  Follow-up examination for subdural hemorrhage. EXAM: CT HEAD WITHOUT CONTRAST TECHNIQUE: Contiguous axial images were obtained from the base of the skull through the vertex without intravenous contrast. COMPARISON:  Prior CT from 09/28/2020 FINDINGS: Brain: Postoperative changes from bilateral subdural evacuation again seen. Subdural drains remain in place. The left drain courses posteriorly and terminates over the left occipital convexity. Kinking of the right drain with the tip impregnated than the posterior right frontal region again seen, stable. There has been slight interval decrease in pneumocephalus from previous exam with associated slight interval decrease in size of bilateral subdural collections, now measuring up to approximately 2.4 cm on the right and 2.1 cm on the left. Persistent peaking of the frontal lobes. Degree of residual high density blood products is relatively similar. Gel-Foam material again noted subjacent to the craniotomy bone flaps. Remainder the brain is stable without new infarct, hemorrhage, hydrocephalus, or midline shift. Vascular: No hyperdense vessel. Calcified atherosclerosis at the skull base. Skull: Post craniotomy changes without complication. Sinuses/Orbits: Globes and orbital soft tissues demonstrate no acute finding.  Scattered mucosal thickening noted within the ethmoidal air cells and maxillary sinuses. Mastoid air cells remain clear. Other: None. IMPRESSION: 1. Slight interval decrease in size of bilateral subdural collections, largely due to slightly decreased pneumocephalus as compared to previous, although a fairly sizable amount still remains. Bilateral subdural drains remain in place with the right drain kinked and invested in the posterior right frontal region, stable. 2. No other new acute intracranial abnormality. Electronically Signed   By: Jeannine Boga M.D.   On: 09/29/2020 02:42   CT HEAD WO CONTRAST  Result Date: 09/28/2020 CLINICAL DATA:  Follow-up subdural hemorrhage. EXAM: CT HEAD WITHOUT CONTRAST TECHNIQUE: Contiguous axial images were obtained from the base of the skull through the vertex without intravenous contrast. COMPARISON:  Three days ago FINDINGS: Brain: Interval bilateral subdural hematoma evacuation with drain placement on both sides. The right-sided drain is flexed and closely applied to the brain without adjacent brain edema or parenchymal hemorrhage. The collections have increased since due to gas, measuring up to 3 cm in thickness and causing peaking of the frontal lobes. The degree of high-density blood products is diminished. Gel-Foam is seen beneath the bilateral craniotomy flaps which do not extend to any of the sinuses. No complicating infarct, hydrocephalus, or herniation. Vascular: Atheromatous calcification Skull: Unremarkable bilateral craniotomy. Sinuses/Orbits: Venous gas seen at the skull base. Other: Case discussed with RN Raquel Sarna and the patient is doing well. A call has been placed to the neuro surgery team. IMPRESSION: 1. Bilateral subdural hematoma evacuation with prominent degree of pneumocephalus causing marks effect on the frontal lobes, please correlate for clinical signs of tension. 2. Kinking of the right subdural catheter with tip invested in right frontal sulci  Electronically Signed   By: Monte Fantasia M.D.   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 [email or web address] Electronically Amended 09/26/2020, 12:13 PM   Final (Amended)    Result Date: 09/26/2020    ECHOCARDIOGRAM REPORT   Patient Name:   Chisom Candee Date of Exam: 09/26/2020 Medical Rec #:  [id number]     Height:       71.0 in Accession #:    [id number]    Weight:       194.0 lb Date of Birth:  03-11-1951      BSA:          2.081 m Patient Age:    48 years      BP:           172/95 mmHg Patient Gender: M             HR:           65 bpm. Exam Location:  Inpatient Procedure: 2D Echo, Cardiac Doppler and Color Doppler Indications:     Atrial fibrillation  History:         Patient has no prior history of Echocardiogram examinations.                  Arrythmias:Atrial Fibrillation; Signs/Symptoms:Altered Mental                  Status. Bilateral subdural hematoma.  Sonographer:     Dustin Flock RDCS Referring Phys:  [id number] [facility] J PATWARDHAN Diagnosing Phys: Vernell Leep MD  Sonographer Comments: No subcostal window. Confusion. Patient in restraints IMPRESSIONS  1. Mildly dilated left ventricule with normal wall thickness. Severe global hypokinesis, LVEF 25-Left ventricular ejection fraction, by estimation, is 25 to 30%. No LV thrombus seen. Left ventricular diastolic parameters are indeterminate due to atrial fibrillation.  2. Right ventricular systolic function is low normal. The right ventricular size is normal.  3. Left atrial size was mildly dilated.  4. Right atrial size was mildly dilated.  5. The mitral valve is grossly normal. Mild mitral valve regurgitation.  6. The aortic valve is abnormal. Aortic valve regurgitation is not visualized. Aortic valve area, by VTI measures 0.92 cm. Aortic valve Vmax measures 2.08 m/s. FINDINGS  Left Ventricle: Left ventricular ejection  fraction, by estimation, is 25 to 30%. The left ventricle has severely decreased function. The left ventricle demonstrates global hypokinesis. The left ventricular internal cavity size was mildly dilated. There is no left ventricular hypertrophy. Left ventricular diastolic parameters are indeterminate. Right Ventricle: The right ventricular size is normal. No increase in right ventricular wall thickness. Right ventricular systolic function is low normal. Left Atrium: Left atrial size was mildly dilated. Right Atrium: Right atrial size was mildly dilated. Pericardium: There is no evidence of pericardial effusion. Mitral Valve: The mitral valve is grossly normal. Mild mitral valve regurgitation. Tricuspid Valve: The tricuspid valve is grossly normal. Tricuspid valve regurgitation is mild. Aortic Valve: Likely bicuspid aortic valve with severe calcification. 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 The aortic valve is abnormal. Aortic valve regurgitation is not visualized. Aortic valve mean gradient measures 10.5 mmHg. Aortic valve peak gradient measures 17.4 mmHg. Aortic valve area, by VTI measures 0.92 cm. Pulmonic Valve: The pulmonic valve was grossly normal. Pulmonic valve regurgitation is not visualized. Aorta: The aortic root and ascending aorta are structurally normal, with no evidence of dilitation. Venous: The inferior vena cava was not well visualized. IAS/Shunts: No atrial level shunt detected by color flow Doppler.  LEFT VENTRICLE PLAX 2D LVIDd:  5.10 cm      Diastology LVIDs:         4.50 cm      LV e' medial:    4.35 cm/s LV PW:         1.10 cm      LV E/e' medial:  23.0 LV IVS:        1.10 cm      LV e' lateral:   3.59 cm/s LVOT diam:     2.40 cm      LV E/e' lateral: 27.9 LV SV:         33 LV SV Index:   16 LVOT Area:     4.52 cm  LV Volumes (MOD) LV vol d, MOD A4C: 192.0 ml LV vol s, MOD A4C: 112.0 ml LV SV MOD A4C:     192.0 ml RIGHT VENTRICLE RV Basal diam:  3.30 cm RV S prime:     6.09  cm/s TAPSE (M-mode): 3.4 cm LEFT ATRIUM             Index       RIGHT ATRIUM           Index LA diam:        4.30 cm 2.07 cm/m  RA Area:     26.70 cm LA Vol (A2C):   61.9 ml 29.74 ml/m RA Volume:   79.40 ml  38.15 ml/m LA Vol (A4C):   85.2 ml 40.93 ml/m LA Biplane Vol: 74.8 ml 35.94 ml/m  AORTIC VALVE AV Area (Vmax):    1.17 cm AV Area (Vmean):   0.94 cm AV Area (VTI):     0.92 cm AV Vmax:           208.33 cm/s AV Vmean:          149.500 cm/s AV VTI:            0.361 m AV Peak Grad:      17.4 mmHg AV Mean Grad:      10.5 mmHg LVOT Vmax:         53.90 cm/s LVOT Vmean:        31.200 cm/s LVOT VTI:          0.073 m LVOT/AV VTI ratio: 0.20  AORTA Ao Root diam: 3.50 cm MITRAL VALVE                TRICUSPID VALVE MV Area (PHT): 5.02 cm     TR Peak grad:   26.0 mmHg MV Decel Time: 151 msec     TR Vmax:        255.00 cm/s MV E velocity: 100.00 cm/s MV A velocity: 35.40 cm/s   SHUNTS MV E/A ratio:  2.82         Systemic VTI:  0.07 m                             Systemic Diam: 2.40 cm Vernell Leep MD Electronically signed by Vernell Leep MD Signature Date/Time: 09/26/2020/11:52:37 AM    Final (Amended)    VAS [place] LOWER EXTREMITY VENOUS (DVT)  Result Date: 10/01/2020  Lower Venous DVT Study Indications: Swelling, and Edema.  Comparison Study: no prior Performing Technologist: Abram Sander RVS  Examination Guidelines: A complete evaluation includes B-mode imaging, spectral Doppler, color Doppler, and power Doppler as needed of all accessible portions of each vessel. Bilateral testing is considered an integral part of a complete examination. Limited  examinations for reoccurring indications may be performed as noted. The reflux portion of the exam is performed with the patient in reverse Trendelenburg.  +---------+---------------+---------+-----------+----------+--------------+ RIGHT    CompressibilityPhasicitySpontaneityPropertiesThrombus Aging  +---------+---------------+---------+-----------+----------+--------------+ CFV      Full           Yes      Yes                                 +---------+---------------+---------+-----------+----------+--------------+ SFJ      Full                                                        +---------+---------------+---------+-----------+----------+--------------+ FV Prox  Full                                                        +---------+---------------+---------+-----------+----------+--------------+ FV Mid   Full                                                        +---------+---------------+---------+-----------+----------+--------------+ FV DistalFull                                                        +---------+---------------+---------+-----------+----------+--------------+ PFV      Full                                                        +---------+---------------+---------+-----------+----------+--------------+ POP      Full           Yes      Yes                                 +---------+---------------+---------+-----------+----------+--------------+ PTV      Full                                                        +---------+---------------+---------+-----------+----------+--------------+ PERO     Full                                                        +---------+---------------+---------+-----------+----------+--------------+   +---------+---------------+---------+-----------+----------+--------------+ LEFT     CompressibilityPhasicitySpontaneityPropertiesThrombus Aging +---------+---------------+---------+-----------+----------+--------------+ CFV      Full  Yes      Yes                                 +---------+---------------+---------+-----------+----------+--------------+ SFJ      Full                                                         +---------+---------------+---------+-----------+----------+--------------+ FV Prox  Full                                                        +---------+---------------+---------+-----------+----------+--------------+ FV Mid   Full                                                        +---------+---------------+---------+-----------+----------+--------------+ FV DistalFull                                                        +---------+---------------+---------+-----------+----------+--------------+ PFV      Full                                                        +---------+---------------+---------+-----------+----------+--------------+ POP      Full           Yes      Yes                                 +---------+---------------+---------+-----------+----------+--------------+ PTV      Full                                                        +---------+---------------+---------+-----------+----------+--------------+ PERO     Full                                                        +---------+---------------+---------+-----------+----------+--------------+     Summary: BILATERAL: - No evidence of deep vein thrombosis seen in the lower extremities, bilaterally. - No evidence of superficial venous thrombosis in the lower extremities, bilaterally. -No evidence of popliteal cyst, bilaterally.   *See table(s) above for measurements and observations. Electronically signed by Deitra Mayo MD on 10/01/2020 at 5:03:23 PM.    Final     Antibiotics:  Anti-infectives (From admission, onward)  Start     Dose/Rate Route Frequency Ordered Stop   09/28/20 0130  ceFAZolin (ANCEF) IVPB 1 g/50 mL premix        1 g 100 mL/hr over 30 Minutes Intravenous Every 8 hours 09/27/20 2045 09/28/20 1049   09/27/20 1704  ceFAZolin (ANCEF) 2-4 GM/100ML-% IVPB       Note to Pharmacy: Grace Blight   : cabinet override      09/27/20 1704 09/28/20 0514       Discharge Exam: Blood pressure 118/84, pulse 69, temperature 97.7 F (36.5 C), temperature source Oral, resp. rate 18, weight 85.8 kg, SpO2 99 %. Neurologic: Grossly normal, does still exhibit some confusion at times Ambulating well, incision cdi  Discharge Medications:   Allergies as of 10/03/2020   No Known Allergies     Medication List    STOP taking these medications   amLODipine 10 MG tablet Commonly known as: NORVASC     TAKE these medications   amiodarone 200 MG tablet Commonly known as: PACERONE Take 1 tablet (200 mg total) by mouth daily.   rosuvastatin 20 MG tablet Commonly known as: CRESTOR Take by mouth at bedtime.   tamsulosin 0.4 MG Caps capsule Commonly known as: FLOMAX Take 0.4 mg by mouth.   TYLENOL 8 HOUR ARTHRITIS PAIN PO Take 1 tablet by mouth daily as needed (For pain).       Disposition: CIR   Final Dx: bilateral crani for evacuation of subdural hematoma     Follow-up Information    Cantwell, Celeste C, PA-C Follow up on 10/16/2020.   Specialty: Cardiology Why: 1 PM Contact information: [place] [address] [phone]                Signed: Eleonore Chiquito 10/03/2020, 1:09 PM
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
## 2020-10-03 NOTE — TOC Transition Note (Signed)
Transition of Care [facility]) - CM/SW Discharge Note   Patient Details  Name: Mike Thomas MRN: [id number] Date of Birth: 07/07/1951  [facility] CM/SW Contact:  Pollie Friar, RN Phone Number: 10/03/2020, 2:01 PM   Clinical Narrative:    Patient discharging to CIR today. CM signing off.   Final next level of care: IP Rehab Facility Barriers to Discharge: No Barriers Identified   Patient Goals and CMS Choice     Choice offered to / list presented to : Patient  Discharge Placement                       Discharge Plan and Services                                     Social Determinants of Health (SDOH) Interventions     Readmission Risk Interventions No flowsheet data found.

## 2020-10-03 NOTE — Progress Notes (Signed)
Patient arrived on [address] around 1700, assigned to 4W06. Patient appears alert and oriented and denies pain.

## 2020-10-03 NOTE — Plan of Care (Signed)
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
## 2020-10-04 LAB — COMPREHENSIVE METABOLIC PANEL
ALT: 27 U/L (ref 0–44)
AST: 16 U/L (ref 15–41)
Albumin: 2.8 g/dL — ABNORMAL LOW (ref 3.5–5.0)
Alkaline Phosphatase: 45 U/L (ref 38–126)
Anion gap: 7 (ref 5–15)
BUN: 19 mg/dL (ref 8–23)
CO2: 25 mmol/L (ref 22–32)
Calcium: 8.6 mg/dL — ABNORMAL LOW (ref 8.9–10.3)
Chloride: 102 mmol/L (ref 98–111)
Creatinine, Ser: 0.98 mg/dL (ref 0.61–1.24)
GFR, Estimated: >60 mL/min (ref >60)
Glucose, Bld: 115 mg/dL — ABNORMAL HIGH (ref 70–99)
Potassium: 3.9 mmol/L (ref 3.5–5.1)
Sodium: 134 mmol/L — ABNORMAL LOW (ref 135–145)
Total Bilirubin: 0.8 mg/dL (ref 0.3–1.2)
Total Protein: 5.3 g/dL — ABNORMAL LOW (ref 6.5–8.1)

## 2020-10-04 LAB — CBC WITH DIFFERENTIAL/PLATELET
Abs Immature Granulocytes: 0.01 10*3/uL (ref 0.00–0.07)
Basophils Absolute: 0 10*3/uL (ref 0.0–0.1)
Basophils Relative: 0 %
Eosinophils Absolute: 0.1 10*3/uL (ref 0.0–0.5)
Eosinophils Relative: 1 %
HCT: 46.9 % (ref 39.0–52.0)
Hemoglobin: 16.6 g/dL (ref 13.0–17.0)
Immature Granulocytes: 0 %
Lymphocytes Relative: 18 %
Lymphs Abs: 1.4 10*3/uL (ref 0.7–4.0)
MCH: 33.8 pg (ref 26.0–34.0)
MCHC: 35.4 g/dL (ref 30.0–36.0)
MCV: 95.5 fL (ref 80.0–100.0)
Monocytes Absolute: 0.6 10*3/uL (ref 0.1–1.0)
Monocytes Relative: 8 %
Neutro Abs: 5.8 10*3/uL (ref 1.7–7.7)
Neutrophils Relative %: 73 %
Platelets: 162 10*3/uL (ref 150–400)
RBC: 4.91 MIL/uL (ref 4.22–5.81)
RDW: 12.8 % (ref 11.5–15.5)
WBC: 7.9 10*3/uL (ref 4.0–10.5)
nRBC: 0 % (ref 0.0–0.2)

## 2020-10-04 MED ORDER — SORBITOL 70 % SOLN
45.0000 mL | Freq: Once | Status: AC
  Administered 2020-10-04: 45 mL via ORAL

## 2020-10-04 NOTE — Progress Notes (Signed)
Inpatient Rehabilitation Care Coordinator Assessment and Plan Patient Details  Name: Mike Thomas MRN: [id number] Date of Birth: [birth year]  Today's Date: 10/04/2020  Hospital Problems: Principal Problem:   Bilateral subdural hematomas [facility])  Past Medical History:  Past Medical History:  Diagnosis Date  . Allergy   . Headache   . Hx of migraines   . Hyperlipidemia   . Hypertension    Past Surgical History:  Past Surgical History:  Procedure Laterality Date  . CRANIOTOMY Bilateral 09/27/2020   Procedure: BILATERAL CRANIOTOMY FOR SUBDURAL HEMATOMA EVACUATION;  Surgeon: Eustace Moore, MD;  Location: [place];  Service: Neurosurgery;  Laterality: Bilateral;  . KNEE SURGERY Left 1971  . TONSILLECTOMY     Social History:  reports that he has been smoking cigarettes. He has a 15.00 pack-year smoking history. He has never used smokeless tobacco. He reports current alcohol use of about 20.0 standard drinks of alcohol per week. He reports that he does not use drugs.  Family / Support Systems Patient Roles: Spouse,Partner Spouse/Significant Other: Joelene Millin (S.O.) Other Supports: [name] (sister) Anticipated Caregiver: Jolyn Nap Ability/Limitations of Caregiver: Supervision to [employer] Caregiver Availability: 24/7  Social History Preferred language: English Religion:  Read: Yes Write: Yes Employment Status: Retired   Abuse/Neglect Abuse/Neglect Assessment Can Be Completed: Yes Physical Abuse: Denies Verbal Abuse: Denies Sexual Abuse: Denies Exploitation of patient/patient's resources: Denies Self-Neglect: Denies  Emotional Status    Patient / Family Perceptions, Expectations & Goals Premorbid pt/family roles/activities: previously independent and active in comm few times a week, driving, no dme Pt/family expectations/goals: supervision  [employer]: None Premorbid Home Care/DME Agencies: Other (Comment) (rolling walker,  [facility]) Transportation available at discharge: family able to transport Resource referrals recommended: Neuropsychology (coping)  Discharge Planning Living Arrangements: Spouse/significant other,Other relatives Support Systems: Spouse/significant other,Other relatives (Girlfriend and sister) Type of Residence: Private residence (1 level home, 1level entry OR option to d.c to sister's home) Insurance Resources: Multimedia programmer (specify),Medicaid (specify county) ([place] medicare // Acupuncturist) Financial Resources: Social Security,SSD Financial Screen Referred: No Living Expenses: Own Money Management: Patient Does the patient have any problems obtaining your medications?: No Home Management: Previosuly independent Patient/Family Preliminary Plans: Some assistance Care Coordinator Anticipated Follow Up Needs: HH/OP Expected length of stay: 12-14 days  Clinical Impression Covering for primary sw, Auria. SW met with patient, called patient sister and nephew at bedside. Introduced self, explained role, addressed questions and concerns. Patient plans to d/c home with sister and girlfriend to provide care. 1 level home, entry level. Patient also has the choice to d/c to sister's home. No additional questions or concerns, family will be here today to visit patient.   Dyanne Iha 10/04/2020, 1:06 PM

## 2020-10-04 NOTE — Progress Notes (Signed)
PROGRESS NOTE   Subjective/Complaints: No complaints this morning Discussed staple removal on day 14, surgery was 3/4 (he could not recall date) Na 134   Objective:   No results found. Recent Labs    10/02/20 0908 10/04/20 0512  WBC 7.7 7.9  HGB 17.2* 16.6  HCT 48.4 46.9  PLT 158 162   Recent Labs    10/02/20 0908 10/04/20 0512  NA  --  134*  K  --  3.9  CL  --  102  CO2  --  25  GLUCOSE  --  115*  BUN  --  19  CREATININE 0.95 0.98  CALCIUM  --  8.6*    Intake/Output Summary (Last 24 hours) at 10/04/2020 1118 Last data filed at 10/04/2020 0735 Gross per 24 hour  Intake 460 ml  Output 825 ml  Net -365 ml        Physical Exam: Vital Signs Blood pressure 102/75, pulse 68, temperature 97.7 F (36.5 C), temperature source Oral, resp. rate 18, SpO2 98 %. Gen: no distress, normal appearing HEENT: oral mucosa pink and moist, NCAT Cardio: Reg rate Chest: normal effort, normal rate of breathing Abd: soft, non-distended Ext: no edema Psych: pleasant, normal affect Skin: intact Genitourinary:    Comments: Has condom catheter draining light amber urine Musculoskeletal:     Cervical back: Normal range of motion and neck supple.     Comments: UEs 5/5 B/L in biceps, triceps, WE grip and finger abd B/L LEs- 5-/5 in HF, KE, DF and PF B/L  Skin:    Comments: RUE IV in upper arm- looks OK Buttocks and heels look great- no skin breakdown  Neurological:     Mental Status: He is alert and oriented to person, place, and time.     Comments: Speech clear and able to answer orientation questions without difficulty. .   Appropriate speech, but decreased awareness of deficits- asking to go home now and drive.  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 Rapid alternating movements intact but slowed b/L Intact to light touch in  all 4 extremities  Psychiatric:     Comments: Flat affect- appropriate     Assessment/Plan: 1. Functional deficits which require 3+ hours per day of interdisciplinary therapy in a comprehensive inpatient rehab setting.  Physiatrist is providing close team supervision and 24 hour management of active medical problems listed below.  Physiatrist and rehab team continue to assess barriers to discharge/monitor patient progress toward functional and medical goals  Care Tool:  Bathing  Bathing activity did not occur: Refused           Bathing assist       Upper Body Dressing/Undressing Upper body dressing   What is the patient wearing?: Pull over shirt    Upper body assist Assist Level: Supervision/Verbal cueing    Lower Body Dressing/Undressing Lower body dressing      What is the patient wearing?: Pants     Lower body assist Assist for lower body dressing: Moderate Assistance - Patient 50 - 74%  Toileting Toileting    Toileting assist Assist for toileting: Moderate Assistance - Patient 50 - 74%     Transfers Chair/bed transfer  Transfers assist     Chair/bed transfer assist level: Moderate Assistance - Patient 50 - 74%     Locomotion Ambulation   Ambulation assist              Walk 10 feet activity   Assist           Walk 50 feet activity   Assist           Walk 150 feet activity   Assist           Walk 10 feet on uneven surface  activity   Assist           Wheelchair     Assist               Wheelchair 50 feet with 2 turns activity    Assist            Wheelchair 150 feet activity     Assist          Blood pressure 102/75, pulse 68, temperature 97.7 F (36.5 C), temperature source Oral, resp. rate 18, SpO2 98 %.  Medical Problem List and Plan: 1.  Impaired balance and Has with impaired function secondary to B/L SDHs s/p Bifrontal craniotomies after fall             -patient  may  Shower if staples on head covered             -ELOS/Goals: 12-14 days Supervision- pt wants to leave at day 4-7 he said- might need encouragement  -Continue CIR 2.  Antithrombotics: -DVT/anticoagulation:  Pharmaceutical: Continue Heparin             -antiplatelet therapy: N/A 3. Pain Management:  Vicodin prn. Has used twice 3/11- woke in night to use, continue 4. Mood: LCSW to follow for evaluation and support.             -antipsychotic agents: N/A 5. Neuropsych: This patient is not fully capable of making decisions on his own behalf. 6. Skin/Wound Care: Monitor incisions for healing.  7. Fluids/Electrolytes/Nutrition: Monitor I/O. Check lytsi 8. AFib/flutter: Will monitor HR tid--continue amiodarone now daily with digoxin.              --Monitor for symptoms with increase in activity.              --low dose BB due to soft BP. 9. H/o BPH/Urinary retention: Foley d/c today--continue Flomax but change to evenings to avoid orthostatic symptoms              --will monitor voiding with PVR checks.             --check UA/UCS to rule out infection affecting mentation.  10. Seizure prophylaxis: Continue Keppra bid.  11. 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  12. Post Crani Headaches- currently ok with just taking pain meds prn, however if still here by Monday 2-3x/day, suggest Topamax for prevention 13. Constipation- Last BM > 1 week ago per pt- will give Sorbitol prn 14. Hyponatremia: Na 134- monitor weekly.     LOS: 1 days A FACE TO  FACE EVALUATION WAS PERFORMED  Mike Thomas 10/04/2020, 11:18 AM
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
## 2020-10-04 NOTE — Progress Notes (Signed)
Occupational Therapy Session Note  Patient Details  Name: Mike Thomas MRN: [id number] Date of Birth: 09-Jan-1951  Today's Date: 10/04/2020 OT Individual Time: 1500-1530 OT Individual Time Calculation (min): 30 min    Short Term Goals: Week 1:  OT Short Term Goal 1 (Week 1): pt will transfer to toilet wiht CGA OT Short Term Goal 2 (Week 1): Pt will sit to stand iwht CGA to advance pants past hips OT Short Term Goal 3 (Week 1): Pt will groom in standing wiht CGA to dmeo improved standign tolerance OT Short Term Goal 4 (Week 1): Pt will don shirt wiht no VC to demo improved praxis  Skilled Therapeutic Interventions/Progress Updates:    1:1. Pt received in bed agreeable to OT with family present. Pt completes stand pivot transfers closed chian with bed rail/arm rest with MIN-mod A to power up and MOD A to step and turn. Pt does better with stepping R foot to target I.e. "step your foot to my foot." to encourage pivot steps. Pt stands over 3 trials at [facility] for sequencing demoing difficulty locating #/letters in periperal and requiring 4.5 second reaction time with LUE and 5.0 reaction time with RUE. Pt completes memory sequence recalling words up to 4 in order at bits seated. Last standing trial included reaching to stimulus with changing central fixation which pt was able to accurately indicate change of central fixation 4/7 times. Exited session with pt seated in bed, exit alarm on and call light in reach   Therapy Documentation Precautions:  Precautions Precautions: Fall Precaution Comments: monitor HR Restrictions Weight Bearing Restrictions: No General:   Vital Signs: Therapy Vitals Temp: 98.4 F (36.9 C) Temp Source: Oral Pulse Rate: 62 Resp: 17 BP: 115/82 Patient Position (if appropriate): Lying Oxygen Therapy SpO2: 100 % O2 Device: Room Air Pain:   ADL: ADL Upper Body Dressing: Moderate cueing Where Assessed-Upper Body Dressing: Edge of bed Toileting: Moderate  cueing,Moderate assistance Toilet Transfer: Moderate assistance Toilet Transfer Method: Counselling psychologist: Radiographer, therapeutic Method: Ambulating Vision Baseline Vision/History: Wears glasses Wears Glasses: Reading only Perception  Perception: Impaired Inattention/Neglect: Does not attend to left visual field;Does not attend to left side of body Praxis Praxis: Impaired Praxis Impairment Details: Motor planning;Ideomotor;Initiation Exercises:   Other Treatments:     Therapy/Group: Individual Therapy  Tonny Branch 10/04/2020, 3:47 PM

## 2020-10-04 NOTE — Progress Notes (Signed)
[facility] Individual Statement of Services  Patient Name:  Mike Thomas  Date:  10/04/2020  Welcome to the [facility].  Our goal is to provide you with an individualized program based on your diagnosis and situation, designed to meet your specific needs.  With this comprehensive rehabilitation program, you will be expected to participate in at least 3 hours of rehabilitation therapies Monday-Friday, with modified therapy programming on the weekends.  Your rehabilitation program will include the following services:  Physical Therapy (PT), Occupational Therapy (OT), Speech Therapy (ST), 24 hour per day rehabilitation nursing, Therapeutic Recreaction (TR), Neuropsychology, Care Coordinator, Rehabilitation Medicine, Nutrition Services, Pharmacy Services and Other  Weekly team conferences will be held on Tuesdays to discuss your progress.  Your Inpatient Rehabilitation Care Coordinator will talk with you frequently to get your input and to update you on team discussions.  Team conferences with you and your family in attendance may also be held.  Expected length of stay: 12-14 Days  Overall anticipated outcome: Supervision  Depending on your progress and recovery, your program may change. Your Inpatient Rehabilitation Care Coordinator will coordinate services and will keep you informed of any changes. Your Inpatient Rehabilitation Care Coordinator's name and contact numbers are listed  below.  The following services may also be recommended but are not provided by the [facility]:    [place] will be made to provide these services after discharge if needed.  Arrangements include referral to agencies that provide these services.  Your insurance has been verified to be:  [employer] Your primary doctor is:  Dione Housekeeper, MD  Pertinent information  will be shared with your doctor and your insurance company.  Inpatient Rehabilitation Care Coordinator:  Cathleen Corti [phone] or ([phone]  Information discussed with and copy given to patient by: Dyanne Iha, 10/04/2020, 11:46 AM

## 2020-10-04 NOTE — Progress Notes (Signed)
Inpatient Rehabilitation  Patient information reviewed and entered into eRehab system by Lenoard Helbert M. Catheline Hixon, M.A., CCC/SLP, PPS Coordinator.  Information including medical coding, functional ability and quality indicators will be reviewed and updated through discharge.    

## 2020-10-05 DIAGNOSIS — R339 Retention of urine, unspecified: Secondary | ICD-10-CM

## 2020-10-05 DIAGNOSIS — K5903 Drug induced constipation: Secondary | ICD-10-CM

## 2020-10-05 DIAGNOSIS — E871 Hypo-osmolality and hyponatremia: Secondary | ICD-10-CM

## 2020-10-05 DIAGNOSIS — G441 Vascular headache, not elsewhere classified: Secondary | ICD-10-CM

## 2020-10-05 DIAGNOSIS — R8279 Other abnormal findings on microbiological examination of urine: Secondary | ICD-10-CM

## 2020-10-05 MED ORDER — OXYCODONE HCL 5 MG PO TABS
5.0000 mg | ORAL_TABLET | ORAL | Status: DC | PRN
  Administered 2020-10-05 – 2020-10-06 (×5): 5 mg via ORAL
  Filled 2020-10-05 (×5): qty 1

## 2020-10-05 NOTE — Progress Notes (Signed)
PROGRESS NOTE   Subjective/Complaints: Patient seen laying in bed this morning.  He states he slept fairly overnight due to being constipated, but then states he had a good bowel movement.  He has questions regarding paperwork for his lawyer.  ROS: Denies CP, SOB, N/V/D  Objective:   No results found. Recent Labs    10/04/20 0512  WBC 7.9  HGB 16.6  HCT 46.9  PLT 162   Recent Labs    10/04/20 0512  NA 134*  K 3.9  CL 102  CO2 25  GLUCOSE 115*  BUN 19  CREATININE 0.98  CALCIUM 8.6*    Intake/Output Summary (Last 24 hours) at 10/05/2020 1037 Last data filed at 10/05/2020 0742 Gross per 24 hour  Intake 520 ml  Output 600 ml  Net -80 ml        Physical Exam: Vital Signs Blood pressure 121/86, pulse 75, temperature 98 F (36.7 C), resp. rate 19, SpO2 100 %. Constitutional: No distress . Vital signs reviewed. HENT: Normocephalic.  Craniotomy sites. Eyes: EOMI. No discharge. Cardiovascular: No JVD.  RRR. Respiratory: Normal effort.  No stridor.  Bilateral clear to auscultation. GI: Non-distended.  BS +. Skin: Warm and dry.  Craniotomy site CDI. Psych: Normal mood.  Normal behavior. Musc: No edema in extremities.  No tenderness in extremities. Neuro: Alert Motor: Grossly 5/5 throughout  Assessment/Plan: 1. Functional deficits which require 3+ hours per day of interdisciplinary therapy in a comprehensive inpatient rehab setting.  Physiatrist is providing close team supervision and 24 hour management of active medical problems listed below.  Physiatrist and rehab team continue to assess barriers to discharge/monitor patient progress toward functional and medical goals  Care Tool:  Bathing  Bathing activity did not occur: Refused           Bathing assist       Upper Body Dressing/Undressing Upper body dressing   What is the patient wearing?: Pull over shirt    Upper body assist Assist Level:  Supervision/Verbal cueing    Lower Body Dressing/Undressing Lower body dressing      What is the patient wearing?: Pants     Lower body assist Assist for lower body dressing: Moderate Assistance - Patient 50 - 74%     Toileting Toileting    Toileting assist Assist for toileting: Maximal Assistance - Patient 25 - 49%     Transfers Chair/bed transfer  Transfers assist     Chair/bed transfer assist level: Moderate Assistance - Patient 50 - 74%     Locomotion Ambulation   Ambulation assist      Assist level: 2 helpers Assistive device: Hand held assist Max distance: 50   Walk 10 feet activity   Assist     Assist level: Moderate Assistance - Patient - 50 - 74% Assistive device: Hand held assist   Walk 50 feet activity   Assist    Assist level: 2 helpers Assistive device: Hand held assist    Walk 150 feet activity   Assist Walk 150 feet activity did not occur: Safety/medical concerns         Walk 10 feet on uneven surface  activity   Assist  Assist level: Moderate Assistance - Patient - 50 - 74% Assistive device: Hand held assist,Other (comment) (and use of single rail)   Wheelchair     Assist Will patient use wheelchair at discharge?: Yes (TBD) Type of Wheelchair: Manual    Wheelchair assist level: Minimal Assistance - Patient > 75% Max wheelchair distance: 50    Wheelchair 50 feet with 2 turns activity    Assist        Assist Level: Minimal Assistance - Patient > 75%   Wheelchair 150 feet activity     Assist  Wheelchair 150 feet activity did not occur: Safety/medical concerns       Blood pressure 121/86, pulse 75, temperature 98 F (36.7 C), resp. rate 19, SpO2 100 %.  Medical Problem List and Plan: 1.  Impaired balance and Has with impaired function secondary to B/L SDHs s/p Bifrontal craniotomies after fall             Continue CIR 2.  Antithrombotics: -DVT/anticoagulation:  Pharmaceutical:  Continue Heparin             -antiplatelet therapy: N/A 3. Pain Management:    Vicodin changed to oxycodone due to concern for excessive Tylenol consumption   Tylenol as needed  Appears controlled on 3/12 4. Mood: LCSW to follow for evaluation and support.             -antipsychotic agents: N/A 5. Neuropsych: This patient is not fully capable of making decisions on his own behalf. 6. Skin/Wound Care: Monitor incisions for healing.  7. Fluids/Electrolytes/Nutrition: Monitor I/Os. 8. AFib/flutter: Will monitor HR tid--continue amiodarone now daily with digoxin.              --Monitor for symptoms with increase in activity.              --low dose BB due to soft BP. 9. H/o BPH/Urinary retention: Foley d/ced  Flomax but change to evenings to avoid orthostatic symptoms              Improving            .  10. Seizure prophylaxis: Continue Keppra bid.  11. L calf tightness/spasms-we will consider medications if necessary 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  Drug-induced constipation- Last BM > 1 week ago per pt- Sorbitol prn  Improving 14. Hyponatremia:   Sodium 134 on 3/11  Continue to monitor 15.  Abnormal urine culture  UA unremarkable, urine culture with 80,000 gram-negative rods, awaiting susceptibilities  LOS: 2 days A FACE TO FACE EVALUATION WAS PERFORMED  Ankit Lorie Phenix 10/05/2020, 10:37 AM
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
## 2020-10-06 LAB — URINE CULTURE: Culture: 80000 — AB

## 2020-10-06 MED ORDER — OXYCODONE HCL 5 MG PO TABS
5.0000 mg | ORAL_TABLET | ORAL | Status: DC | PRN
Start: 2020-10-06 — End: 2020-10-22
  Administered 2020-10-06 – 2020-10-07 (×3): 10 mg via ORAL
  Administered 2020-10-07: 5 mg via ORAL
  Administered 2020-10-07 – 2020-10-14 (×18): 10 mg via ORAL
  Administered 2020-10-14: 5 mg via ORAL
  Administered 2020-10-15 – 2020-10-18 (×6): 10 mg via ORAL
  Administered 2020-10-20 – 2020-10-22 (×4): 5 mg via ORAL
  Filled 2020-10-06: qty 1
  Filled 2020-10-06 (×3): qty 2
  Filled 2020-10-06: qty 1
  Filled 2020-10-06 (×2): qty 2
  Filled 2020-10-06: qty 1
  Filled 2020-10-06 (×2): qty 2
  Filled 2020-10-06: qty 1
  Filled 2020-10-06 (×9): qty 2
  Filled 2020-10-06: qty 1
  Filled 2020-10-06 (×7): qty 2
  Filled 2020-10-06 (×3): qty 1
  Filled 2020-10-06: qty 2
  Filled 2020-10-06: qty 1
  Filled 2020-10-06: qty 2
  Filled 2020-10-06: qty 1

## 2020-10-06 NOTE — Progress Notes (Signed)
Occupational Therapy Session Note  Patient Details  Name: Mike Thomas MRN: [id number] Date of Birth: January 13, 1951  Today's Date: 10/06/2020 OT Individual Time: 1051-1200 OT Individual Time Calculation (min): 69 min    Short Term Goals: Week 1:  OT Short Term Goal 1 (Week 1): pt will transfer to toilet wiht CGA OT Short Term Goal 2 (Week 1): Pt will sit to stand iwht CGA to advance pants past hips OT Short Term Goal 3 (Week 1): Pt will groom in standing wiht CGA to dmeo improved standign tolerance OT Short Term Goal 4 (Week 1): Pt will don shirt wiht no VC to demo improved praxis  Skilled Therapeutic Interventions/Progress Updates:    Pt received sitting in recliner with no c/o pain but c/o fatigue. Discussed PLOF and home set up, as well as assistance he provides to disabled GF. Pt completed sit > stand from the recliner with the RW, min cueing for hand placement. Ambulatory transfer to the w/c, ~8 ft, with RW, shuffling gait with very poor motor planning when attempting to turn and sit, requiring mod cueing and facilitation. Pt was taken via w/c to the ADL apt. Education provided re use of a TTB for home shower transfers. Pt opted to not practice but took picture on his phone for carryover. He was taken to w/c level dance group, individual 1:1 attention and cueing for pt. Focus on BUE coordination, motor planning, and strengthening. Pt with difficulty motor planning UE and LE movement together, as well as with novel dance movements. Pt did very well with increased sensory stimuli and pragmatics of being in a group setting, suggesting songs and interacting with other patients appropriately. Pt returned to his room and transferred back to recliner. Increased difficulty with motor planning turn to sit back in recliner with facilitation required for weightshift R and L and cueing for isolated R/LLE movement. Pt was left sitting up in the recliner with all needs met, chair alarm set.   Therapy  Documentation Precautions:  Precautions Precautions: Fall Precaution Comments: monitor HR Restrictions Weight Bearing Restrictions: No   Therapy/Group: Individual Therapy  Curtis Sites 10/06/2020, 8:00 AM

## 2020-10-06 NOTE — Progress Notes (Signed)
Occupational Therapy Session Note  Patient Details  Name: Mike Thomas MRN: [id number] Date of Birth: 1951/05/15  Today's Date: 10/06/2020 OT Individual Time: 1345-1430 OT Individual Time Calculation (min): 45 min    Short Term Goals: Week 1:  OT Short Term Goal 1 (Week 1): pt will transfer to toilet wiht CGA OT Short Term Goal 2 (Week 1): Pt will sit to stand iwht CGA to advance pants past hips OT Short Term Goal 3 (Week 1): Pt will groom in standing wiht CGA to dmeo improved standign tolerance OT Short Term Goal 4 (Week 1): Pt will don shirt wiht no VC to demo improved praxis  Skilled Therapeutic Interventions/Progress Updates:    Patient seated in recliner, denies pain and is cooperative, talkative t/o session.  Sit to stand from recliner t/o session from CG to min A.  Standing activities with trunk mobility / reaching activities, weight shift and conversation with family.  No LOB in stance - tolerates approx 2 minutes at a time.   Some c/o HA and dizziness with head turns and reaching.  Cues for hand placement with transitions.  Family arrived during session.  Reviewed balance, coordination and cognitive recovery, general brain injury and edema management.  Patient remained seated in recliner at close of session, seat alarm set and call bell in reach, family present.    Therapy Documentation Precautions:  Precautions Precautions: Fall Precaution Comments: monitor HR Restrictions Weight Bearing Restrictions: No   Therapy/Group: Individual Therapy  Carlos Levering 10/06/2020, 7:54 AM

## 2020-10-06 NOTE — Progress Notes (Signed)
PROGRESS NOTE   Subjective/Complaints: Patient seen sitting up in bed this morning.  He states he slept well overnight due to scalp pain.  Per nursing, patient slept better overnight.  ROS: Denies CP, SOB, N/V/D  Objective:   No results found. Recent Labs    10/04/20 0512  WBC 7.9  HGB 16.6  HCT 46.9  PLT 162   Recent Labs    10/04/20 0512  NA 134*  K 3.9  CL 102  CO2 25  GLUCOSE 115*  BUN 19  CREATININE 0.98  CALCIUM 8.6*    Intake/Output Summary (Last 24 hours) at 10/06/2020 0825 Last data filed at 10/05/2020 2000 Gross per 24 hour  Intake 240 ml  Output 500 ml  Net -260 ml        Physical Exam: Vital Signs Blood pressure 120/72, pulse 73, temperature 98.7 F (37.1 C), resp. rate 18, SpO2 98 %. Constitutional: No distress . Vital signs reviewed. HENT: Normocephalic.  Crani sites b/l Eyes: EOMI. No discharge. Cardiovascular: No JVD.  RRR. Respiratory: Normal effort.  No stridor.  Bilateral clear to auscultation. GI: Non-distended.  BS +. Skin: Warm and dry.   B/l Crani sites with staples CDI, some edema along staple lines Psych: Normal mood.  Normal behavior. Musc: No edema in extremities.  No tenderness in extremities. Neuro: Alert Motor: Grossly 5/5 throughout, unchanged  Assessment/Plan: 1. Functional deficits which require 3+ hours per day of interdisciplinary therapy in a comprehensive inpatient rehab setting.  Physiatrist is providing close team supervision and 24 hour management of active medical problems listed below.  Physiatrist and rehab team continue to assess barriers to discharge/monitor patient progress toward functional and medical goals  Care Tool:  Bathing  Bathing activity did not occur: Refused           Bathing assist       Upper Body Dressing/Undressing Upper body dressing   What is the patient wearing?: Pull over shirt    Upper body assist Assist Level:  Supervision/Verbal cueing    Lower Body Dressing/Undressing Lower body dressing      What is the patient wearing?: Pants     Lower body assist Assist for lower body dressing: Moderate Assistance - Patient 50 - 74%     Toileting Toileting    Toileting assist Assist for toileting: Maximal Assistance - Patient 25 - 49%     Transfers Chair/bed transfer  Transfers assist     Chair/bed transfer assist level: Moderate Assistance - Patient 50 - 74%     Locomotion Ambulation   Ambulation assist      Assist level: 2 helpers Assistive device: Hand held assist Max distance: 50   Walk 10 feet activity   Assist     Assist level: Moderate Assistance - Patient - 50 - 74% Assistive device: Hand held assist   Walk 50 feet activity   Assist    Assist level: 2 helpers Assistive device: Hand held assist    Walk 150 feet activity   Assist Walk 150 feet activity did not occur: Safety/medical concerns         Walk 10 feet on uneven surface  activity   Assist  Assist level: Moderate Assistance - Patient - 50 - 74% Assistive device: Hand held assist,Other (comment) (and use of single rail)   Wheelchair     Assist Will patient use wheelchair at discharge?: Yes (TBD) Type of Wheelchair: Manual    Wheelchair assist level: Minimal Assistance - Patient > 75% Max wheelchair distance: 50    Wheelchair 50 feet with 2 turns activity    Assist        Assist Level: Minimal Assistance - Patient > 75%   Wheelchair 150 feet activity     Assist  Wheelchair 150 feet activity did not occur: Safety/medical concerns       Blood pressure 120/72, pulse 73, temperature 98.7 F (37.1 C), resp. rate 18, SpO2 98 %.  Medical Problem List and Plan: 1.  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  Antithrombotics: -DVT/anticoagulation:  Pharmaceutical:  Continue Heparin             -antiplatelet therapy: N/A 3. Pain Management:    Vicodin changed to oxycodone due to concern for excessive Tylenol consumption   Tylenol as needed  Oxycodone as needed 4. Mood: LCSW to follow for evaluation and support.             -antipsychotic agents: N/A 5. Neuropsych: This patient is not fully capable of making decisions on his own behalf. 6. Skin/Wound Care: Monitor incisions for healing.  7. Fluids/Electrolytes/Nutrition: Monitor I/Os. 8. AFib/flutter: Will monitor HR tid--continue amiodarone now daily with digoxin.              --Monitor for symptoms with increase in activity.              --low dose BB due to soft BP. 9. H/o BPH/Urinary retention: Foley d/ced  Flomax but change to evenings to avoid orthostatic symptoms              improving            .  10. Seizure prophylaxis: Continue Keppra bid.  11. L calf tightness/spasms-we will consider medications if necessary 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  Drug-induced constipation  Improving 14. Hyponatremia:   Sodium 134 on 3/11, labs ordered for tomorrow  Continue to monitor 15.  Abnormal urine culture  UA unremarkable, urine culture with 80,000 gram-negative rods, susceptibilities pending  LOS: 3 days A FACE TO FACE EVALUATION WAS PERFORMED  Adriene Padula Lorie Phenix 10/06/2020, 8:25 AM
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
## 2020-10-07 LAB — CBC
HCT: 43.4 % (ref 39.0–52.0)
Hemoglobin: 15.3 g/dL (ref 13.0–17.0)
MCH: 34.2 pg — ABNORMAL HIGH (ref 26.0–34.0)
MCHC: 35.3 g/dL (ref 30.0–36.0)
MCV: 97.1 fL (ref 80.0–100.0)
Platelets: 167 10*3/uL (ref 150–400)
RBC: 4.47 MIL/uL (ref 4.22–5.81)
RDW: 13.1 % (ref 11.5–15.5)
WBC: 8.2 10*3/uL (ref 4.0–10.5)
nRBC: 0 % (ref 0.0–0.2)

## 2020-10-07 LAB — BASIC METABOLIC PANEL
Anion gap: 9 (ref 5–15)
BUN: 16 mg/dL (ref 8–23)
CO2: 28 mmol/L (ref 22–32)
Calcium: 8.8 mg/dL — ABNORMAL LOW (ref 8.9–10.3)
Chloride: 97 mmol/L — ABNORMAL LOW (ref 98–111)
Creatinine, Ser: 1.25 mg/dL — ABNORMAL HIGH (ref 0.61–1.24)
GFR, Estimated: >60 mL/min (ref >60)
Glucose, Bld: 117 mg/dL — ABNORMAL HIGH (ref 70–99)
Potassium: 4.1 mmol/L (ref 3.5–5.1)
Sodium: 134 mmol/L — ABNORMAL LOW (ref 135–145)

## 2020-10-07 NOTE — Progress Notes (Signed)
Physical Therapy Session Note  Patient Details  Name: Mike Thomas MRN: [id number] Date of Birth: December 31, 1950  Today's Date: 10/07/2020 PT Individual Time: 1105-1200 PT Individual Time Calculation (min): 55 min   Short Term Goals: Week 1:  PT Short Term Goal 1 (Week 1): pt will perform sit to stand from standard height surfaces w/cga PT Short Term Goal 2 (Week 1): bed to/from wc w/min assist and minimal cues for sequencing PT Short Term Goal 3 (Week 1): Gait 63ft w/LRAD and min assist of 1 PT Short Term Goal 4 (Week 1): pt will ascend/descend 4 stairs w/single rail w/cga  Skilled Therapeutic Interventions/Progress Updates:     Patient in bed upon PT arrival. Patient alert and agreeable to PT session. Patient reported 8-9/10 L throbbing temple pain with headache during session, RN made aware and provided tylenol. PT provided repositioning, rest breaks, and distraction as pain interventions throughout session.   Vitals: BP 111/77, HR 82  Educated patient on elevated HOB to improved headache in lying. Locked bed out to [facility] >30 deg to remind patient,  RN made aware. Discussed edema over L side of his head just lateral to his incision. Noted boggy edema to palpation, no change since yesterday. Educated patient on pain and edema expectations and signs for concern following brain injury and surgery. Patient stated understanding and appreciative of education.   Therapeutic Activity: Bed Mobility: Patient performed supine to sit with supervision with use of hospital bed functions. Transfers: Patient performed sit to/from stand x2 with CGA using RW. Provided verbal cues for hand placement and reaching back to sit for safe use of RW.  Gait Training:  Patient ambulated 54 feet using RW with CGA-min A for AD managment. Ambulated with shuffling gait with significantly small step length and step height, minimal L foot clearance, increased L external rotation at the hip leading to toe out, forward flexed  posture, downward head gaze, and decreased motor planning with cues to increase step length causing pauses in gait each time cues were given. Facilitated forward progressing of RW throughout to promote increases step length with intermittent improvement. Required significant increased time to complete gait training due to hypometria, motor planning deficits, and decreased step length.  Patient declined further mobility following first ambulation trial due to increased fatigue and continued headache. Patient requested to set-up his computer, PT place computer on hospital tray. Patient able to turn the computer on, but unable to recognize when it was ready for use. Required cues for where to place the mouse to make selections to connect to the Internet and pull-up a web browser, things he claims he could do on his own PTA. Provided patient with webpage for Brain [facility] [address] to review education on brain injury, per patient's request.    Patient required increased time for all activities due to hypometria, decreased motor planning, cognitive deficits, and verbose language throughout session.  Patient in w/c handed off to NT for bladder scan at end of session with breaks locked and all needs within reach.    Therapy Documentation Precautions:  Precautions Precautions: Fall Precaution Comments: monitor HR Restrictions Weight Bearing Restrictions: No   Therapy/Group: Individual Therapy  Ameen Mostafa L Kannen Moxey PT, DPT  10/07/2020, 4:24 PM

## 2020-10-07 NOTE — Progress Notes (Signed)
PROGRESS NOTE   Subjective/Complaints: Has had mild headaches which can wax and wane somewhat. Otherwise no new problems today. SLP in room  ROS: Patient denies fever, rash, sore throat, blurred vision, nausea, vomiting, diarrhea, cough, shortness of breath or chest pain, joint or back pain,   or mood change.    Objective:   No results found. Recent Labs    10/07/20 0448  WBC 8.2  HGB 15.3  HCT 43.4  PLT 167   Recent Labs    10/07/20 0448  NA 134*  K 4.1  CL 97*  CO2 28  GLUCOSE 117*  BUN 16  CREATININE 1.25*  CALCIUM 8.8*    Intake/Output Summary (Last 24 hours) at 10/07/2020 1300 Last data filed at 10/07/2020 1216 Gross per 24 hour  Intake 477 ml  Output 800 ml  Net -323 ml        Physical Exam: Vital Signs Blood pressure 122/78, pulse 62, temperature 98 F (36.7 C), resp. rate 18, SpO2 100 %. Constitutional: No distress . Vital signs reviewed. HEENT: EOMI, oral membranes moist Neck: supple Cardiovascular: RRR without murmur. No JVD    Respiratory/Chest: CTA Bilaterally without wheezes or rales. Normal effort    GI/Abdomen: BS +, non-tender, non-distended Ext: no clubbing, cyanosis, or edema Psych: pleasant and cooperative Skin: Warm and dry.   Crani sites CDI Musc: No edema in extremities.  No tenderness in extremities. Neuro: Alert. Can be tangential. No focan CN findings.  Motor: Grossly 5/5 throughout, unchanged  Assessment/Plan: 1. Functional deficits which require 3+ hours per day of interdisciplinary therapy in a comprehensive inpatient rehab setting.  Physiatrist is providing close team supervision and 24 hour management of active medical problems listed below.  Physiatrist and rehab team continue to assess barriers to discharge/monitor patient progress toward functional and medical goals  Care Tool:  Bathing  Bathing activity did not occur: Refused           Bathing assist        Upper Body Dressing/Undressing Upper body dressing   What is the patient wearing?: Pull over shirt    Upper body assist Assist Level: Supervision/Verbal cueing    Lower Body Dressing/Undressing Lower body dressing      What is the patient wearing?: Pants     Lower body assist Assist for lower body dressing: Moderate Assistance - Patient 50 - 74%     Toileting Toileting    Toileting assist Assist for toileting: Maximal Assistance - Patient 25 - 49%     Transfers Chair/bed transfer  Transfers assist     Chair/bed transfer assist level: Moderate Assistance - Patient 50 - 74%     Locomotion Ambulation   Ambulation assist      Assist level: 2 helpers Assistive device: Hand held assist Max distance: 50   Walk 10 feet activity   Assist     Assist level: Moderate Assistance - Patient - 50 - 74% Assistive device: Hand held assist   Walk 50 feet activity   Assist    Assist level: 2 helpers Assistive device: Hand held assist    Walk 150 feet activity   Assist Walk 150 feet activity did  not occur: Safety/medical concerns         Walk 10 feet on uneven surface  activity   Assist     Assist level: Moderate Assistance - Patient - 50 - 74% Assistive device: Hand held assist,Other (comment) (and use of single rail)   Wheelchair     Assist Will patient use wheelchair at discharge?: Yes (TBD) Type of Wheelchair: Manual    Wheelchair assist level: Minimal Assistance - Patient > 75% Max wheelchair distance: 50    Wheelchair 50 feet with 2 turns activity    Assist        Assist Level: Minimal Assistance - Patient > 75%   Wheelchair 150 feet activity     Assist  Wheelchair 150 feet activity did not occur: Safety/medical concerns       Blood pressure 122/78, pulse 62, temperature 98 F (36.7 C), resp. rate 18, SpO2 100 %.  Medical Problem List and Plan: 1.  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  Antithrombotics: -DVT/anticoagulation:  Pharmaceutical: Continue Heparin             -antiplatelet therapy: N/A 3. Pain Management:    Vicodin changed to oxycodone due to concern for excessive Tylenol consumption   Tylenol as needed  Oxycodone as needed  -discussed use of topamax with patient. We will observe sx today 4. Mood: LCSW to follow for evaluation and support.             -antipsychotic agents: N/A 5. Neuropsych: This patient is not fully capable of making decisions on his own behalf. 6. Skin/Wound Care: Monitor incisions for healing.  7. Fluids/Electrolytes/Nutrition: Monitor I/Os. 8. AFib/flutter: Will monitor HR tid--continue amiodarone now daily with digoxin.              --toprol, HR controlled 9. H/o BPH/Urinary retention: Foley d/ced  Flomax but change to evenings to avoid orthostatic symptoms              improving emptying, urine culture below          .  10. Seizure prophylaxis: Continue Keppra bid.  11. L calf tightness/spasms-we will consider medications if necessary 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  Drug-induced constipation  Improving 14. Hyponatremia:   Sodium 134 on 3/11, labs ordered for tomorrow  Continue to monitor 15.  Abnormal urine culture   urine culture with 80,000 E coli   -pt really asymptomatic --no rx  LOS: 4 days A FACE TO FACE EVALUATION WAS PERFORMED  Meredith Staggers 10/07/2020, 1:00 PM

## 2020-10-07 NOTE — Progress Notes (Signed)
Occupational Therapy Session Note  Patient Details  Name: Mike Thomas MRN: [id number] Date of Birth: [birth year]  Today's Date: 10/07/2020 OT Individual Time: 2010-0712 OT Individual Time Calculation (min): 55 min    Short Term Goals: Week 1:  OT Short Term Goal 1 (Week 1): pt will transfer to toilet wiht CGA OT Short Term Goal 2 (Week 1): Pt will sit to stand iwht CGA to advance pants past hips OT Short Term Goal 3 (Week 1): Pt will groom in standing wiht CGA to dmeo improved standign tolerance OT Short Term Goal 4 (Week 1): Pt will don shirt wiht no VC to demo improved praxis  Skilled Therapeutic Interventions/Progress Updates:    Pt received supine, slightly lethargic but agreeable to OT session. Pt initially declining ADLs but encouraged at least changing clothes d/t concerns of hygiene and pt was agreeable. Pt transferred to EOB with mod cueing for initiation. Poor sustained attention to task throughout session, as pt was unable to dual task between dressing/transfers and holding conversation. He completed UB dressing with poor awareness of errors and poor sequencing/motor planning, mod A overall. Pt completed LB dressing with min A. Pt completed sit > stand from EOB with min A. He completed ambulatory transfer to the sink with CGA, cueing required for sequencing RW management and motor planning. Min cueing for thoroughness of oral care. Pt completed 15 ft of functional mobility to focus on L LE management, RW positioning, and motor planning- mod cueing overall required. Again, very slow and labored turn to sit in bed with max cueing. Pt was left supine with all needs met, bed alarm set.   Therapy Documentation Precautions:  Precautions Precautions: Fall Precaution Comments: monitor HR Restrictions Weight Bearing Restrictions: No   Therapy/Group: Individual Therapy  Curtis Sites 10/07/2020, 6:29 AM

## 2020-10-07 NOTE — Progress Notes (Signed)
Speech Language Pathology Daily Session Note  Patient Details  Name: Mike Thomas MRN: [id number] Date of Birth: 01-04-1951  Today's Date: 10/07/2020 SLP Individual Time: 1638-4665 SLP Individual Time Calculation (min): 55 min  Short Term Goals: Week 1: SLP Short Term Goal 1 (Week 1): Patient will demonstrate functional problem solving for functional and mildly complex tasks with Min A verbal cues. SLP Short Term Goal 2 (Week 1): Patient will demonstrate recall new, daily information with Mod verbal and visual cues. SLP Short Term Goal 3 (Week 1): Patient will demonstrate selective attention in a mildly distracting enviornment for 30 minutes with Min A verbal cue for redirection. SLP Short Term Goal 4 (Week 1): Patient will identify 2 physical and 2 cognitive deficits with Min verbal cues.  Skilled Therapeutic Interventions: Skilled treatment session focused on cognitive goals. Upon arrival, patient appeared lethargic. Patient reported he was unable to sleep due to pain, physician aware. Patient requested to use the SLP's cell phone due to his being disables. SLP provided patient with a room phone with Mod verbal cues needed for problem solving while attempting to turn on the phone and dial the phone number. Patient participated in a basic money management task with extra time and Mod A verbal cues needed for organization. Patient could verbally problem solve the mathematical questions related to money but required Mod verbal cues to count money and generate correct amount via dollar bills and coins. Patient demonstrated appropriate emergent awareness regarding difficulty of task. Patient left upright in bed with alarm on and all needs within reach. Continue with current plan of care.      Pain No/Denies Pain  Therapy/Group: Individual Therapy  Zakyria Metzinger 10/07/2020, 12:38 PM

## 2020-10-08 ENCOUNTER — Other Ambulatory Visit: Payer: Self-pay

## 2020-10-08 NOTE — Progress Notes (Signed)
Physical Therapy Session Note  Patient Details  Name: Mike Thomas MRN: [id number] Date of Birth: [birth year]  Today's Date: 10/08/2020 PT Individual Time: 1502-1530 PT Individual Time Calculation (min): 28 min   Short Term Goals: Week 1:  PT Short Term Goal 1 (Week 1): pt will perform sit to stand from standard height surfaces w/cga PT Short Term Goal 2 (Week 1): bed to/from wc w/min assist and minimal cues for sequencing PT Short Term Goal 3 (Week 1): Gait 4ft w/LRAD and min assist of 1 PT Short Term Goal 4 (Week 1): pt will ascend/descend 4 stairs w/single rail w/cga  Skilled Therapeutic Interventions/Progress Updates:  Patient supine in bed upon PT arrival with sister and nephew present. Patient alert and initially attempting to postpone session, but family providing supportive communication and encouragement to participate. Pt then agreeable to short PT session. Patient denied pain during session.  Therapeutic Activity: Bed Mobility: Pt performs supine-->sit with CGA requiring extra time to initiate and complete with vc for technique/ sequence. At end of session, sit--> supine performed with supervision initially and light CGA for final LE positioning with pt preferring figure 4 positioning for improved comfort in low back. CGA for shoulder positioning into neutral body position. With vc pt is able to assist.   Transfers: Pt requires elevated bed surface and extra time to initiate STS to RW from EOB. Completes with CGA and then requires Min A to attain and maintain standing balance at RW.  Provided verbal cues for forward lean into BUE support on RW. On return to EOB, ambulatory pivot stepping proved difficult for pt, potentially d/t fatigue with minimal foot movement in steps with vc alone. Mod A req'd with vc for weight shifting to unweight LE for improved backward step toward bed. Requires many attempts to improve proximity to bed. Attempts to initiate lateral stepping with no movement  from pt and so requires Mod to Max guidance of positioning during stand-->sit EOB to avoid bedrail. STS w/c <> RW during amb bout requires Min A for power up and fully controlled descent to sit. Pt able to follow vc for safe hand positioning.   Gait Training:  Patient ambulated 37' x1/ 30' x1 including 180 deg turn using RW with CGA and progressing to Min A with fatigue for AD management and balance. Quality of ambulation demonstrated withshuffling gait pattern resembling Parkinsonian gait with minimal step length/ height. With increase in fatigue, pt's LLE increases in ER and BOS widens with LLE ending up outside of walker BOS. Pt unable to correct with vc alone and requires Min A for repositioning of RW. VC also provided throughout gait bouts for increased step length/ height and continuous reciprocation, however pt is unable to improve quality of stepping and demos intermittent freezing of gait. Required increased time to complete gait training d/t hypometria, motor planning deficits, and decreased step height.  Nephew and sister with questions and concerns re: need to relearn "how to walk". Education provided re: areas of brain affected by SDH and subsequent craniotomies. Education re: need for physical assist d/t pt's deficits in initiating movements and producing smooth, controlled, larger movements as motor planning and motor control areas of brain affected. With healing and reduction in swelling at brain tissue, more coordinated movement is expected to return, but targeted therapy and repeated movements will be required.   Patient supine in bed at end of session with brakes locked, bed alarm set, and all needs within reach. Pt states feeling increased pain in "cranium".  Nsg notified who states that pt can receive pain medication and will tend to pt.    Therapy Documentation Precautions:  Precautions Precautions: Fall Precaution Comments: monitor HR Restrictions Weight Bearing Restrictions:  No  Therapy/Group: Individual Therapy  Alger Simons PT. DPT 10/08/2020, 7:02 PM

## 2020-10-08 NOTE — Progress Notes (Signed)
Pt did not sleep well last night. Pt stayed up until 0100am and complained of not sleeping well. RN gave trazodone last night along with night meds. Pt has not had BM in 3 days RN gave sorbitol.Pt stable at this time. RN will continue to monitor.

## 2020-10-08 NOTE — Progress Notes (Signed)
Speech Language Pathology Daily Session Note  Patient Details  Name: Mike Thomas MRN: [id number] Date of Birth: 1951-04-06  Today's Date: 10/08/2020 SLP Individual Time: 1350-1430 SLP Individual Time Calculation (min): 40 min  Short Term Goals: Week 1: SLP Short Term Goal 1 (Week 1): Patient will demonstrate functional problem solving for functional and mildly complex tasks with Min A verbal cues. SLP Short Term Goal 2 (Week 1): Patient will demonstrate recall new, daily information with Mod verbal and visual cues. SLP Short Term Goal 3 (Week 1): Patient will demonstrate selective attention in a mildly distracting enviornment for 30 minutes with Min A verbal cue for redirection. SLP Short Term Goal 4 (Week 1): Patient will identify 2 physical and 2 cognitive deficits with Min verbal cues.  Skilled Therapeutic Interventions: Skilled treatment session focused on family education and cognitive goals. Patient's sister and nephew present and educated on patient's current cognitive and physical functioning and goals of skilled intervention. Both verbalized understanding and asked appropriate questions. Patient unable to locate cell phone, however, after ~10 minutes of looking, patient located the phone in his pocket. Patient also perseverative on setting off the alarm in order to show his family all of the "restraints" that are in place. SLP educated on importance of safety measures to reduce fall risk. He verbalized understanding. Patient left upright in bed with alarm on and all needs within reach. Continue with current plan of care.      Pain No/Denies Pain   Therapy/Group: Individual Therapy  Eiley Mcginnity 10/08/2020, 3:11 PM

## 2020-10-08 NOTE — Progress Notes (Signed)
Physical Therapy Session Note  Patient Details  Name: Mike Thomas MRN: [id number] Date of Birth: 1951-03-26  Today's Date: 10/08/2020 PT Individual Time: 0093-8182 PT Individual Time Calculation (min): 45 min   Short Term Goals: Week 1:  PT Short Term Goal 1 (Week 1): pt will perform sit to stand from standard height surfaces w/cga PT Short Term Goal 2 (Week 1): bed to/from wc w/min assist and minimal cues for sequencing PT Short Term Goal 3 (Week 1): Gait 26ft w/LRAD and min assist of 1 PT Short Term Goal 4 (Week 1): pt will ascend/descend 4 stairs w/single rail w/cga  Skilled Therapeutic Interventions/Progress Updates:     Patient in bed upon PT arrival. Patient alert and agreeable to PT session. Patient denied pain during session.  Patient continues to present with verbose language, decreased attention, poor motor planning, and hypometria with some improvement with mobility this session.   Therapeutic Activity: Bed Mobility: Patient performed supine to/from sit with min A to sitting and supervision to lying in a flat bed without use of bed rail. Provided verbal cues for rolling to [name] to set bottom elbow to push up to sitting. Transfers: Patient performed sit to/from stand x3 with min A x1 and CGA x1 using RW and stand pivot transfer w/c>bed with min A without AD. Provided verbal cues for scooting forward, hand placement on RW, forward weight shift, and reaching back to sit. Provided heavy cues for completing pivot fully, with B lower extremities touching the bed, before sitting, as patient with poor body awareness in space and decreased motor planning with this task.  Gait Training:  Patient ambulated 88 feet using RW with CGA and w/c follow due to decreased activity tolerance. Ambulated with B hip ER L>R, shuffling gait with intermittent freezing vs pausing due to poor motor planning, increased trunk flexion, and downward head gaze. Provided verbal cues for looking ahead, erect  posture, heel strike at initial contact for increased step height and length and reduced hip ER with some improvement, cues for sequencing to reduce pausing, and cues for pushing RW further ahead to allow room for increased step length. Patient with improved gait speed and step length during gait training this session.  Patient ascended/descended 4 steps using B rails with CGA-min A for balance. Performed step-to gait pattern leading with R while ascending and L while descending. Provided cues for technique, sequencing, safe foot placement on steps, and progressing upper extremities forward while descending.   Neuromuscular Re-ed: Patient performed the following activities: -ambulated 50 feet focused on increased step length using floor tiles as visual target for reciprocal stepping and music for improved tempo of stepping with mild improvement -propelled w/c using B upper extremities for improved coordination, provided cues for "big" strokes to improve hypometric movements and equal strokes due to L upper extremity weakness, required intermittent min A for steering due to veering L  Patient in bed due to increased fatigue at end of session with breaks locked, bed alarm set, and all needs within reach.    Therapy Documentation Precautions:  Precautions Precautions: Fall Precaution Comments: monitor HR Restrictions Weight Bearing Restrictions: No   Therapy/Group: Individual Therapy  Mike Thomas L Mike Thomas PT, DPT  10/08/2020, 8:23 PM
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
## 2020-10-08 NOTE — Progress Notes (Signed)
Occupational Therapy Session Note  Patient Details  Name: Mike Thomas MRN: [id number] Date of Birth: 07-17-1951  Today's Date: 10/08/2020 OT Individual Time: 4492-5241 OT Individual Time Calculation (min): 75 min    Short Term Goals: Week 1:  OT Short Term Goal 1 (Week 1): pt will transfer to toilet wiht CGA OT Short Term Goal 2 (Week 1): Pt will sit to stand iwht CGA to advance pants past hips OT Short Term Goal 3 (Week 1): Pt will groom in standing wiht CGA to dmeo improved standign tolerance OT Short Term Goal 4 (Week 1): Pt will don shirt wiht no VC to demo improved praxis  Skilled Therapeutic Interventions/Progress Updates:    Pt received sitting in w/c with no c/o pain, reporting fatigue from bad night of sleep. Pt agreeable to shower. Pt completed sit > stand from the w/c with min A. Completed 22f functional mobility using RW needing Mod A for facilitation at hips for weight shift and VCs for foot placement, but min A overall. Poor proprioception in relation to walker positioning and when sitting. Most difficulty noted when turning RW and divided attention to conversation and walking. Pt donned shirt with poor attention to task, requiring cueing for initiation and sustained attention, but able to do so with supervision overall. Pants donned with mod A, cueing required for attention to task, motor planning, and orientation of pants. Very poor/slow motor planning to turn to sit in bed following stand pivot transfer. Little to no body awareness in re to positioning before sitting. Pt was left supine with all needs met, bed alarm set.  Increased time throughout session was required to allow pt to problem solve, maintain attention to task and motor planning.   Following shower: BP: 111/80, 73 bpm.   Therapy Documentation Precautions:  Precautions Precautions: Fall Precaution Comments: monitor HR Restrictions Weight Bearing Restrictions: No   Therapy/Group: Individual  Therapy  SCurtis Sites3/15/2022, 6:18 AM

## 2020-10-08 NOTE — Plan of Care (Signed)
Behavioral Plan: Mike Thomas   [place] Level: VII  Behavior to decrease/ eliminate:  Decrease risk of falls  Changes to environment:  -Standard safety precautions, bed alarm, chair belt alarm,  -Optimize sleep/wake cycle -Lights on during the day, off at night  Interventions: -Safety belt when in chair -Bed alarm on  -Encourage OOB during the day   Recommendations for interactions with patient: -No special considerations needed   Attendees:  Laverle Hobby, OT
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
## 2020-10-08 NOTE — Progress Notes (Signed)
[name], male   DOB: [birth year], 70 y.o.   MRN: [id number]  SW met with pt in room to provide updates from team conference, and d/c date 3/29. SW called pt s/o [name] [phone]) while in room. She reports that pt will d/c to his sister [name]'s home, and to follow-up with her. She also requested a letter for pt upcoming court case. SW informed will speak with physician.SW called pt sister Pt [phone]) to provide updates from team conference. While discussing d/c plan,and amount of support that can be provided. Pt son [name] got on the phone and states they will be in later on today to discuss further.  *SW met with pt, pt sister [name], nephew [name], and SLP-Courtney was in room. When discussing pt care needs, pt sister is unable to provide physical support to pt. States she lives around the corner from pt, and there is a cousin that lives close by as well that can only provide supervision level of care. States his d/c plan will depend on how much physical assistance pt will require. States that he will be returning back to [place] tomorrow, and patient's brother will be coming in for a few days.   SW faxed letter to [facility] [phone].  [name], MSW, [place] Office: [phone] Cell: [phone] Fax: [phone]

## 2020-10-09 NOTE — Progress Notes (Signed)
Physical Therapy Session Note  Patient Details  Name: Mike Thomas MRN: [id number] Date of Birth: 10-12-1950  Today's Date: 10/09/2020 PT Individual Time: 0900-1000 PT Individual Time Calculation (min): 60 min   Short Term Goals: Week 1:  PT Short Term Goal 1 (Week 1): pt will perform sit to stand from standard height surfaces w/cga PT Short Term Goal 2 (Week 1): bed to/from wc w/min assist and minimal cues for sequencing PT Short Term Goal 3 (Week 1): Gait 73ft w/LRAD and min assist of 1 PT Short Term Goal 4 (Week 1): pt will ascend/descend 4 stairs w/single rail w/cga Week 2:    Week 3:     Skilled Therapeutic Interventions/Progress Updates:    Pain:  Pt reports 8/10 head and upper thoracic area pain.  Treatment to tolerance. Nursing states meds not due until 11:00.  Rest breaks and repositioning as needed.  Pt initially supine and agreeable to treatment session w/focus on L attention, gait endurance, balance, transfers. Pt supine to sit w/cues for sequencing and additional time due to motor planning deficits.  Scoots to edge w/cues, time. Sit to stand w/cga to RW. Gait 156ft w/RW, takes 3-5 steps and pauses requiring cues to continue forward progress.  Cues to attend to LLE, to increase step length L.  Functional gait: Gait x 81ft locating cards hung on wall to L, retrieving w/L hand, passing to therapist on R.  Pt has difficulty fully approaching cards/motor planning then reaches forward w/min to cga to retrieve.  Shuffling and pausing of gait increases w/dual task.  Attempts at backing and turning to wc required max assist for wt shifting and advancing limbs to back due to increased motor planning challenge for pt.   Standing w/RW pt worked on single LE stepping forwards/backwards to targets at 18in spread to promote increased step length w/gai/retrogait.  Pt required verbal and visual cues, increased difficulty w/L vs R but improved w/repetition and concluded w/backing 3 steps each  LE to back to wc w/verbal cues and cga.    Gait 27ft w/RW hall to bed w/improved step length L, significantly improved ability to clear + heel strike, but increased shuffling on R following above activity. Improved ability to back to bed.  Sit to supine w/supervision. Pt left supine w/rails up x 4, alarm set, bed in lowest position, and needs in reach.   Therapy Documentation Precautions:  Precautions Precautions: Fall Precaution Comments: monitor HR Restrictions Weight Bearing Restrictions: No    Therapy/Group: Individual Therapy  Callie Fielding, [place] 10/09/2020, 1:01 PM
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
## 2020-10-09 NOTE — Progress Notes (Signed)
Staples removed from incision sites to head, pt tolerated well

## 2020-10-09 NOTE — Progress Notes (Addendum)
[name], male   DOB: April 20, 1951, 70 y.o.   MRN: [id number]  SW spoke with pt s/o Mike Thomas to provide update on letter faxed to attorney.   SW called pt sister Mike Thomas but no answer.  *SW received return phone call from pt nephew Mike Thomas. SW informed on above about letter. Discussed options for pt if he has not physically improved by his d/c date. SW informed can try SNF but it is highly unlikely due to pt being admitted to inpatient rehab; or [facility] with therapies and aid, applying for aid with PCS services ( takes 30-45 days or longer to establish care), and private pay for sitter. He reiterated pt neither his mother or pt s/o Mike Thomas is able to provide any physical assistance to patient. SW informed there will be updates next week, and stressed the importance of family edu next week Thursday or Friday. No further questions/concenrs reported.   *SW returned phone call to pt s/o Mike Thomas who reiterated that she is unable to provide physical assistance to be as she is disabled. SW discussed above conversation with Mike Thomas, and stated there will be follow-up next week with Mike Thomas to give updates on pt progress.   [name], MSW, [place] Office: [phone] Cell: [phone] Fax: [phone]

## 2020-10-09 NOTE — Progress Notes (Signed)
Speech Language Pathology Daily Session Note  Patient Details  Name: Mike Thomas MRN: [id number] Date of Birth: Jan 13, 1951  Today's Date: 10/09/2020 SLP Individual Time: 6073-7106 SLP Individual Time Calculation (min): 55 min  Short Term Goals: Week 1: SLP Short Term Goal 1 (Week 1): Patient will demonstrate functional problem solving for functional and mildly complex tasks with Min A verbal cues. SLP Short Term Goal 2 (Week 1): Patient will demonstrate recall new, daily information with Mod verbal and visual cues. SLP Short Term Goal 3 (Week 1): Patient will demonstrate selective attention in a mildly distracting enviornment for 30 minutes with Min A verbal cue for redirection. SLP Short Term Goal 4 (Week 1): Patient will identify 2 physical and 2 cognitive deficits with Min verbal cues.  Skilled Therapeutic Interventions: Skilled treatment session focused on cognitive goals. Upon arrival, patient had been incontinent of urine. Patient initially reported the urinal "spilled over," however, urinal on the side of the bed was completely empty. Unsure if patient was unaware of incontinence or unable to problem solve how to get assistance. Patient transferred to the wheelchair with Max A due to motor planning deficits and posterior lean. Patient requested to void again but declined the commode and requested to stand while using the urinal. Patient was able to successfully void with extra time. A new brief and new pants were donned with total A due to time constraints. Patient participated in a basic medication management task with extra time and overall Min A verbal cues needed for problem solving throughout task. Patient reported fatigue throughout session. Patient transferred back to bed and left with alarm on and all needs within reach. Continue with current plan of care.      Pain Pain Assessment Pain Scale: 0-10 Pain Score: 6  Pain Type: Surgical pain Pain Location: Back Pain Orientation:  Lower Pain Radiating Towards: hip Pain Descriptors / Indicators: Aching;Sharp Pain Frequency: Intermittent Pain Onset: On-going Pain Intervention(s): Medication (See eMAR);Repositioned  Therapy/Group: Individual Therapy  Mike Thomas 10/09/2020, 3:06 PM

## 2020-10-09 NOTE — Progress Notes (Signed)
Occupational Therapy Session Note  Patient Details  Name: Mike Thomas MRN: [id number] Date of Birth: [birth year]  Today's Date: 10/09/2020 OT Individual Time: 9824-2998 OT Individual Time Calculation (min): 54 min    Short Term Goals: Week 1:  OT Short Term Goal 1 (Week 1): pt will transfer to toilet wiht CGA OT Short Term Goal 2 (Week 1): Pt will sit to stand iwht CGA to advance pants past hips OT Short Term Goal 3 (Week 1): Pt will groom in standing wiht CGA to dmeo improved standign tolerance OT Short Term Goal 4 (Week 1): Pt will don shirt wiht no VC to demo improved praxis  Skilled Therapeutic Interventions/Progress Updates:    Pt received supine with no c/o pain. Pt reporting fatigue and getting "off to a slow start this morning". Pt was agreeable to don clothes EOB. Pt was very slow to initiate bed mobility, requiring frequent redirection to task and mod cueing to come EOB. Shirt donned with supervision. Poor selective attention to task. Pt completed ambulatory transfer to the sink with min A and mod cueing for motor planning. Pt completed oral care in standing with set up assist. Hand tremor present. Pt was instructed to turn and sit in his chair. Freezing/shuffling gait without real progression present and pt with much difficulty motor planning. Attempted metronome style beat to reduce freezing and increase motor planning accuracy. Unfortunately was unsuccessful. Pt requesting to call sister. He required mod cueing to remember 3 number sequence when dialing phone. Pt completed coordination activity with 60 bpm metronome with UE and LE with crossing midline. Pt was able to briefly continue pattern but quickly fell apart and become uncoordinated. Pt was left sitting up in the w/c with all needs met, chair alarm set.    Therapy Documentation Precautions:  Precautions Precautions: Fall Precaution Comments: monitor HR Restrictions Weight Bearing Restrictions: No  Therapy/Group:  Individual Therapy  Curtis Sites 10/09/2020, 6:29 AM

## 2020-10-10 MED ORDER — CARBIDOPA-LEVODOPA 10-100 MG PO TABS
1.0000 | ORAL_TABLET | Freq: Three times a day (TID) | ORAL | Status: DC
  Administered 2020-10-10 – 2020-10-15 (×15): 1 via ORAL
  Filled 2020-10-10 (×16): qty 1

## 2020-10-10 MED ORDER — TRAZODONE HCL 50 MG PO TABS
50.0000 mg | ORAL_TABLET | Freq: Every day | ORAL | Status: DC
  Administered 2020-10-10 – 2020-10-21 (×12): 50 mg via ORAL
  Filled 2020-10-10 (×13): qty 1

## 2020-10-10 NOTE — Progress Notes (Signed)
Occupational Therapy Session Note  Patient Details  Name: Mike Thomas MRN: [id number] Date of Birth: 1951-05-10  Today's Date: 10/10/2020 OT Individual Time: 8832-5498 OT Individual Time Calculation (min): 30 min  and Today's Date: 10/10/2020 OT Missed Time: 21 Minutes Missed Time Reason: Patient fatigue   Short Term Goals: Week 1:  OT Short Term Goal 1 (Week 1): pt will transfer to toilet wiht CGA OT Short Term Goal 2 (Week 1): Pt will sit to stand iwht CGA to advance pants past hips OT Short Term Goal 3 (Week 1): Pt will groom in standing wiht CGA to dmeo improved standign tolerance OT Short Term Goal 4 (Week 1): Pt will don shirt wiht no VC to demo improved praxis  Skilled Therapeutic Interventions/Progress Updates:    1:1. Pt received in bed agreeable to OT. Pt requires encouragement to participate in tx d/t fatigue. Pt completes stand pivot transfer to/from w/c with RW and VC for larger steps. Pt requries reinforcement with safe hand placement throughout. Pt completes ambulation 50 feet to/from ADL apartment bathroom with MIN A and VC for using tiles on floor to gage large stride length/visual cue to transfer onto TTB in bathroom. Pt requires VC for sequencing TTB and VC for continuation with mobility as pt often freezes/shuffles. Pt reporting fatigue and declining further tx. Pt reporting bad night sleep impacting participation. Pt agreeable to OT follow up per POC later in day. Exited session with pt seated in bed, exit alarm on and call light in reach   Therapy Documentation Precautions:  Precautions Precautions: Fall Precaution Comments: monitor HR Restrictions Weight Bearing Restrictions: No General:   Vital Signs: Therapy Vitals Temp: 98.4 F (36.9 C) Temp Source: Oral Pulse Rate: 98 Resp: 16 BP: (!) 130/96 (alert nurse, pt tearing off paper clothes said they were tight) Patient Position (if appropriate): Lying Oxygen Therapy SpO2: 100 % O2 Device: Room  Air Pain: Pain Assessment Pain Scale: 0-10 Pain Score: Asleep Pain Type: Surgical pain Pain Location: Head Pain Orientation: Right;Left Pain Descriptors / Indicators: Aching Pain Frequency: Constant Pain Onset: On-going Pain Intervention(s): Medication (See eMAR) ADL: ADL Upper Body Dressing: Moderate cueing Where Assessed-Upper Body Dressing: Edge of bed Toileting: Moderate cueing,Moderate assistance Toilet Transfer: Moderate assistance Toilet Transfer Method: Counselling psychologist: Radiographer, therapeutic Method: Higher education careers adviser    Praxis   Exercises:   Other Treatments:     Therapy/Group: Individual Therapy  Tonny Branch 10/10/2020, 6:53 AM

## 2020-10-10 NOTE — Progress Notes (Signed)
[name], male   DOB: 1951/02/25, 71 y.o.   MRN: [id number]  Primary contact for follow-up to discuss pt d/c needs will be patient's nephew [name] [phone] (his sister [name]'s son); returning to [place] next Tuesday.  Pt brother [name] (older brother/#[phone]) will be coming in from [employer], [place] tomorrow.  [name], MSW, [place] Office: [phone] Cell: [phone] Fax: [phone]

## 2020-10-10 NOTE — Progress Notes (Signed)
Speech Language Pathology Daily Session Note  Patient Details  Name: Aidan Moten MRN: [id number] Date of Birth: July 29, 1950  Today's Date: 10/10/2020 SLP Individual Time: 1005-1045 SLP Individual Time Calculation (min): 40 min  Short Term Goals: Week 1: SLP Short Term Goal 1 (Week 1): Patient will demonstrate functional problem solving for functional and mildly complex tasks with Min A verbal cues. SLP Short Term Goal 2 (Week 1): Patient will demonstrate recall new, daily information with Mod verbal and visual cues. SLP Short Term Goal 3 (Week 1): Patient will demonstrate selective attention in a mildly distracting enviornment for 30 minutes with Min A verbal cue for redirection. SLP Short Term Goal 4 (Week 1): Patient will identify 2 physical and 2 cognitive deficits with Min verbal cues.  Skilled Therapeutic Interventions: Skilled treatment session focused on cognitive goals. Upon arrival, patient was awake in bed with only a brief on. Patient reported poor sleep but was agreeable to participate in treatment session. Patient donned paper scrubs with extra time and Min verbal cues for problem solving. Patient requested to use the bathroom and was continent of urine. Patient participated in a complex medication management task while sitting EOB with Mod verbal cues needed to self-monitor and correct errors. Task was unable to be completed, therefore, will be finished during next session. Patient left upright in bed with alarm on and all needs within reach. Continue with current plan of care.      Pain No/Denies Pain   Therapy/Group: Individual Therapy  Elonzo Sopp 10/10/2020, 12:57 PM
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
## 2020-10-10 NOTE — Progress Notes (Signed)
Physical Therapy Weekly Progress Note  Patient Details  Name: Mike Thomas MRN: [id number] Date of Birth: 1951-05-22  Beginning of progress report period: October 04, 2020 End of progress report period: October 10, 2020  Today's Date: 10/10/2020 PT Individual Time: 3976-7341 PT Individual Time Calculation (min): 55 min   Patient has met 2 of 4 short term goals.  Pt is progressing well toward mobility goals, improving independence with sit to stand transfers and straight line ambulation with RW. Pt has difficulty with transitions, and requires mod to max verbal cueing for sequencing of bed to [facility] transfer, as well as managing RW and safely transitioning from standing to sitting position. Pt presents with shuffling/festinating gait pattern and has difficulty correcting, despite max multimodal cueing. Pt will benefit from ongoing focus on balance and gait training during upcoming reporting period.  Patient continues to demonstrate the following deficits muscle weakness, decreased cardiorespiratoy endurance, decreased motor planning, decreased awareness, decreased problem solving, decreased safety awareness and decreased memory and decreased sitting balance, decreased standing balance, decreased postural control and decreased balance strategies and therefore will continue to benefit from skilled PT intervention to increase functional independence with mobility.  Patient progressing toward long term goals..  Continue plan of care.  PT Short Term Goals Week 1:  PT Short Term Goal 1 (Week 1): pt will perform sit to stand from standard height surfaces w/cga PT Short Term Goal 1 - Progress (Week 1): Met PT Short Term Goal 2 (Week 1): bed to/from wc w/min assist and minimal cues for sequencing PT Short Term Goal 2 - Progress (Week 1): Progressing toward goal PT Short Term Goal 3 (Week 1): Gait 47f w/LRAD and min assist of 1 PT Short Term Goal 3 - Progress (Week 1): Met PT Short Term Goal 4 (Week 1): pt  will ascend/descend 4 stairs w/single rail w/cga PT Short Term Goal 4 - Progress (Week 1): Progressing toward goal Week 2:  PT Short Term Goal 1 (Week 2): bed to/from wc w/min assist and minimal cues for sequencing PT Short Term Goal 2 (Week 2): pt will ambulate x100' with CGA and minimal cues for sequencing PT Short Term Goal 3 (Week 2): pt will ascend/descend 4 stairs w/single rail w/cga  Skilled Therapeutic Interventions/Progress Updates:     Py received supine in bed and agrees to therapy with gentle encouragement, and requires frequent redirection to task due to becoming internally distracted. Supine to sit with multimodal cues on logrolling technique and sequencing. Sit to stand with close supervision and stand step [facility]modA due to attempting to sit prior to being safely in front of WC. PT educates on importance of positioning prior to attempting to sit. WC transport to gym for time management. Pt attempts sit to stand [facility] and loses balance backward, [facility] in [facility]. PT removes WC and cues pt to attempt stand again and pt able to perform with close supervision. PT then places RW in front of pt and pt ambulates x60' with minA, demonstrating shuffling/festinating gait pattern, especially during turns and [facility]ing back to [facility] To provide external cue to facilitate longer stride lengths, PT places theraband on front posts of of RW. Pt ambulates additional 948 with RW and minA, continuing to festinate at times and require verbal cues for gait pattern. Pt completes x4 6" steps with bilateral hand rails and minA at hips for stability, with extra cueing for sequencing at top of platform. Pt performs stand step transfer  back to bed with CGA. Sit to supine with cues for positioning. Left supine with alarm intact and all needs within reach.  Therapy Documentation Precautions:  Precautions Precautions: Fall Precaution Comments: monitor  HR Restrictions Weight Bearing Restrictions: No   Therapy/Group: Individual Therapy  Breck Coons, PT, DPT 10/10/2020, 9:14 PM

## 2020-10-11 LAB — BASIC METABOLIC PANEL
Anion gap: 10 (ref 5–15)
BUN: 15 mg/dL (ref 8–23)
CO2: 28 mmol/L (ref 22–32)
Calcium: 9.2 mg/dL (ref 8.9–10.3)
Chloride: 93 mmol/L — ABNORMAL LOW (ref 98–111)
Creatinine, Ser: 1.18 mg/dL (ref 0.61–1.24)
GFR, Estimated: >60 mL/min (ref >60)
Glucose, Bld: 151 mg/dL — ABNORMAL HIGH (ref 70–99)
Potassium: 3.8 mmol/L (ref 3.5–5.1)
Sodium: 131 mmol/L — ABNORMAL LOW (ref 135–145)

## 2020-10-11 LAB — URINALYSIS, COMPLETE (UACMP) WITH MICROSCOPIC
Bacteria, UA: NONE SEEN
Bilirubin Urine: NEGATIVE
Glucose, UA: NEGATIVE mg/dL
Ketones, ur: 5 mg/dL — AB
Leukocytes,Ua: NEGATIVE
Nitrite: NEGATIVE
Protein, ur: NEGATIVE mg/dL
RBC / HPF: >50 RBC/hpf — ABNORMAL HIGH (ref 0–5)
Specific Gravity, Urine: 1.016 (ref 1.005–1.030)
pH: 6 (ref 5.0–8.0)

## 2020-10-11 LAB — GLUCOSE, CAPILLARY: Glucose-Capillary: 150 mg/dL — ABNORMAL HIGH (ref 70–99)

## 2020-10-11 NOTE — Progress Notes (Signed)
Physical Therapy Session Note  Patient Details  Name: Mike Thomas MRN: [id number] Date of Birth: [birth year]  Today's Date: 10/11/2020 PT Individual Time: 1437-1540 PT Individual Time Calculation (min): 63 min   Short Term Goals: Week 1:  PT Short Term Goal 1 (Week 1): pt will perform sit to stand from standard height surfaces w/cga PT Short Term Goal 1 - Progress (Week 1): Met PT Short Term Goal 2 (Week 1): bed to/from wc w/min assist and minimal cues for sequencing PT Short Term Goal 2 - Progress (Week 1): Progressing toward goal PT Short Term Goal 3 (Week 1): Gait 76f w/LRAD and min assist of 1 PT Short Term Goal 3 - Progress (Week 1): Met PT Short Term Goal 4 (Week 1): pt will ascend/descend 4 stairs w/single rail w/cga PT Short Term Goal 4 - Progress (Week 1): Progressing toward goal Week 2:  PT Short Term Goal 1 (Week 2): bed to/from wc w/min assist and minimal cues for sequencing PT Short Term Goal 2 (Week 2): pt will ambulate x100' with CGA and minimal cues for sequencing PT Short Term Goal 3 (Week 2): pt will ascend/descend 4 stairs w/single rail w/cga  Skilled Therapeutic Interventions/Progress Updates:  Patient supine in bed upon PT arrival. Patient alert and agreeable to PT session but has a few concerns initially re: sister's recent procedure, MD coverage for himself and sister, pain medications provided/ not provided overnight, and MD preference for "ibuprofen" vs "tylenol". Pt's questions answered so as to calm any anxiety  And informed as to difference in "as needed" medications vs "scheduled" medications for pain and potential reasons for having to wait for medications. Patient denied pain throughout session.  Therapeutic Activity: Bed Mobility: Patient performed supine --> sit with supervision and is able to reach seated position on EOB with minimal cues for technique. At end of session, he is able to return to supine with cue for initiation and supervision. With bed  positioning, he is able to bring BLE into hooklying and reach BUE to [facility]to assist with positioning toward [facility]under supervision with vc for technique.  Transfers: Patient performed STS throughout session to/ from various surfaces and heights. Requires consistent vc throughout for split UE hand placement and Min A improving to CGA by end of session in rise to stand. Min A to control descent to sit.  SPVT performed with Min A for directional guidance at hips for directionality of movement along with vc for required stepping pattern. Continued difficulty with backward stepping requiring extra time and tactile cueing to complete.   Gait Training:  Patient ambulated 150' x1 using RW with CGA for straight paths and Min A in turns for balance. Ambulated with improved length of step and improved time with continuous stepping. Continued slow pace and low step height with posterior pelvis rotation causing some posterior bias in balance.  Easily visually distractible and stops ambulating to change focus to distracting event. VC required for attn to task, increased step height, level gaze, upright posture.   Neuromuscular Re-ed: NMR facilitated during session with focus on standing balance. Pt guided in maintaining static balance with no UE support, changes in BUE positioning for self perturbations, and changes in BLE positioning to adjust BOS. Requires at least one UE to maintain balance once positioned with CGA other wise requires Min A to maintain. Continues to require extensive cues for all activities outside of normal, learned activities.   Pt guided in continuous reciprocation for BLE and BUE using NuStep  L3 x7.5 minutes with a 70mn rest break at 585m mark. nuStep used for increased mobility in BLE for improved step height during ambulation.  NMR performed for improvements in motor control and coordination, balance, sequencing, judgement, and self confidence/ efficacy in performing all aspects of mobility at  highest level of independence.   Patient supine in bed at end of session with brakes locked, bed alarm set, and all needs within reach.  Therapy Documentation Precautions:  Precautions Precautions: Fall Precaution Comments: monitor HR Restrictions Weight Bearing Restrictions: No  Therapy/Group: Individual Therapy  JuAlger SimonsT, DPT 10/11/2020, 4:26 PM

## 2020-10-11 NOTE — Progress Notes (Signed)
Speech Language Pathology Weekly Progress and Session Note  Patient Details  Name: Mike Thomas MRN: [id number] Date of Birth: 1951/01/18  Beginning of progress report period: October 04, 2020 End of progress report period: October 11, 2020  Today's Date: 10/11/2020 SLP Individual Time: 0272-5366 SLP Individual Time Calculation (min): 60 min  Short Term Goals: Week 1: SLP Short Term Goal 1 (Week 1): Patient will demonstrate functional problem solving for functional and mildly complex tasks with Min A verbal cues. SLP Short Term Goal 1 - Progress (Week 1): Not met SLP Short Term Goal 2 (Week 1): Patient will demonstrate recall new, daily information with Mod verbal and visual cues. SLP Short Term Goal 2 - Progress (Week 1): Not met SLP Short Term Goal 3 (Week 1): Patient will demonstrate selective attention in a mildly distracting enviornment for 30 minutes with Min A verbal cue for redirection. SLP Short Term Goal 3 - Progress (Week 1): Not met SLP Short Term Goal 4 (Week 1): Patient will identify 2 physical and 2 cognitive deficits with Min verbal cues. SLP Short Term Goal 4 - Progress (Week 1): Not met    New Short Term Goals: Week 2: SLP Short Term Goal 1 (Week 2): Patient will demonstrate functional problem solving for functional and mildly complex tasks with Min A verbal cues. SLP Short Term Goal 2 (Week 2): Patient will demonstrate selective attention in a mildly distracting enviornment for 30 minutes with Min A verbal cue for redirection. SLP Short Term Goal 3 (Week 2): Patient will identify 2 physical and 2 cognitive deficits with Min verbal cues. SLP Short Term Goal 4 (Week 2): Patient will demonstrate recall new, daily information with Mod verbal and visual cues.  Weekly Progress Updates: Patient has made minimal gains and has not met any STGs this reporting period. Currently, patient continues to demonstrate behaviors consistent with a [place] Level Vi-VII and requires overall Mod A  multimodal cues to complete functional and familiar tasks safely in regards to attention, problem solving, recall and awareness. At times, patient's participation has been limited by pain and fatigue. Patient and family education ongoing. Patient would benefit from continued skilled SLP intervention to maximize his cognitive functioning and overall functional independence prior to discharge.     Intensity: Minumum of 1-2 x/day, 30 to 90 minutes Frequency: 3 to 5 out of 7 days Duration/Length of Stay: 10/22/20 Treatment/Interventions: Cognitive remediation/compensation;Internal/external aids;Therapeutic Activities;Environmental controls;Cueing hierarchy;Functional tasks;Patient/family education   Daily Session  Skilled Therapeutic Interventions: Skilled treatment session focused on cognitive goals. Upon arrival, patient was lethargic and appeared more confused, RN made aware. Patient with intermittent language of confusion and required total A to calculate time for when he can receive his next pain medication as he just kept repeating the date. Patient's cell phone had been fixed and patient required total A to input numbers (girlfriend and his sister) into the contacts. Patient requested to use the bathroom with impulsivity noted as he attempted to stand up without assistance and instantly had to sit back down on the bed. Patient transferred to the commode with Min verbal cues for problem solving. Patient handed off to NT. Continue with current plan of care.       Pain No/Denies Pain   Therapy/Group: Individual Therapy  Michaeal Davis 10/11/2020, 3:04 PM
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
## 2020-10-11 NOTE — Progress Notes (Signed)
[name], male   DOB: 01/27/1951, 70 y.o.   MRN: [id number]  SW returned phone call to pt nephew [name] [phone]) woh expressed concerns related to a conversation he had with patient's s/o [name] in which attending mentioned possible Parkinson's disease. SW informed there was a discussion about this based, however, it had not been confirmed. SW informed will have attending follow-up with him. Concerns surrounding pt care needs, and if he will require SNF placement. SW discussed SNF placement process. SW to leave SNF list at front desk.   [name], MSW, [place] Office: [phone] Cell: [phone] Fax: [phone]

## 2020-10-11 NOTE — Progress Notes (Signed)
Occupational Therapy Weekly Progress Note  Patient Details  Name: Mike Thomas MRN: [id number] Date of Birth: 07/30/1950  Beginning of progress report period: October 04, 2020 End of progress report period: October 11, 2020  Today's Date: 10/11/2020 OT Individual Time: 3212-2482 OT Individual Time Calculation (min): 60 min    Patient has met 3 of 4 short term goals.  Pt has made steady progress this reporting period to CGA for standing during ADLs and transfers. Overall pt requires CGA for ADL performance. Pt continues to need skilled cuing for motor planning, L attention, awareness into deficits/implications for DC and encouragement to complete ADLS. Pt would benefit from skilled OT to address deficits listed below and reach S level goals.  Patient continues to demonstrate the following deficits: muscle weakness, decreased cardiorespiratoy endurance, impaired timing and sequencing, unbalanced muscle activation, motor apraxia, ataxia, decreased coordination and decreased motor planning, decreased attention to left, decreased initiation, decreased attention, decreased awareness, decreased problem solving, decreased safety awareness, decreased memory and delayed processing and decreased standing balance, decreased postural control and decreased balance strategies and therefore will continue to benefit from skilled OT intervention to enhance overall performance with BADL and iADL.  Patient progressing toward long term goals..  Continue plan of care.  OT Short Term Goals Week 1:  OT Short Term Goal 1 (Week 1): pt will transfer to toilet wiht CGA OT Short Term Goal 1 - Progress (Week 1): Met OT Short Term Goal 2 (Week 1): Pt will sit to stand iwht CGA to advance pants past hips OT Short Term Goal 2 - Progress (Week 1): Met OT Short Term Goal 3 (Week 1): Pt will groom in standing wiht CGA to dmeo improved standign tolerance OT Short Term Goal 3 - Progress (Week 1): Progressing toward goal OT Short  Term Goal 4 (Week 1): Pt will don shirt wiht no VC to demo improved praxis OT Short Term Goal 4 - Progress (Week 1): Met Week 2:  OT Short Term Goal 1 (Week 2): Pt will stand to groom with S to demo improved endurance OT Short Term Goal 2 (Week 2): Pt will verbalize 1 deficit to demo improved awareness OT Short Term Goal 3 (Week 2): Pt will recall safe hand palcement prior to transitional movement with subtle cuing OT Short Term Goal 4 (Week 2): Pt will complete functional mobility with freezing episodes <25% of time to dmeo improved motor planning  Skilled Therapeutic Interventions/Progress Updates:    1;1. Pt received in bed agreeable to OT. Pt with no pain reported but stating by end of session back is uncomfortable but better when repositioned in bed at end ofsession. W/c cushion acquired while pt on phone with sister. Pt completes ambulatory transfer into bathroom with CGA overall and VC for stepping into shower with multiple freezing episodes. Pt bathes UB 3x despite cuing as pt demo memory deficits. Pt going back to wash UB after redirecting to LB. Pt completes dressing UB with S and VC for L attention as shirt coming back down elbow and LB with max multimodal cuing and visual demonstration for pt to be abl eot thread BLE. Pt returned to bed with CGA. Exited session with pt seated in bed, exit alarm on and call light in reach   Therapy Documentation Precautions:  Precautions Precautions: Fall Precaution Comments: monitor HR Restrictions Weight Bearing Restrictions: No General:   Vital Signs: Therapy Vitals Temp: 98.4 F (36.9 C) Pulse Rate: 77 Resp: 18 BP: (!) 131/94 Patient Position (if appropriate):  Lying Oxygen Therapy SpO2: 99 % O2 Device: Room Air Pain: Pain Assessment Pain Score: Asleep ADL: ADL Upper Body Dressing: Moderate cueing Where Assessed-Upper Body Dressing: Edge of bed Toileting: Moderate cueing,Moderate assistance Toilet Transfer: Moderate  assistance Toilet Transfer Method: Counselling psychologist: Radiographer, therapeutic Method: Higher education careers adviser    Praxis   Exercises:   Other Treatments:     Therapy/Group: Individual Therapy  Tonny Branch 10/11/2020, 6:39 AM

## 2020-10-12 LAB — GLUCOSE, CAPILLARY: Glucose-Capillary: 143 mg/dL — ABNORMAL HIGH (ref 70–99)

## 2020-10-12 MED ORDER — METHYLPHENIDATE HCL 5 MG PO TABS
2.5000 mg | ORAL_TABLET | Freq: Two times a day (BID) | ORAL | Status: DC
  Administered 2020-10-12 – 2020-10-15 (×6): 2.5 mg via ORAL
  Filled 2020-10-12 (×6): qty 1
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
## 2020-10-12 NOTE — Progress Notes (Signed)
Occupational Therapy Session Note  Patient Details  Name: Mike Thomas MRN: [id number] Date of Birth: 1950/11/01  Today's Date: 10/12/2020 OT Individual Time: 1350-1450 OT Individual Time Calculation (min): 60 min    Short Term Goals: Week 1:  OT Short Term Goal 1 (Week 1): pt will transfer to toilet wiht CGA OT Short Term Goal 1 - Progress (Week 1): Met OT Short Term Goal 2 (Week 1): Pt will sit to stand iwht CGA to advance pants past hips OT Short Term Goal 2 - Progress (Week 1): Met OT Short Term Goal 3 (Week 1): Pt will groom in standing wiht CGA to dmeo improved standign tolerance OT Short Term Goal 3 - Progress (Week 1): Progressing toward goal OT Short Term Goal 4 (Week 1): Pt will don shirt wiht no VC to demo improved praxis OT Short Term Goal 4 - Progress (Week 1): Met  Skilled Therapeutic Interventions/Progress Updates:    1:1. Pt received in bed with LPN present. Pt completes sup>sitting with MAX VC for bed mobility sequencing. Pt reporting seeing "a black girl standing over the toilet" overall pt much more confused this date asking "how long have they moved me over here." Pt requires MIN A sit to stand MAX cuing for hand placement during STS transition with RW and MAX facilitation of weight shifting during turns. Pt threads BLE into pants at w/c however demo difficulty donning non skid socks/crossing LEs complaining of stiffness. OT demo sock aide however pt with increased difficulty with command following therefore OT demo/completes with step by step with pt. And pt able to don B socks, however likely just working on hip flexibility will be more appropriate to improve access to feet via figure 4 which pt can complete intermittently. Pt completes 1 min on 1 min rest of kinetron reciprocal training for equal force production through BLE and continuation skill as pt with freezing during mobility with music playing for pt to pedal to beat of the music. Carry over during ambulation back  to the room with RW with 1 freezing any time pt needed to turn but not during straight away. Pt requesting to return to room d/t fatigue and missed 15 min skilled OT. Pt requries MAX A to side step to [facility] and completely loses balaance backwards requiring A to facilitate sit onto bed. Exited session with pt seated in bed exit alarm on and call light in reach.   Therapy Documentation Precautions:  Precautions Precautions: Fall Precaution Comments: monitor HR Restrictions Weight Bearing Restrictions: No General:   Vital Signs: Therapy Vitals Temp: 99.8 F (37.7 C) Temp Source: Oral Pulse Rate: 82 Resp: 16 BP: (!) 147/113 Patient Position (if appropriate): Lying Oxygen Therapy SpO2: 98 % O2 Device: Room Air Pain:   ADL: ADL Upper Body Dressing: Moderate cueing Where Assessed-Upper Body Dressing: Edge of bed Toileting: Moderate cueing,Moderate assistance Toilet Transfer: Moderate assistance Toilet Transfer Method: Counselling psychologist: Radiographer, therapeutic Method: Higher education careers adviser    Praxis   Exercises:   Other Treatments:     Therapy/Group: Individual Therapy  Tonny Branch 10/12/2020, 6:50 AM

## 2020-10-13 LAB — BASIC METABOLIC PANEL
Anion gap: 6 (ref 5–15)
BUN: 19 mg/dL (ref 8–23)
CO2: 26 mmol/L (ref 22–32)
Calcium: 8.9 mg/dL (ref 8.9–10.3)
Chloride: 95 mmol/L — ABNORMAL LOW (ref 98–111)
Creatinine, Ser: 1.11 mg/dL (ref 0.61–1.24)
GFR, Estimated: >60 mL/min (ref >60)
Glucose, Bld: 125 mg/dL — ABNORMAL HIGH (ref 70–99)
Potassium: 3.7 mmol/L (ref 3.5–5.1)
Sodium: 127 mmol/L — ABNORMAL LOW (ref 135–145)

## 2020-10-13 LAB — SODIUM, URINE, RANDOM: Sodium, Ur: 57 mmol/L

## 2020-10-13 LAB — CBC
HCT: 42.6 % (ref 39.0–52.0)
Hemoglobin: 15 g/dL (ref 13.0–17.0)
MCH: 34 pg (ref 26.0–34.0)
MCHC: 35.2 g/dL (ref 30.0–36.0)
MCV: 96.6 fL (ref 80.0–100.0)
Platelets: 185 10*3/uL (ref 150–400)
RBC: 4.41 MIL/uL (ref 4.22–5.81)
RDW: 13 % (ref 11.5–15.5)
WBC: 7.9 10*3/uL (ref 4.0–10.5)
nRBC: 0 % (ref 0.0–0.2)

## 2020-10-13 LAB — URINE CULTURE

## 2020-10-13 LAB — OSMOLALITY: Osmolality: 278 mOsm/kg (ref 275–295)

## 2020-10-13 MED ORDER — HEPARIN SODIUM (PORCINE) 5000 UNIT/ML IJ SOLN
5000.0000 [IU] | Freq: Three times a day (TID) | INTRAMUSCULAR | Status: DC
  Administered 2020-10-14 – 2020-10-22 (×25): 5000 [IU] via SUBCUTANEOUS
  Filled 2020-10-13 (×25): qty 1

## 2020-10-13 NOTE — Progress Notes (Signed)
Speech Language Pathology Daily Session Note  Patient Details  Name: Mike Thomas MRN: [id number] Date of Birth: Dec 24, 1950  Today's Date: 10/13/2020 SLP Individual Time: 1116-1200 SLP Individual Time Calculation (min): 44 min  Short Term Goals: Week 2: SLP Short Term Goal 1 (Week 2): Patient will demonstrate functional problem solving for functional and mildly complex tasks with Min A verbal cues. SLP Short Term Goal 2 (Week 2): Patient will demonstrate selective attention in a mildly distracting enviornment for 30 minutes with Min A verbal cue for redirection. SLP Short Term Goal 3 (Week 2): Patient will identify 2 physical and 2 cognitive deficits with Min verbal cues. SLP Short Term Goal 4 (Week 2): Patient will demonstrate recall new, daily information with Mod verbal and visual cues.  Skilled Therapeutic Interventions: Pt was seen for skilled ST targeting cognitive goals. Pt attended dance group session, during which SLP provided Mod faded to [place] A verbal cueing for redirection to the targeted dance tasks (pt was somewhat distracted by his cell phone throughout session, but became more easily engaged allowing cues to be faded). He also required Min A verbal and visual cues for problem solving how/where to store his phone in order to keep it from falling while bouncing his legs. Pt required Min A verbal and visual cueing to follow the basic to mildly complex dance moves throughout session. He recalled his room number with Supervision A verbal cues and required Min A verbal cues for sequencing and safety awareness when transferring from wheelchair back to recliner upon arrival to room. Pt left sitting in recliner with seat alarm set and needs within reach. Continue per current plan of care.          Pain Pain Assessment Pain Scale: 0-10 Pain Score: 0-No pain  Therapy/Group: Individual Therapy  Arbutus Leas 10/13/2020, 7:30 AM

## 2020-10-13 NOTE — Progress Notes (Signed)
Occupational Therapy Session Note  Patient Details  Name: Mike Thomas MRN: [id number] Date of Birth: 06/17/1951  Today's Date: 10/13/2020 OT Individual Time: 0903-1000 and 1350-1430 OT Individual Time Calculation (min): 57 min and 40 min  Short Term Goals: Week 2:  OT Short Term Goal 1 (Week 2): Pt will stand to groom with S to demo improved endurance OT Short Term Goal 2 (Week 2): Pt will verbalize 1 deficit to demo improved awareness OT Short Term Goal 3 (Week 2): Pt will recall safe hand palcement prior to transitional movement with subtle cuing OT Short Term Goal 4 (Week 2): Pt will complete functional mobility with freezing episodes <25% of time to dmeo improved motor planning  Skilled Therapeutic Interventions/Progress Updates:     Pt greeted in the recliner, reporting that he showered yesterday so did not want to bathe today. Per pt, he already completed toileting "within the hour" and did not want to attempt again. Pt declining participation in all other ADL tasks today. Therefore session focus was placed on motor planning via participation in meaningful hobbies. Started with functional turning demands by engaging in modified golf using the putt putt green. Pt stood with Min A and turned with his back against the wall given Min A still and mod cues. He used the putter, standing with Min balance assist and after returned to seated, Max A for safe transfer back to the recliner due to pt not backing up fully to chair. On 2nd bout of standing to golf, pt turned more speedily towards the wall with Min A given min cues, able to transfer back to the recliner given Min A and vcs(!), pt stating "I feel my legs against the chair now." While seated, pt engaged in basketball tosses including overhead throws, bounce passes, and chest passes. 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 Pt tried to teach OT how to spin the basketball on his finger, very motivated to keep trying but ultimately  unable to do this now. He was then set up to juggle, another past hobby of his. Pt unable to juggle 3 small balls, stated he used to be able to juggle 4 in the past. He remained sitting in the recliner at close of session, all needs within reach and chair alarm set.  Pt throughout session with tangential and nonsensical speech, needing cues for redirection to task at hand  2nd Session 1:1 tx (40 min) Pt greeted in the recliner, initially refusing tx. He was amenable to seated activity to work on his "mind." Set pt up to engage in peg board task. Min A and vcs to problem solve how to don gloves prior to participation. Pt completed 2 puzzle designs, stating that the task demands were "very difficult." Perceived mental exertion 10/10. Pt reported that he had a color blind diagnosis, however able to correctly indicate colors on peg board and also on laminated sheet with 100% accuracy. His design on the peg board had ~10% accuracy pertaining to color, 75% accuracy pertaining to overall peg placement. Significantly increased time for pt to figure out how to doff his gloves at the end. At close of session pt remained sitting up in the recliner, all needs within reach and chair alarm set.     Therapy Documentation Precautions:  Precautions Precautions: Fall Precaution Comments: monitor HR Restrictions Weight Bearing Restrictions: No Pain: when OT inquired about pain, pt began talking at length about his brother. No s/s pain during either session Pain Assessment Pain Scale:  0-10 Pain Score: 0-No pain ADL: ADL Upper Body Dressing: Moderate cueing Where Assessed-Upper Body Dressing: Edge of bed Toileting: Moderate cueing,Moderate assistance Toilet Transfer: Moderate assistance Toilet Transfer Method: Counselling psychologist: Radiographer, therapeutic Method: Ambulating     Therapy/Group: Individual Therapy  Tallon Gertz A Mikinzie Maciejewski 10/13/2020, 12:26 PM
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
## 2020-10-14 LAB — BASIC METABOLIC PANEL
Anion gap: 8 (ref 5–15)
BUN: 20 mg/dL (ref 8–23)
CO2: 27 mmol/L (ref 22–32)
Calcium: 9.1 mg/dL (ref 8.9–10.3)
Chloride: 97 mmol/L — ABNORMAL LOW (ref 98–111)
Creatinine, Ser: 1.09 mg/dL (ref 0.61–1.24)
GFR, Estimated: >60 mL/min (ref >60)
Glucose, Bld: 128 mg/dL — ABNORMAL HIGH (ref 70–99)
Potassium: 4 mmol/L (ref 3.5–5.1)
Sodium: 132 mmol/L — ABNORMAL LOW (ref 135–145)
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
## 2020-10-14 NOTE — Progress Notes (Signed)
Occupational Therapy Session Note  Patient Details  Name: Mike Thomas MRN: [id number] Date of Birth: [birth year]  Today's Date: 10/14/2020 OT Individual Time: 4098-1191 OT Individual Time Calculation (min): 26 min    Short Term Goals: Week 2:  OT Short Term Goal 1 (Week 2): Pt will stand to groom with S to demo improved endurance OT Short Term Goal 2 (Week 2): Pt will verbalize 1 deficit to demo improved awareness OT Short Term Goal 3 (Week 2): Pt will recall safe hand palcement prior to transitional movement with subtle cuing OT Short Term Goal 4 (Week 2): Pt will complete functional mobility with freezing episodes <25% of time to dmeo improved motor planning  Skilled Therapeutic Interventions/Progress Updates:    Pt in bed to start session.  He was able to state reason for hospitalization but did get confused asking this therapist if I had "called him from the truck earlier?".  Therapist re-directed pt that he hadn't and this was the first time he had met me.  He was able to transition to the EOB and then worked on donning his gripper socks.  Min assist needed with pt donning part of the right sock and then stopping.  He then donned the left sock with increased time and therapist assist for orientation of grippers on the sock.  He did go back to the right sock and finish it without cueing.  Had him ambulate with the RW to the dayroom.  He was able to walk with min guard assist overall but would stop continuously when attempting to answer therapist's questions or engage in discussion demonstrating decreased divided attention.  This was also present while resting when therapist had him participate in ball toss and catch while discussing a nearby university that the pt had attended.  He was unable to complete task simultaneously and stopped and held the ball when he would talk.  Finished session with min instructional cueing for using the signs in the hallway to locate the direction of his room while  ambulating.  Finished session with pt in the wheelchair and with the call button and phone in reach.  Safety alarm in place as well.    Therapy Documentation Precautions:  Precautions Precautions: Fall Precaution Comments: monitor HR Restrictions Weight Bearing Restrictions: No  Pain: Pain Assessment Pain Scale: Faces Pain Score: 0-No pain ADL: See Care Tool Section for some details of mobility and selfcare  Therapy/Group: Individual Therapy  Ryver Poblete OTR/L 10/14/2020, 4:21 PM

## 2020-10-14 NOTE — Progress Notes (Signed)
Occupational Therapy Session Note  Patient Details  Name: Mike Thomas MRN: [id number] Date of Birth: 1951/06/23  Today's Date: 10/14/2020 OT Individual Time: 4859-2763 OT Individual Time Calculation (min): 45 min    Short Term Goals: Week 2:  OT Short Term Goal 1 (Week 2): Pt will stand to groom with S to demo improved endurance OT Short Term Goal 2 (Week 2): Pt will verbalize 1 deficit to demo improved awareness OT Short Term Goal 3 (Week 2): Pt will recall safe hand palcement prior to transitional movement with subtle cuing OT Short Term Goal 4 (Week 2): Pt will complete functional mobility with freezing episodes <25% of time to dmeo improved motor planning  Skilled Therapeutic Interventions/Progress Updates:    Pt received supine with no c/o pain. Pt agreeable to OT session. Pt completed bed mobility to EOB with supervision. Improved initiation overall this session. Visual cues placed on the ground to aid in sequencing/motor planning to turn and sit in w/c. CGA for sit > stand from EOB, good hand placement. Much improved motor planning/sequencing during transfer and turn to w/c with CGA and min cueing. Pt completed UB bathing with no cueing for initiation- just set up assist. Orientation assist required to don shirt. Min cueing for LB bathing sequencing and min A overall. Pt reporting he is having intermittent "prostate pain", pointing deep within groin area, no request for intervention- just rest breaks. Pt was left sitting up in the w/c with all needs met, chair alarm set.   Therapy Documentation Precautions:  Precautions Precautions: Fall Precaution Comments: monitor HR Restrictions Weight Bearing Restrictions: No  Therapy/Group: Individual Therapy  Curtis Sites 10/14/2020, 6:29 AM

## 2020-10-14 NOTE — Progress Notes (Signed)
[name], male   DOB: January 05, 1951, 70 y.o.   MRN: [id number]  SW met with pt and pt nephew Mike Thomas in room to discuss discharge plan. Questions related to the amount of support pt will require at discharge remain a concern since patient's significant other [name] and his sister [name] are not physically abel to assist. Discussed SNF placement as alternative. SW explained SNF placement process, reiterating that there must be a bed offer extended and insurance must approve for SNF placement. SW discussed options such as HH and PCS services if SNF placement were denied. Pt amenable to placement if needed. SW waiting on preferred SNF locations, and waiting for photocopy of SS# in efforts to obtain PASRR#.   [name], MSW, [place] Office: [phone] Cell: [phone] Fax: [phone]

## 2020-10-14 NOTE — Progress Notes (Signed)
Physical Therapy Session Note  Patient Details  Name: Mike Thomas MRN: [id number] Date of Birth: 08/13/1950  Today's Date: 10/14/2020 PT Individual Time: 1001-1100 PT Individual Time Calculation (min): 59 min   Short Term Goals: Week 2:  PT Short Term Goal 1 (Week 2): bed to/from wc w/min assist and minimal cues for sequencing PT Short Term Goal 2 (Week 2): pt will ambulate x100' with CGA and minimal cues for sequencing PT Short Term Goal 3 (Week 2): pt will ascend/descend 4 stairs w/single rail w/cga  Skilled Therapeutic Interventions/Progress Updates:     Pt received seated in [facility] and agrees to therapy. Reports pain in prostate as well as upper back. PT provides rest breaks to manage pain. Pt performs sit to stand from [facility] with CGA and pulls pants up from mid thighs with verbal cues. WC transport to gym for time management. PT instructs pt on gait pattern due to pt's tendency for parkinsonian type gait. Theraband on front of RW provided for external visual cue to increase stride length and metronome on phone used for auditory cue to step cadence. Pt initially demos increased stride length and consistent cadence, but after ambulating ~75', begins to revert to shuffling gait pattern and occasional "freezing". Pt ambulates total of 150' with RW and minA. Following seated rest break, pt ambulates same distance without AD. PT provides minA at hips and pt again starts ambulation with more typical gait pattern but with increased distance, demonstrates increase in instances of freezing, festinating gait, and unsteadiness. Pt then performs Nustep activity as NMR to perform large amplitude, reciprocal movements, as well as strengthening and endurance training. Pt performs at workload of 3. Pt completes x10:00 total with several seated rest breaks.  Pt performs forward/backward ambulation as well as sidesteps for balance training and high level gait. PT provides minA and cues for upright gaze to improve  posture and balance, increasing step length and positioning for safe transfer back to WC. Pt left seated in WC with alarm intact and all needs within reach.  Therapy Documentation Precautions:  Precautions Precautions: Fall Precaution Comments: monitor HR Restrictions Weight Bearing Restrictions: No   Therapy/Group: Individual Therapy  Breck Coons, PT, DPT 10/14/2020, 10:21 AM

## 2020-10-15 DIAGNOSIS — R259 Unspecified abnormal involuntary movements: Secondary | ICD-10-CM

## 2020-10-15 MED ORDER — TOPIRAMATE 25 MG PO TABS
25.0000 mg | ORAL_TABLET | Freq: Every day | ORAL | Status: DC
  Administered 2020-10-15 – 2020-10-21 (×7): 25 mg via ORAL
  Filled 2020-10-15 (×8): qty 1

## 2020-10-15 MED ORDER — CARBIDOPA-LEVODOPA 25-100 MG PO TABS
1.0000 | ORAL_TABLET | Freq: Three times a day (TID) | ORAL | Status: DC
  Administered 2020-10-15 – 2020-10-22 (×21): 1 via ORAL
  Filled 2020-10-15 (×21): qty 1

## 2020-10-15 MED ORDER — METHYLPHENIDATE HCL 5 MG PO TABS
5.0000 mg | ORAL_TABLET | Freq: Two times a day (BID) | ORAL | Status: DC
  Administered 2020-10-15 – 2020-10-22 (×14): 5 mg via ORAL
  Filled 2020-10-15 (×14): qty 1

## 2020-10-15 NOTE — Patient Care Conference (Signed)
Inpatient RehabilitationTeam Conference and Plan of Care Update Date: 10/15/2020   Time: 10:00 AM    Patient Name: Mike Thomas      Medical Record Number: [id number]  Date of Birth: Sep 29, 1950 Sex: Male         Room/Bed: 4W06C/4W06C-01 Payor Info: Payor: [employer] MEDICARE / Plan: [employer] HMO / Product Type: *No Product type* /    Admit Date/Time:  10/03/2020  5:09 PM  Primary Diagnosis:  Bilateral subdural hematomas [facility])  Hospital Problems: Principal Problem:   Bilateral subdural hematomas ([place]) Active Problems:   Urine culture positive   Hyponatremia   Drug induced constipation   Urinary retention   Vascular headache    Expected Discharge Date: Expected Discharge Date: 10/22/20  Team Members Present: Physician leading conference: Dr. Alger Simons Care Coodinator Present: Dorthula Nettles, RN, BSN, CRRN;[name], [place] Nurse Present: Dorthula Nettles, RN PT Present: Apolinar Junes, PT OT Present: Laverle Hobby, OT SLP Present: Weston Anna, SLP PPS Coordinator present : Gunnar Fusi, SLP     Current Status/Progress Goal Weekly Team Focus  Bowel/Bladder   Pt continent of B/B. last BM-3/19  Pt will remain continent and empty bladder fully.  assess q shift and PRN   Swallow/Nutrition/ Hydration             ADL's   Supervision UB ADLs, min A LB ADLs, still with shuffling gait and freezing during transfers. CGA transfers, poor RW management  supervision overall  Motor planning, ADLs, ADL transfers, cognitive retraining   Mobility   Supervision bed mobility, min A-CGA with RW transfers and gait >150 ft (shuffling and freezing gait demos some improvement),  min A 4 steps B rails  Supervision overall  Gait training, motor planning, functional mobility, activity tolerance, safety awareness, d/c planning, patient/caregiver education   Communication             Safety/Cognition/ Behavioral Observations  Mod A  Min A  attention, awareness, complex problem  solving   Pain   Pt complain pain to groin area(l side)  pain<3  assess pain q shift and PRN   Skin   incision on head 9L and R sides)  Pt will not develop any new skin breakdown  assess skin q shift and PRN     Discharge Planning:  Pt will d/c to home with supervision level of care from s/o or sister [name] if there is no physical assistance required. If pt requires physical assistance, pt will need SNF placement. Waiting on SNF preferred locations. SNF referral will be sent out.   Team Discussion: Started on Sinemet and Ritalin for Parkinsonian features and awareness, may increase Sinemet depending on improvement or not? Topamax for headaches, emptying bladder. Continent with incontinent episodes. Complains of groin pain. Educating on impulsivity, safety awareness, and medication. Significant other and sister can offer no physical help at discharge. If he is going to require any of this he will be SNF placement.  Patient on target to meet rehab goals: Yes, very positive turn around, much improved with Parkinsonian symptoms. Could possibly be supervision in a week? Walking up to 150 ft, min assist to contact guard with transfers. If medications are helping, could be supervision in a week? Not much improvement with cognition. Want to set up family education to show family where he is at currently.  *See Care Plan and progress notes for long and short-term goals.   Revisions to Treatment Plan:  Added Sinemet, Ritalin, and Topamax.,  Teaching Needs: Family education,  medication management, pain management, skin/wound care, transfer training, gait training, balance training, endurance training, bowel/bladder management, safety awareness.  Current Barriers to Discharge: Inaccessible home environment, Decreased caregiver support, Medical stability, Incontinence, Wound care, Lack of/limited family support, Medication compliance and Behavior  Possible Resolutions to Barriers: Continue current  medications, provide emotional support.     Medical Summary Current Status: ?improvement in parkinsonian features with sinemet/ ritalin. headaches, emptying bladder better, ongoing cognitive deficits  Barriers to Discharge: Medical stability   Possible Resolutions to [employer] Focus: daily assessment of labs/clinical appearance to guide med changes and treatment plans   Continued Need for Acute Rehabilitation Level of Care: The patient requires daily medical management by a physician with specialized training in physical medicine and rehabilitation for the following reasons: Direction of a multidisciplinary physical rehabilitation program to maximize functional independence : Yes Medical management of patient stability for increased activity during participation in an intensive rehabilitation regime.: Yes Analysis of laboratory values and/or radiology reports with any subsequent need for medication adjustment and/or medical intervention. : Yes   I attest that I was present, lead the team conference, and concur with the assessment and plan of the team.   Cristi Loron 10/15/2020, 2:58 PM

## 2020-10-15 NOTE — NC FL2 (Signed)
[place] LEVEL OF CARE SCREENING TOOL     IDENTIFICATION  Patient Name: Mike Thomas Birthdate: Mar 22, 1951 Sex: male Admission Date (Current Location): 10/03/2020  [place] and [place] Number:  [place] [id number] [place] and Address:  [facility], [place] [address], [place], [place] [address]      Provider Number: [id number]  Attending Physician Name and Address:  Meredith Staggers, MD  Relative Name and Phone Number:  Sherrilee Gilles (nephew) - [phone]    Current Level of Care: Hospital Recommended Level of Care: Nursing Facility Prior Approval Number:    Date Approved/Denied:   PASRR Number:    Discharge Plan: SNF    Current Diagnoses: Patient Active Problem List   Diagnosis Date Noted  . Urine culture positive   . Hyponatremia   . Drug induced constipation   . Urinary retention   . Vascular headache   . Bilateral subdural hematomas ([place]) 10/03/2020  . HFrEF (heart failure with reduced ejection fraction) ([place])   . Atrial fibrillation ([place])   . SDH (subdural hematoma) ([place]) 09/27/2020  . S/P craniotomy 09/27/2020  . Subdural hematoma ([place]) 09/25/2020  . Newly recognized heart murmur 12/26/2014  . Elevated blood pressure 12/26/2014  . Dupuytren's contracture 12/26/2014    Orientation RESPIRATION BLADDER Height & Weight     Self,Situation,Place  Normal Incontinent Weight: 177 lb 4 oz (80.4 kg) Height:  6' (182.9 cm)  BEHAVIORAL SYMPTOMS/MOOD NEUROLOGICAL BOWEL NUTRITION STATUS      Incontinent Diet  AMBULATORY STATUS COMMUNICATION OF NEEDS Skin   Limited Assist Verbally Normal                       Personal Care Assistance Level of Assistance  Bathing,Dressing Bathing Assistance: Limited assistance   Dressing Assistance: Limited assistance     Functional Limitations Info             SPECIAL CARE FACTORS FREQUENCY  PT (By licensed PT),OT (By licensed OT),Speech therapy     PT Frequency: 5xs per  week OT Frequency: 5xs per week     Speech Therapy Frequency: 5xs per week      Contractures Contractures Info: Not present    Additional Factors Info  Code Status Code Status Info: Full             Current Medications (10/15/2020):  This is the current hospital active medication list Current Facility-Administered Medications  Medication Dose Route Frequency Provider Last Rate Last Admin  . (feeding supplement) PROSource Plus liquid 30 mL  30 mL Oral BID BM Love, Pamela S, PA-C   30 mL at 10/15/20 1415  . acetaminophen (TYLENOL) tablet 325-650 mg  325-650 mg Oral Q4H PRN Bary Leriche, PA-C   650 mg at 10/15/20 0813  . alum & mag hydroxide-simeth (MAALOX/MYLANTA) 200-200-20 MG/5ML suspension 30 mL  30 mL Oral Q4H PRN Love, Pamela S, PA-C      . amiodarone (PACERONE) tablet 200 mg  200 mg Oral Daily Bary Leriche, PA-C   200 mg at 10/15/20 0810  . atorvastatin (LIPITOR) tablet 20 mg  20 mg Oral Daily Bary Leriche, PA-C   20 mg at 10/15/20 0809  . bisacodyl (DULCOLAX) suppository 10 mg  10 mg Rectal Daily PRN Bary Leriche, PA-C   10 mg at 10/12/20 0720  . carbidopa-levodopa (SINEMET IR) 25-100 MG per tablet immediate release 1 tablet  1 tablet Oral TID Meredith Staggers, MD   1  tablet at 10/15/20 1415  . digoxin (LANOXIN) tablet 0.125 mg  0.125 mg Oral Daily Bary Leriche, PA-C   0.125 mg at 10/15/20 0810  . diphenhydrAMINE (BENADRYL) 12.5 MG/5ML elixir 12.5-25 mg  12.5-25 mg Oral Q6H PRN Love, Pamela S, PA-C      . guaiFENesin-dextromethorphan (ROBITUSSIN DM) 100-10 MG/5ML syrup 5-10 mL  5-10 mL Oral Q6H PRN Love, Pamela S, PA-C      . heparin injection 5,000 Units  5,000 Units Subcutaneous Q8H Meredith Staggers, MD   5,000 Units at 10/15/20 1415  . levETIRAcetam (KEPPRA) tablet 500 mg  500 mg Oral BID Bary Leriche, PA-C   500 mg at 10/15/20 0810  . lidocaine (XYLOCAINE) 2 % jelly   Topical PRN Love, Pamela S, PA-C      . MEDLINE mouth rinse  15 mL Mouth Rinse BID Bary Leriche, PA-C   15 mL at 10/15/20 1025  . methylphenidate (RITALIN) tablet 5 mg  5 mg Oral BID WC Meredith Staggers, MD   5 mg at 10/15/20 1220  . metoprolol succinate (TOPROL-XL) 24 hr tablet 25 mg  25 mg Oral QPM Bary Leriche, PA-C   25 mg at 10/13/20 1731  . oxyCODONE (Oxy IR/ROXICODONE) immediate release tablet 5-10 mg  5-10 mg Oral Q4H PRN Jamse Arn, MD   10 mg at 10/15/20 1429  . pantoprazole (PROTONIX) EC tablet 40 mg  40 mg Oral QHS Bary Leriche, PA-C   40 mg at 10/14/20 2040  . polyethylene glycol (MIRALAX / GLYCOLAX) packet 17 g  17 g Oral Daily PRN Bary Leriche, PA-C   17 g at 10/14/20 2042  . prochlorperazine (COMPAZINE) tablet 5-10 mg  5-10 mg Oral Q6H PRN Love, Pamela S, PA-C       Or  . prochlorperazine (COMPAZINE) injection 5-10 mg  5-10 mg Intramuscular Q6H PRN Love, Pamela S, PA-C       Or  . prochlorperazine (COMPAZINE) suppository 12.5 mg  12.5 mg Rectal Q6H PRN Love, Pamela S, PA-C      . senna-docusate (Senokot-S) tablet 2 tablet  2 tablet Oral Q supper Bary Leriche, PA-C   2 tablet at 10/14/20 1709  . sodium phosphate (FLEET) 7-19 GM/118ML enema 1 enema  1 enema Rectal Once PRN Love, Pamela S, PA-C      . sorbitol 70 % solution 30 mL  30 mL Oral Q72H PRN Lovorn, Megan, MD   30 mL at 10/10/20 1300  . tamsulosin (FLOMAX) capsule 0.8 mg  0.8 mg Oral QPC supper Reesa Chew S, PA-C   0.8 mg at 10/14/20 1709  . topiramate (TOPAMAX) tablet 25 mg  25 mg Oral QHS Alger Simons T, MD      . traZODone (DESYREL) tablet 50 mg  50 mg Oral QHS Meredith Staggers, MD   50 mg at 10/14/20 2040     Discharge Medications: Please see discharge summary for a list of discharge medications.  Relevant Imaging Results:  Relevant Lab Results:   Additional Information SS#[id number]  Rana Snare, LCSW
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
## 2020-10-15 NOTE — Progress Notes (Signed)
Occupational Therapy Session Note  Patient Details  Name: Keric Zehren MRN: [id number] Date of Birth: June 01, 1951  Today's Date: 10/15/2020 OT Individual Time: 1503-1530 OT Individual Time Calculation (min): 27 min    Short Term Goals: Week 2:  OT Short Term Goal 1 (Week 2): Pt will stand to groom with S to demo improved endurance OT Short Term Goal 2 (Week 2): Pt will verbalize 1 deficit to demo improved awareness OT Short Term Goal 3 (Week 2): Pt will recall safe hand palcement prior to transitional movement with subtle cuing OT Short Term Goal 4 (Week 2): Pt will complete functional mobility with freezing episodes <25% of time to dmeo improved motor planning  Skilled Therapeutic Interventions/Progress Updates:  Pt greeted semi-reclined in bed and agreeable to OT treatment session. OT asked pt to see if he could recall how to pull up calendar on iphone. Pt was able to demonstrate task without cues. Worked on standing balance and dual task of alternating UEs to touch nose then OT's finger. Pt needed intermittent tactile cues to alternate UEs in order. 10 sit<>stands without RW, then pt returned to bed with supervision. Pt left semi-reclined in bed with bed alarm on, call bell in reach, and needs met.   Therapy Documentation Precautions:  Precautions Precautions: Fall Precaution Comments: monitor HR Restrictions Weight Bearing Restrictions: No Pain: Pain Assessment Pain Scale: 0-10 Pain Score: 5  Pain Type: Acute pain Pain Location: Perineum Pain Descriptors / Indicators: Aching Pain Frequency: Intermittent Pain Onset: On-going Patients Stated Pain Goal: 3 Pain Intervention(s): Repositioned    Therapy/Group: Individual Therapy  Valma Cava 10/15/2020, 3:20 PM

## 2020-10-15 NOTE — Progress Notes (Signed)
Physical Therapy Session Note  Patient Details  Name: Mike Thomas MRN: [id number] Date of Birth: [birth year]  Today's Date: 10/15/2020 PT Individual Time: 0800-0855 PT Individual Time Calculation (min): 55 min   Short Term Goals: Week 2:  PT Short Term Goal 1 (Week 2): bed to/from wc w/min assist and minimal cues for sequencing PT Short Term Goal 2 (Week 2): pt will ambulate x100' with CGA and minimal cues for sequencing PT Short Term Goal 3 (Week 2): pt will ascend/descend 4 stairs w/single rail w/cga  Skilled Therapeutic Interventions/Progress Updates:    Patient received reclined in bed, agreeable to PT. He reports "dull headache." RN present to provide AM rx. PT providing rest breaks and distractions to assist with pain management. Discussed with patient to observe for pain trends in headache- what exacerbates headache and what doesn't. He verbalized understanding. Patient able to come sit edge of bed with supervision. Donning pants with MinA, MinA for sit > stand at edge of bed to complete pulling up pants and transferring to wc via stand pivot. PT transporting patient in wc to therapy gym for time management and energy conservation. He completed 8 mins on NuStep with B LE only on level 3 with emphasis on complete and large pedal strokes. Patient with good carry over of this to gait training. He was able to ambulate 164ft, 173ft with RW and CGA. Verbal cues for larger stride length as well as visual cue of theraband across width of walker. With turns, or dual task, patient with episodes of shuffling and freezing, but was able to return to full stride length with verbal cues. Throughout session, patient perseverative on being a caregiver for his gf and sisters as well as different diets to ascribe to once d/c'd. PT able to redirect patient throughout and deferred specific diet questions to dietician. Patient requesting to return to bed prior to next therapy session. Bed alarm on, call light within  reach.   Therapy Documentation Precautions:  Precautions Precautions: Fall Precaution Comments: monitor HR Restrictions Weight Bearing Restrictions: No    Therapy/Group: Individual Therapy  Karoline Caldwell, PT, DPT, CBIS  10/15/2020, 7:38 AM

## 2020-10-15 NOTE — Plan of Care (Signed)
  Problem: RH BOWEL ELIMINATION Goal: RH STG MANAGE BOWEL WITH ASSISTANCE Description: STG Manage Bowel with supervision Assistance. Outcome: Progressing Goal: RH STG MANAGE BOWEL W/MEDICATION W/ASSISTANCE Description: STG Manage Bowel with Medication with supervision Assistance. Outcome: Progressing   Problem: RH BLADDER ELIMINATION Goal: RH STG MANAGE BLADDER WITH ASSISTANCE Description: STG Manage Bladder With supervision Assistance Outcome: Progressing   Problem: RH SKIN INTEGRITY Goal: RH STG MAINTAIN SKIN INTEGRITY WITH ASSISTANCE Description: STG Maintain Skin Integrity With supervision Assistance. Outcome: Progressing   Problem: RH SAFETY Goal: RH STG ADHERE TO SAFETY PRECAUTIONS W/ASSISTANCE/DEVICE Description: STG Adhere to Safety Precautions With supervision Assistance/Device. Outcome: Progressing Goal: RH STG DECREASED RISK OF FALL WITH ASSISTANCE Description: STG Decreased Risk of Fall With supervision Assistance. Outcome: Progressing   Problem: RH COGNITION-NURSING Goal: RH STG ANTICIPATES NEEDS/CALLS FOR ASSIST W/ASSIST/CUES Description: STG Anticipates Needs/Calls for Assist With Cues and reminders. Outcome: Progressing   Problem: RH PAIN MANAGEMENT Goal: RH STG PAIN MANAGED AT OR BELOW PT'S PAIN GOAL Description: <3 on a 0-10 pain scale. Outcome: Progressing   Problem: RH KNOWLEDGE DEFICIT BRAIN INJURY Goal: RH STG INCREASE KNOWLEDGE OF SELF CARE AFTER BRAIN INJURY Description: Patient will demonstrate knowledge of medication management, pain management, skin/wound care, safety awareness with education materials and handouts provided by staff. Outcome: Progressing   Problem: Consults Goal: RH BRAIN INJURY PATIENT EDUCATION Description: Description: See Patient Education module for eduction specifics Outcome: Progressing   

## 2020-10-15 NOTE — Progress Notes (Signed)
[name], male   DOB: 02/07/1951, 70 y.o.   MRN: [id number]  SW met with pt, pt sister [name], and pt brother [name] to provide updates from team conference, and d/c date remains 4/5. SW informed on gains made in rehab, and also pt progress fluctuates at times. SW discussed family education so family can decide if pt care is manageable since medical team believes pt could be a supervision level of care at d/c. Fam edu scheduled for Thursday 3/24 1pm-3pm. SW informed will send out SNF referral.  SW sent out SNF referral. Waiting on PASRR#.  [name], MSW, [place] Office: [phone] Cell: [phone] Fax: [phone]

## 2020-10-15 NOTE — Progress Notes (Signed)
Speech Language Pathology Daily Session Note  Patient Details  Name: Alby Schwabe MRN: [id number] Date of Birth: 09-Jul-1951  Today's Date: 10/15/2020 SLP Individual Time: 8250-0370 SLP Individual Time Calculation (min): 30 min  Short Term Goals: Week 2: SLP Short Term Goal 1 (Week 2): Patient will demonstrate functional problem solving for functional and mildly complex tasks with Min A verbal cues. SLP Short Term Goal 2 (Week 2): Patient will demonstrate selective attention in a mildly distracting enviornment for 30 minutes with Min A verbal cue for redirection. SLP Short Term Goal 3 (Week 2): Patient will identify 2 physical and 2 cognitive deficits with Min verbal cues. SLP Short Term Goal 4 (Week 2): Patient will demonstrate recall new, daily information with Mod verbal and visual cues.  Skilled Therapeutic Interventions: Skilled treatment session focused on cognitive goals. SLP facilitated session by attempting to utilize his cell phone more functionally for recall and carryover of appointments. SLP provided both moderate visual and verbal cues for problem solving and awareness while attempting to input his upcoming therapy appointment in his calendar. Patient demonstrated appropriate attention to task while sitting EOB. Patient left supine in bed with alarm on and all needs within reach. Continue with current plan of care.      Pain Pain Assessment Pain Scale: 0-10 Pain Score: 5  Pain Type: Acute pain Pain Location: Perineum Pain Descriptors / Indicators: Aching Pain Frequency: Intermittent Pain Onset: On-going Patients Stated Pain Goal: 3 Pain Intervention(s): Medication (See eMAR) (oxycodone given)  Therapy/Group: Individual Therapy  Zayli Villafuerte 10/15/2020, 3:12 PM

## 2020-10-15 NOTE — Progress Notes (Deleted)
Primary Physician/Referring:  Dione Housekeeper, MD  [name], male    DOB: 29-Mar-1951, 70 y.o.   MRN: [id number]  No chief complaint on file.  HPI:    Mike Thomas  is a 70 y.o. ***  Past Medical History:  Diagnosis Date  . Allergy   . Headache   . Hx of migraines   . Hyperlipidemia   . Hypertension    Past Surgical History:  Procedure Laterality Date  . CRANIOTOMY Bilateral 09/27/2020   Procedure: BILATERAL CRANIOTOMY FOR SUBDURAL HEMATOMA EVACUATION;  Surgeon: Eustace Moore, MD;  Location: [facility];  Service: Neurosurgery;  Laterality: Bilateral;  . KNEE SURGERY Left 1971  . TONSILLECTOMY     Family History  Problem Relation Age of Onset  . Cancer Mother 63  . Colon cancer Mother 60       died age 85  . Cancer Father 4       leukemia  . Cancer Sister        breast  . Diabetes Brother   . Cancer Brother 60       prostate  . Stomach cancer Brother        thinks dx age 61  . Rectal cancer Neg Hx   . Prostate cancer Neg Hx   . Esophageal cancer Neg Hx   . Liver cancer Neg Hx   . Pancreatic cancer Neg Hx     Social History   Tobacco Use  . Smoking status: Current Every Day Smoker    Packs/day: 0.50    Years: 30.00    Pack years: 15.00    Types: Cigarettes  . Smokeless tobacco: Never Used  Substance Use Topics  . Alcohol use: Yes    Alcohol/week: 20.0 standard drinks    Types: 20 Glasses of wine per week   Marital Status: Divorced   ROS  ***ROS  Objective  There were no vitals taken for this visit.  Vitals with BMI 10/15/2020 10/14/2020 10/14/2020  Height - - -  Weight - - -  BMI - - -  Systolic 798 921 91  Diastolic 79 69 72  Pulse 86 59 55      ***Physical Exam  Laboratory examination:   Recent Labs    10/11/20 1106 10/13/20 0444 10/14/20 0546  NA 131* 127* 132*  K 3.8 3.7 4.0  CL 93* 95* 97*  CO2 28 26 27   GLUCOSE 151* 125* 128*  BUN 15 19 20   CREATININE 1.18 1.11 1.09  CALCIUM 9.2 8.9 9.1  GFRNONAA >60 >60 >60    estimated creatinine clearance is 71.2 mL/min (by C-G formula based on SCr of 1.09 mg/dL).  CMP Latest Ref Rng & Units 10/14/2020 10/13/2020 10/11/2020  Glucose 70 - 99 mg/dL 128(H) 125(H) 151(H)  BUN 8 - 23 mg/dL 20 19 15   Creatinine 0.61 - 1.24 mg/dL 1.09 1.11 1.18  Sodium 135 - 145 mmol/L 132(L) 127(L) 131(L)  Potassium 3.5 - 5.1 mmol/L 4.0 3.7 3.8  Chloride 98 - 111 mmol/L 97(L) 95(L) 93(L)  CO2 22 - 32 mmol/L 27 26 28   Calcium 8.9 - 10.3 mg/dL 9.1 8.9 9.2  Total Protein 6.5 - 8.1 g/dL - - -  Total Bilirubin 0.3 - 1.2 mg/dL - - -  Alkaline Phos 38 - 126 U/L - - -  AST 15 - 41 U/L - - -  ALT 0 - 44 U/L - - -   CBC Latest Ref Rng & Units 10/13/2020 10/07/2020 10/04/2020  WBC 4.0 - 10.5 K/uL 7.9 8.2 7.9  Hemoglobin 13.0 - 17.0 g/dL 15.0 15.3 16.6  Hematocrit 39.0 - 52.0 % 42.6 43.4 46.9  Platelets 150 - 400 K/uL 185 167 162    Lipid Panel No results for input(s): CHOL, TRIG, LDLCALC, VLDL, HDL, CHOLHDL, LDLDIRECT in the last 8760 hours.  HEMOGLOBIN A1C No results found for: HGBA1C, MPG TSH Recent Labs    09/25/20 1833  TSH 1.660    External labs:   ***  Medications and allergies  No Known Allergies   Facility-Administered Medications Prior to Visit  Medication Dose Route Frequency Provider Last Rate Last Admin  . (feeding supplement) PROSource Plus liquid 30 mL  30 mL Oral BID BM Love, Pamela S, PA-C   30 mL at 10/15/20 1415  . acetaminophen (TYLENOL) tablet 325-650 mg  325-650 mg Oral Q4H PRN Bary Leriche, PA-C   650 mg at 10/15/20 0813  . alum & mag hydroxide-simeth (MAALOX/MYLANTA) 200-200-20 MG/5ML suspension 30 mL  30 mL Oral Q4H PRN Love, Pamela S, PA-C      . amiodarone (PACERONE) tablet 200 mg  200 mg Oral Daily Bary Leriche, PA-C   200 mg at 10/15/20 0810  . atorvastatin (LIPITOR) tablet 20 mg  20 mg Oral Daily Bary Leriche, PA-C   20 mg at 10/15/20 0809  . bisacodyl (DULCOLAX) suppository 10 mg  10 mg Rectal Daily PRN Bary Leriche, PA-C   10 mg at  10/12/20 0720  . carbidopa-levodopa (SINEMET IR) 25-100 MG per tablet immediate release 1 tablet  1 tablet Oral TID Meredith Staggers, MD   1 tablet at 10/15/20 1415  . digoxin (LANOXIN) tablet 0.125 mg  0.125 mg Oral Daily Bary Leriche, PA-C   0.125 mg at 10/15/20 0810  . diphenhydrAMINE (BENADRYL) 12.5 MG/5ML elixir 12.5-25 mg  12.5-25 mg Oral Q6H PRN Love, Pamela S, PA-C      . guaiFENesin-dextromethorphan (ROBITUSSIN DM) 100-10 MG/5ML syrup 5-10 mL  5-10 mL Oral Q6H PRN Love, Pamela S, PA-C      . heparin injection 5,000 Units  5,000 Units Subcutaneous Q8H Meredith Staggers, MD   5,000 Units at 10/15/20 1415  . levETIRAcetam (KEPPRA) tablet 500 mg  500 mg Oral BID Bary Leriche, PA-C   500 mg at 10/15/20 0810  . lidocaine (XYLOCAINE) 2 % jelly   Topical PRN Love, Pamela S, PA-C      . MEDLINE mouth rinse  15 mL Mouth Rinse BID Bary Leriche, PA-C   15 mL at 10/15/20 1025  . methylphenidate (RITALIN) tablet 5 mg  5 mg Oral BID WC Meredith Staggers, MD   5 mg at 10/15/20 1220  . metoprolol succinate (TOPROL-XL) 24 hr tablet 25 mg  25 mg Oral QPM Bary Leriche, PA-C   25 mg at 10/13/20 1731  . oxyCODONE (Oxy IR/ROXICODONE) immediate release tablet 5-10 mg  5-10 mg Oral Q4H PRN Jamse Arn, MD   10 mg at 10/15/20 1429  . pantoprazole (PROTONIX) EC tablet 40 mg  40 mg Oral QHS Bary Leriche, PA-C   40 mg at 10/14/20 2040  . polyethylene glycol (MIRALAX / GLYCOLAX) packet 17 g  17 g Oral Daily PRN Bary Leriche, PA-C   17 g at 10/14/20 2042  . prochlorperazine (COMPAZINE) tablet 5-10 mg  5-10 mg Oral Q6H PRN Love, Pamela S, PA-C       Or  . prochlorperazine (COMPAZINE) injection 5-10 mg  5-10 mg Intramuscular Q6H PRN Love, Pamela S, PA-C       Or  . prochlorperazine (COMPAZINE) suppository 12.5 mg  12.5 mg Rectal Q6H PRN Love, Pamela S, PA-C      . senna-docusate (Senokot-S) tablet 2 tablet  2 tablet Oral Q supper Bary Leriche, PA-C   2 tablet at 10/14/20 1709  . sodium phosphate  (FLEET) 7-19 GM/118ML enema 1 enema  1 enema Rectal Once PRN Love, Pamela S, PA-C      . sorbitol 70 % solution 30 mL  30 mL Oral Q72H PRN Lovorn, Megan, MD   30 mL at 10/10/20 1300  . tamsulosin (FLOMAX) capsule 0.8 mg  0.8 mg Oral QPC supper Reesa Chew S, PA-C   0.8 mg at 10/14/20 1709  . topiramate (TOPAMAX) tablet 25 mg  25 mg Oral QHS Alger Simons T, MD      . traZODone (DESYREL) tablet 50 mg  50 mg Oral QHS Meredith Staggers, MD   50 mg at 10/14/20 2040   Outpatient Medications Prior to Visit  Medication Sig Dispense Refill  . Acetaminophen (TYLENOL 8 HOUR ARTHRITIS PAIN PO) Take 1 tablet by mouth daily as needed (For pain).    Marland Kitchen amiodarone (PACERONE) 200 MG tablet Take 1 tablet (200 mg total) by mouth daily. 30 tablet 1  . rosuvastatin (CRESTOR) 20 MG tablet Take by mouth at bedtime.     . tamsulosin (FLOMAX) 0.4 MG CAPS capsule Take 0.4 mg by mouth.       Radiology:   No results found.  Cardiac Studies:   ***  EKG:   ***  ***  Assessment  No diagnosis found.   There are no discontinued medications.  No orders of the defined types were placed in this encounter.   Recommendations:   Mike Thomas is a 70 y.o. ***  ***  Patient was seen in collaboration with Dr. Marland Kitchen He also reviewed patient's chart and examined the patient. Dr. Marland Kitchen is in agreement of the plan.   During this visit I reviewed and updated: Tobacco history  allergies medication reconciliation  medical history  surgical history  family history  social history.  This note was created using a voice recognition software as a result there may be grammatical errors inadvertently enclosed that do not reflect the nature of this encounter. Every attempt is made to correct such errors.   Alethia Berthold, PA-C 10/15/2020, 2:49 PM Office: [phone]

## 2020-10-15 NOTE — Progress Notes (Signed)
Occupational Therapy Session Note  Patient Details  Name: Mike Thomas MRN: [id number] Date of Birth: August 03, 1950  Today's Date: 10/15/2020 OT Individual Time: 0935-1000 OT Individual Time Calculation (min): 25 min   Session 2: OT Individual Time: 1130-1206 OT Individual Time Calculation (min): 36 min    Short Term Goals: Week 2:  OT Short Term Goal 1 (Week 2): Pt will stand to groom with S to demo improved endurance OT Short Term Goal 2 (Week 2): Pt will verbalize 1 deficit to demo improved awareness OT Short Term Goal 3 (Week 2): Pt will recall safe hand palcement prior to transitional movement with subtle cuing OT Short Term Goal 4 (Week 2): Pt will complete functional mobility with freezing episodes <25% of time to dmeo improved motor planning  Skilled Therapeutic Interventions/Progress Updates:    Pt supine with no c/o pain. Pt verbose and cofabulating, requiring redirection to task throughout session. Mod cueing for initiation of bed mobility, very distracted by TV- OT turned off and he was more attentive to task at hand. Pt completed UB bathing and dressing with supervision. Sit > stand x3 with supervision using RW. Pt completed ambulatory transfer into the bathroom with poor dual processing of conversing with OT and mobility- often stopping to talk. He completed toileting tasks with min A. Good improvement in motor planning turn to sit on toilet but still requiring CGA and min cueing for safety. Pt donned pants with min A. He returned to his w/c and was left sitting in the w/c with all needs met, chair alarm set.   Session 2: Pt supine upon arrival with his brother and sister present. Session focused on d/c planning and family education re CLOF. Reviewed CLOF and OT POC. Pt completed bed mobility with supervision. He rushed through initial sit > stand, excited to show family and lost balance posteriorly, sitting down quickly on the bed. Cued for slower pace and correct hand placement  and then was able to perform the transfer with supervision overall. He completed short distance functional mobility in the room with CGA, cueing required for turning and safety awareness. Pt was left supine with all needs met. Bed alarm set.   Therapy Documentation Precautions:  Precautions Precautions: Fall Precaution Comments: monitor HR Restrictions Weight Bearing Restrictions: No  Therapy/Group: Individual Therapy  Curtis Sites 10/15/2020, 6:18 AM

## 2020-10-16 ENCOUNTER — Ambulatory Visit: Payer: Medicare HMO | Admitting: Student

## 2020-10-16 LAB — BASIC METABOLIC PANEL
Anion gap: 6 (ref 5–15)
BUN: 19 mg/dL (ref 8–23)
CO2: 28 mmol/L (ref 22–32)
Calcium: 9 mg/dL (ref 8.9–10.3)
Chloride: 98 mmol/L (ref 98–111)
Creatinine, Ser: 1.24 mg/dL (ref 0.61–1.24)
GFR, Estimated: >60 mL/min (ref >60)
Glucose, Bld: 112 mg/dL — ABNORMAL HIGH (ref 70–99)
Potassium: 4.4 mmol/L (ref 3.5–5.1)
Sodium: 132 mmol/L — ABNORMAL LOW (ref 135–145)
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
## 2020-10-16 NOTE — Progress Notes (Signed)
Physical Therapy Session Note  Patient Details  Name: Mike Thomas MRN: [id number] Date of Birth: 01-28-1951  Today's Date: 10/16/2020 PT Individual Time: 1103-1200 PT Individual Time Calculation (min): 57 min   Short Term Goals: Week 2:  PT Short Term Goal 1 (Week 2): bed to/from wc w/min assist and minimal cues for sequencing PT Short Term Goal 2 (Week 2): pt will ambulate x100' with CGA and minimal cues for sequencing PT Short Term Goal 3 (Week 2): pt will ascend/descend 4 stairs w/single rail w/cga  Skilled Therapeutic Interventions/Progress Updates:     Pt received supine in bed and agrees to therapy. Reports pain in head. Number not provided. PT provides rest breaks as needed to manage pain. Supine to sit slowly with verbal cues on logrolling and sequencing. Sit to stand with cues for hand placement and minA for stand step transfer to [facility], with pt demonstrating festinating gait pattern during transfer. WC transport to gym for time management. Pt participates in "drum group" in gym, working on endurance, core strength, and high amplitude movements with arms and legs. PT provides close supervision as pt stands and follows commands for rhythmic movement and tactile and auditory feedback. PT assists pt int performing various movements and increasing amplitude of movements. During activity pt ambulates 150' without AD and with minA at hips. Pt has consistent slight posterior and R lean while ambulating, and is unable to correct substantially, despite consistent cues. Pt also ambulates back to room, x100', with same assist. Pt performs sit to supine with cues on positioning. Left supine in bed with alarm intact and all needs within reach.  Therapy Documentation Precautions:  Precautions Precautions: Fall Precaution Comments: monitor HR Restrictions Weight Bearing Restrictions: No   Therapy/Group: Individual Therapy  Breck Coons, PT, DPT 10/16/2020, 4:15 PM

## 2020-10-16 NOTE — Plan of Care (Signed)
  Problem: RH Problem Solving Goal: LTG Patient will demonstrate problem solving for (SLP) Description: LTG:  Patient will demonstrate problem solving for basic/complex daily situations with cues  (SLP) Flowsheets (Taken 10/16/2020 0645) LTG: Patient will demonstrate problem solving for (SLP): Basic daily situations Note: Goal downgraded due to slow progress   Problem: RH Awareness Goal: LTG: Patient will demonstrate awareness during functional activites type of (SLP) Description: LTG: Patient will demonstrate awareness during functional activites type of (SLP) Flowsheets (Taken 10/16/2020 0645) LTG: Patient will demonstrate awareness during cognitive/linguistic activities with assistance of (SLP): Minimal Assistance - Patient > 75% Note: Goal downgraded due to slow progress

## 2020-10-16 NOTE — Progress Notes (Signed)
Occupational Therapy Session Note  Patient Details  Name: Mike [facility] MRN: [id number] Date of Birth: 1951/03/17  Today's Date: 10/16/2020 OT Individual Time: 4098-1191 OT Individual Time Calculation (min): 54 min    Short Term Goals: Week 2:  OT Short Term Goal 1 (Week 2): Pt will stand to groom with S to demo improved endurance OT Short Term Goal 2 (Week 2): Pt will verbalize 1 deficit to demo improved awareness OT Short Term Goal 3 (Week 2): Pt will recall safe hand palcement prior to transitional movement with subtle cuing OT Short Term Goal 4 (Week 2): Pt will complete functional mobility with freezing episodes <25% of time to dmeo improved motor planning  Skilled Therapeutic Interventions/Progress Updates:    Pt received supine, verbose but properly articulating frustrations/concerns re d/c. Emotional support and edu provided re CLOF and OT POC/goals. Pt declining participation in ADLs. Assisted pt in increasing accessibility with his iPhone- increasing text size, bolding text, and reducing transparency to improve his ability to visually navigate phone. Mod cueing to initiate bed mobility. Pt completed sit > stand from EOB with CGA. He demonstrated appropriate RW management with minimal UE support needed for 100 ft of functional mobility. Assist fading from CGA to supervision level. Pt required only min cueing to turn and sit in chair- only one instance of a mild freezing episode. Pt took a seated rest break while he and OT discussed d/c planning and safety in the home, especially furniture walking vs AE use. Pt completed mobility back to room, with min cueing for RW management but great improvement in stride length. Pt was left supine with all needs met, bed alarm set.   Therapy Documentation Precautions:  Precautions Precautions: Fall Precaution Comments: monitor HR Restrictions Weight Bearing Restrictions: No   Therapy/Group: Individual Therapy  Curtis Sites 10/16/2020, 6:22  AM

## 2020-10-16 NOTE — Progress Notes (Signed)
Speech Language Pathology Daily Session Note  Patient Details  Name: Mike [facility] MRN: [id number] Date of Birth: 07/27/1951  Today's Date: 10/16/2020 SLP Individual Time: 0900-0940 SLP Individual Time Calculation (min): 40 min  Short Term Goals: Week 2: SLP Short Term Goal 1 (Week 2): Patient will demonstrate functional problem solving for functional and mildly complex tasks with Min A verbal cues. SLP Short Term Goal 2 (Week 2): Patient will demonstrate selective attention in a mildly distracting enviornment for 30 minutes with Min A verbal cue for redirection. SLP Short Term Goal 3 (Week 2): Patient will identify 2 physical and 2 cognitive deficits with Min verbal cues. SLP Short Term Goal 4 (Week 2): Patient will demonstrate recall new, daily information with Mod verbal and visual cues.  Skilled Therapeutic Interventions: Skilled treatment session focused on cognitive goals. SLP facilitated session by combining all of his apps on his iphone that can be utilized for recall of daily and functional information into a folder labeled "memoy" on his home screen.  Patient required extra time and Min verbal cues to locate the folder on the screen. SLP also provided education regarding how to utilize apps for more specific information and how to input information. However, patient will need reinforcement of information and repetition. Throughout session, patient distracted by verbosity with decreased topic maintenance. Patient left upright in bed with alarm on and all needs within reach. Continue with current plan of care.      Pain No.Denies Pain  Therapy/Group: Individual Therapy  Mike Thomas 10/16/2020, 12:52 PM
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
## 2020-10-16 NOTE — Progress Notes (Signed)
Physical Therapy Session Note  Patient Details  Name: [facility] MRN: [id number] Date of Birth: [birth year]  Today's Date: 10/16/2020 PT Individual Time: 1300-1330 PT Individual Time Calculation (min): 30 min   Short Term Goals: Week 2:  PT Short Term Goal 1 (Week 2): bed to/from wc w/min assist and minimal cues for sequencing PT Short Term Goal 2 (Week 2): pt will ambulate x100' with CGA and minimal cues for sequencing PT Short Term Goal 3 (Week 2): pt will ascend/descend 4 stairs w/single rail w/cga  Skilled Therapeutic Interventions/Progress Updates:    Pt received seated in bed, agreeable to PT session. Intermittent reports of a headache throughout the day, unclear if patient experiencing pain or headache this session. Bed mobility with Supervision. Sit to stand with close Supervision to CGA throughout session with and without RW. Pt initially with posterior lean in standing, able to correct with cueing. Stand pivot transfer bed to/from w/c with RW and CGA with max cueing for sequencing and safety. Sit to stand x 6 reps from EOB with no AD and CGA with focus on UE placement and anterior weight shift with transfer. Pt returned to supine at end of session with Supervision, left seated in bed with HOB elevated to 30 degrees, needs in reach, bed alarm in place. Pt hyperverbal during session with frequent redirection needed throughout session to attend to therapy tasks.  Therapy Documentation Precautions:  Precautions Precautions: Fall Precaution Comments: monitor HR Restrictions Weight Bearing Restrictions: No    Therapy/Group: Individual Therapy   Excell Seltzer, PT, DPT, CSRS  10/16/2020, 2:58 PM

## 2020-10-16 NOTE — Progress Notes (Signed)
[name], male   DOB: 07-10-1951, 70 y.o.   MRN: [id number]  PASSR#:[phone] H  SW received updates from medical team will need family to stay until 3:30pm to allow them to meet with all disciplines. SW spoke with pt brother [name] "[name]" (older brother/#[phone]) to discus above. Amenable to stay until 3:30pm.   [name], MSW, [place] Office: [phone] Cell: [phone] Fax: [phone]

## 2020-10-17 NOTE — Progress Notes (Signed)
Physical Therapy Session Note  Patient Details  Name: [facility] MRN: [id number] Date of Birth: Dec 17, 1950  Today's Date: 10/17/2020 PT Individual Time: 0910-0953 PT Individual Time Calculation (min): 43 min   Short Term Goals: Week 2:  PT Short Term Goal 1 (Week 2): bed to/from wc w/min assist and minimal cues for sequencing PT Short Term Goal 2 (Week 2): pt will ambulate x100' with CGA and minimal cues for sequencing PT Short Term Goal 3 (Week 2): pt will ascend/descend 4 stairs w/single rail w/cga  Skilled Therapeutic Interventions/Progress Updates:    Pt received lying supine in bed. Pt reported back pain due to discomfort of bed but said he is "okay" and agreeable to therapy. Pt reported being disoriented about his location when waking up at night, verbalized "I felt like I'm in the twighlight zone." Transferred to sitting EOB supervision. Donned pants supervision. Transferred to standing CGA with RW. Gait training to therapy gym CGA with RW ~256ft. During gait, pt demonstrates posterior lean/pelvic tilt, short strides, random stops when needing to talk (unable to dual task in this regard), ER of feet (R>L), Parkinsonian freezing over bumps in the path of ambulation, and Parkinsonian shuffling when starting and stopping ambulation. Band present on RW as external cue to avoid freezing pattern. In gym, NMR with dynamic standing balance via boxing activity in which pt performed standing cross-body alternating punching; therapists' hands used as external cue. Verbal cuing to "punch quick" to facilitate large/fluid movement. Activity also used as means to facilitate trunk rotation and alleviate trunk stiffness. Performed 10x each way and pt able to perform with little difficulty. Activity progressed to forward stepping with RLE with LUE punch towards R side, moving back to starting position, and alternating with vise-versa movement. Performed 10x each way; tactile cuing used to manually initiate  punching motion. Same activity performed one additional time with mats placed on floor to step to/from as external cues. HR measured but highly variable due to A-fib/flutter (although pt asymptomatic after intervention). Gait training back to room with RW supervision ~2103ft. Pt left upright in chair with seat belt alarm turned on and needs within reach. Pt hyperverbose throughout session, although this may be due to lack of social interaction as he verbalized his health situation, along with his girlfriend's health, have greatly affected his mental health. For example, pt verbalized "I miss doing this sort of stuff with people around" when performing interventions in therapy gym.  Therapy Documentation Precautions:  Precautions Precautions: Fall Precaution Comments: monitor HR Restrictions Weight Bearing Restrictions: No   Therapy/Group: Individual Therapy  Eleonore Chiquito, SPT 10/17/2020, 3:03 PM
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
## 2020-10-17 NOTE — Progress Notes (Signed)
[name], male   DOB: 1951/04/30, 70 y.o.   MRN: [id number]  SW received SNF preference: 1) [place], 2) [place], [place]) [place], 4) [place], and 5) [facility]   SW met with pt, pt sister Mike Thomas, pt brother Mike Thomas to inform on list received. Will discuss tomorrow on best decision to plan for discharge.   [name], MSW, [place] Office: [phone] Cell: [phone] Fax: [phone]

## 2020-10-17 NOTE — Progress Notes (Signed)
[name], male   DOB: [birth year], 70 y.o.   MRN: [id number]  Entered room, introduced myself, and explained my role in patient's care while on rehab. Explained the handouts I was adding to the [employer] he was given on admission. Explain that it included information about his diagnosis, and current medications he is taking. I explained that I would be checking on him before discharge to see how he was progressing and if he had any questions while I was there, which he did not. He did have family in the room and they did have a list of nursing homes they had selected and I told them I would be more than happy to deliver it to [place], which I did. I will continue to monitor patient's progress.  Dorthula Nettles, RN3, BSN, CBIS, [place], [facility], Inpatient Rehabilitation Office [phone] Cell [phone]

## 2020-10-17 NOTE — Progress Notes (Signed)
Physical Therapy Weekly Progress Note  Patient Details  Name: Mike Thomas MRN: [id number] Date of Birth: Jan 25, 1951  Beginning of progress report period: October 10, 2020 End of progress report period: October 17, 2020  Today's Date: 10/17/2020 PT Individual Time: 4098-1191 PT Individual Time Calculation (min): 44 min   Patient has met 4 of 4 short term goals.  Pt progressing well toward mobility goals, improving independence in bed mobility, transfers, ambulation, and stair training. Pt also demonstrating improvement in level of alertness and carryover between sessions. Pt continues to demo parkinsonian gait pattern, but shuffling/festinating less frequently. Focus of coming week to be improved balance, ambulation, and DC prep.  Patient continues to demonstrate the following deficits muscle weakness, decreased cardiorespiratoy endurance, decreased motor planning, decreased awareness, decreased problem solving, decreased safety awareness and decreased memory and decreased sitting balance, decreased standing balance, decreased postural control and decreased balance strategies and therefore will continue to benefit from skilled PT intervention to increase functional independence with mobility.  Patient progressing toward long term goals..  Continue plan of care.  PT Short Term Goals Week 2:  PT Short Term Goal 1 (Week 2): bed to/from wc w/min assist and minimal cues for sequencing PT Short Term Goal 1 - Progress (Week 2): Met PT Short Term Goal 2 (Week 2): pt will ambulate x100' with CGA and minimal cues for sequencing PT Short Term Goal 2 - Progress (Week 2): Met PT Short Term Goal 3 (Week 2): pt will ascend/descend 4 stairs w/single rail w/cga PT Short Term Goal 3 - Progress (Week 2): Met Week 3:  PT Short Term Goal 1 (Week 3): STGs = LTGs  Skilled Therapeutic Interventions/Progress Updates:  Ambulation/gait training;Community reintegration;Neuromuscular re-education;DME/adaptive equipment  instruction;Stair training;UE/LE Strength taining/ROM;Wheelchair propulsion/positioning;Balance/vestibular training;Discharge planning;Therapeutic Activities;UE/LE Coordination activities;Cognitive remediation/compensation;Functional mobility training;Patient/family education;Therapeutic Exercise;Psychosocial support   Pt received supine and agrees to therapy. Supine to sit mod(I) with bed features and stand step transfer to [facility] with CGA. Brother and sister arrive for family ed. WC transport to gym for time management. Pt performs car transfer with RW and CGA. Pt also performs ramp navigation with RW and CGA, with cues for sequencing. Pt ambulates x150' with RW and CGA, with cues for upright gaze to improve posture and balance, and demonstrating improved stride length, only festinating once during L turn. Pt completes 4 6 inch steps with R hand rail and CGA. PT educates brother and sister and safe guarding technique and need for 24 hour supervision. Family verbalizes understanding. Pt left seated in WC with all needs within reach.  Therapy Documentation Precautions:  Precautions Precautions: Fall Precaution Comments: monitor HR Restrictions Weight Bearing Restrictions: No  Therapy/Group: Individual Therapy  Breck Coons, PT, DPT 10/17/2020, 4:15 PM

## 2020-10-17 NOTE — Progress Notes (Signed)
Occupational Therapy Session Note  Patient Details  Name: Mike Thomas MRN: [id number] Date of Birth: [birth year]  Today's Date: 10/17/2020 OT Individual Time: 1430-1530 OT Individual Time Calculation (min): 60 min    Short Term Goals: Week 1:  OT Short Term Goal 1 (Week 1): pt will transfer to toilet wiht CGA OT Short Term Goal 1 - Progress (Week 1): Met OT Short Term Goal 2 (Week 1): Pt will sit to stand iwht CGA to advance pants past hips OT Short Term Goal 2 - Progress (Week 1): Met OT Short Term Goal 3 (Week 1): Pt will groom in standing wiht CGA to dmeo improved standign tolerance OT Short Term Goal 3 - Progress (Week 1): Progressing toward goal OT Short Term Goal 4 (Week 1): Pt will don shirt wiht no VC to demo improved praxis OT Short Term Goal 4 - Progress (Week 1): Met  Skilled Therapeutic Interventions/Progress Updates:    1:1. Pt received in w/c agreeable to OT. Pt with no pain reported and sister and brother present for family education. Unclear DC decision as family had chosen SNF options may be exploring however educated on ADLs, mobility/funcitonal transfers to [facility] and TTB, shuffling/freezing, poor dual task processing, L inattention, impaired memory/sequencing, providing S-CGA, safe hand placement, DME recommendations and OT role/purpose. Pt demo full body dressing being able to cross into figure 4 for LB dressing and sequence with min cuing. Pt demo TTB trasnfer with OT and significantly improved stride length and gait speed, however when pt would try to walk/talk pt would stop or go back to shuffling feet. OT explained implications/limiting distraction to improve safety. Pt sister at end of session asked about life alert necklace and OT continued to reiterate that pt would need someone present for all mobility regardless of life alert as necklace will not "coach" pt through freezing episde, keep pt from sitting too soon or correct minor LOB. Unsure if family fully understanding  implications of dificits on mobility, but family had no further questions. Exited session with pt seated in bed, exit alarm on and call light in reach    Therapy Documentation Precautions:  Precautions Precautions: Fall Precaution Comments: monitor HR Restrictions Weight Bearing Restrictions: No General:   Vital Signs: Therapy Vitals Temp: 98.1 F (36.7 C) Temp Source: Oral Pulse Rate: 62 Resp: 16 BP: 107/81 Patient Position (if appropriate): Lying Oxygen Therapy SpO2: 93 % O2 Device: Room Air Pain:   ADL: ADL Upper Body Dressing: Moderate cueing Where Assessed-Upper Body Dressing: Edge of bed Toileting: Moderate cueing,Moderate assistance Toilet Transfer: Moderate assistance Toilet Transfer Method: Counselling psychologist: Radiographer, therapeutic Method: Higher education careers adviser    Praxis   Exercises:   Other Treatments:     Therapy/Group: Individual Therapy  Tonny Branch 10/17/2020, 7:00 AM

## 2020-10-17 NOTE — Progress Notes (Signed)
Speech Language Pathology Daily Session Note  Patient Details  Name: Mike Thomas MRN: [id number] Date of Birth: [birth year]  Today's Date: 10/17/2020 SLP Individual Time: 1345-1420 SLP Individual Time Calculation (min): 35 min  Short Term Goals: Week 2: SLP Short Term Goal 1 (Week 2): Patient will demonstrate functional problem solving for functional and mildly complex tasks with Min A verbal cues. SLP Short Term Goal 2 (Week 2): Patient will demonstrate selective attention in a mildly distracting enviornment for 30 minutes with Min A verbal cue for redirection. SLP Short Term Goal 3 (Week 2): Patient will identify 2 physical and 2 cognitive deficits with Min verbal cues. SLP Short Term Goal 4 (Week 2): Patient will demonstrate recall new, daily information with Mod verbal and visual cues.  Skilled Therapeutic Interventions: Skilled treatment session focused on family education with the patient's brother and sister. Both were educated regarding patient's current cognitive functioning and strategies to utilize at home to maximize problem solving, attention, recall and overall safety. SLP also reinforced the importance of 24 hour supervision to ensure safe decision making. Patient's family verbalized understanding and handouts were given to reinforce information. Patient left upright in wheelchair with family present. Continue with current plan of care.       Pain No/Denies Pain   Therapy/Group: Individual Therapy  Quang Thorpe 10/17/2020, 3:20 PM

## 2020-10-17 NOTE — Progress Notes (Signed)
Late entry; Pt very confused this am, oriented to person only. Reports that at his sister's house and needs to get home. Oriented pt. Pt later reported that has been more confused yesterday and today. Lunch tray provided and was oriented x 4 with Easter being next holiday.

## 2020-10-18 LAB — BASIC METABOLIC PANEL
Anion gap: 7 (ref 5–15)
BUN: 20 mg/dL (ref 8–23)
CO2: 25 mmol/L (ref 22–32)
Calcium: 9.1 mg/dL (ref 8.9–10.3)
Chloride: 101 mmol/L (ref 98–111)
Creatinine, Ser: 1.31 mg/dL — ABNORMAL HIGH (ref 0.61–1.24)
GFR, Estimated: 59 mL/min — ABNORMAL LOW (ref >60)
Glucose, Bld: 106 mg/dL — ABNORMAL HIGH (ref 70–99)
Potassium: 4.7 mmol/L (ref 3.5–5.1)
Sodium: 133 mmol/L — ABNORMAL LOW (ref 135–145)

## 2020-10-18 MED ORDER — SENNOSIDES-DOCUSATE SODIUM 8.6-50 MG PO TABS
2.0000 | ORAL_TABLET | Freq: Two times a day (BID) | ORAL | Status: DC
  Administered 2020-10-18 – 2020-10-22 (×9): 2 via ORAL
  Filled 2020-10-18 (×9): qty 2
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
## 2020-10-18 NOTE — Progress Notes (Signed)
Occupational Therapy Weekly Progress Note  Patient Details  Name: Mike Thomas MRN: [id number] Date of Birth: Jan 18, 1951  Beginning of progress report period: October 11, 2020 End of progress report period: October 18, 2020  Today's Date: 10/18/2020 OT Individual Time: 1000-1100 OT Individual Time Calculation (min): 60 min    Patient has met 4 of 4 short term goals.  Pt has made excellent progress this reporting period improveing from MIN-CGA overall to S for mobility and mod VC for managing freezing episodes as well as safe turns with RW. Pt has improved with motor planning and problem solving/sequencing through ADLs. Pt continues to demo poor selective attention, demo internal distraction and poor dual task processing impacting safety/awareness during ADLs/IADLs. Family was present this week for family education to demo why pt requires 24/7 supervision and CLOF.  Patient continues to demonstrate the following deficits: muscle weakness, decreased cardiorespiratoy endurance, impaired timing and sequencing, abnormal tone, motor apraxia, ataxia, decreased coordination and decreased motor planning, decreased attention to left, decreased awareness, decreased problem solving, decreased safety awareness, decreased memory and delayed processing and decreased sitting balance, decreased standing balance, decreased postural control and decreased balance strategies and therefore will continue to benefit from skilled OT intervention to enhance overall performance with BADL and iADL.  Patient progressing toward long term goals..  Continue plan of care.  OT Short Term Goals Week 2:  OT Short Term Goal 1 (Week 2): Pt will stand to groom with S to demo improved endurance OT Short Term Goal 1 - Progress (Week 2): Met OT Short Term Goal 2 (Week 2): Pt will verbalize 1 deficit to demo improved awareness OT Short Term Goal 2 - Progress (Week 2): Met OT Short Term Goal 3 (Week 2): Pt will recall safe hand palcement  prior to transitional movement with subtle cuing OT Short Term Goal 3 - Progress (Week 2): Met OT Short Term Goal 4 (Week 2): Pt will complete functional mobility with freezing episodes <25% of time to dmeo improved motor planning OT Short Term Goal 4 - Progress (Week 2): Met Week 3:  OT Short Term Goal 1 (Week 3): STG=LTG d/t ELOS  Skilled Therapeutic Interventions/Progress Updates:     Pt received in bed agreeable to OT with no pain reported ADL:  Pt completes bathing with S at sit to stand level with increased time to problem solve washign feet and crossing Les into fig 4 to bathe. Significantly improved sequencing this date compared to last week with decreased perseveration for UB bathing Pt completes UB dressing with supervision for locating pants on L due to Linattention Pt completes LB dressing with S seated EOB with increased time to orient clothing to pt Pt completes footwear with S to don slip on shoes at EOB Pt completes toileting transfer with S and increased cuing during turns for safe turns Pt completes shower/Tub transfer with freezing during turns managing RW and safe sequencing of getting into and out of shower Pt tasked to groom in standing, put away towels and gather dirty clothing and put in laundry drawer. Pt stands to groom with set up. After pt unable ot recall other 2 tasks without MAX cueing and is unable to recall where he got undressed [facility]) or where dirty clothing are located  Pt left at end of session in bed with exit alarm on, call light in reach and all needs met   Therapy Documentation Precautions:  Precautions Precautions: Fall Precaution Comments: monitor HR Restrictions Weight Bearing Restrictions: No General:  Vital Signs: Therapy Vitals Temp: 98.1 F (36.7 C) Temp Source: Oral Pulse Rate: (!) 51 Resp: 18 BP: 108/77 Patient Position (if appropriate): Lying Oxygen Therapy SpO2: 99 % O2 Device: Room Air Pain:   ADL: ADL Upper Body  Dressing: Moderate cueing Where Assessed-Upper Body Dressing: Edge of bed Toileting: Moderate cueing,Moderate assistance Toilet Transfer: Moderate assistance Toilet Transfer Method: Counselling psychologist: Radiographer, therapeutic Method: Higher education careers adviser    Praxis   Exercises:   Other Treatments:     Therapy/Group: Individual Therapy  Tonny Branch 10/18/2020, 6:53 AM

## 2020-10-18 NOTE — Progress Notes (Signed)
Physical Therapy Session Note  Patient Details  Name: Ademide Schaberg MRN: [id number] Date of Birth: [birth year]  Today's Date: 10/18/2020 PT Individual Time: 0930-1000 PT Individual Time Calculation (min): 30 min   Short Term Goals: Week 1:  PT Short Term Goal 1 (Week 1): pt will perform sit to stand from standard height surfaces w/cga PT Short Term Goal 1 - Progress (Week 1): Met PT Short Term Goal 2 (Week 1): bed to/from wc w/min assist and minimal cues for sequencing PT Short Term Goal 2 - Progress (Week 1): Progressing toward goal PT Short Term Goal 3 (Week 1): Gait 88f w/LRAD and min assist of 1 PT Short Term Goal 3 - Progress (Week 1): Met PT Short Term Goal 4 (Week 1): pt will ascend/descend 4 stairs w/single rail w/cga PT Short Term Goal 4 - Progress (Week 1): Progressing toward goal Week 2:  PT Short Term Goal 1 (Week 2): bed to/from wc w/min assist and minimal cues for sequencing PT Short Term Goal 1 - Progress (Week 2): Met PT Short Term Goal 2 (Week 2): pt will ambulate x100' with CGA and minimal cues for sequencing PT Short Term Goal 2 - Progress (Week 2): Met PT Short Term Goal 3 (Week 2): pt will ascend/descend 4 stairs w/single rail w/cga PT Short Term Goal 3 - Progress (Week 2): Met Week 3:  PT Short Term Goal 1 (Week 3): STGs = LTGs  Skilled Therapeutic Interventions/Progress Updates:    Pain:  Pt reports 5/10 hip bilat pain.  Treatment to tolerance.  Rest breaks and repositioning as needed.  Pt initially sleeping and requested pt return due to not having breakfast or meds.   Returned later in am and pt and agreeable to treatment session w/focus on gait, balance, functional gait. Pt supine to sit w/bedrail and supervision.  Dons shoes w/set up and supervision at edge of bed. Sit to stand w/rw and cga, gait >1528fx 2 w/rw and cga, decreased step length L but able to normalize w/cues.  Deviation increased w/turning, some freezing noted.  Gait 12071f>150f8fck to  room/bed no AD w/cga, one episode of min assist due to lob w/turning/distraction. But overall more fluid gait pattern w/increased step length vs w/AD on L.  Slowed cadence w/fatigue.  Pt sit to supine w/supervision.  Pt left supine w/rails up x 4, alarm set, bed in lowest position, and needs in reach.  Therapy Documentation Precautions:  Precautions Precautions: Fall Precaution Comments: monitor HR Restrictions Weight Bearing Restrictions: No    Therapy/Group: Individual Therapy  BarbCallie Fielding  [place]5/2022, 12:36 PM

## 2020-10-18 NOTE — Progress Notes (Signed)
[name], male   DOB: Oct 06, 1950, 70 y.o.   MRN: [id number]  Updates on preferred SNF locations-  1) [place] (no beds), 2) [facility] (bed offer), 3) [place] spoke with [facility] [phone]) to discuss referral. will review and follow-up; 4) [facility] (no beds), and 5) [place] spoke with Kathy/Admissions who reported no beds; states there may be swing beds available.  *SW met with pt in room, and pt on phone with sister [name]/brother [name] to discuss plan of care. Pt sister continues to report pt will need to be supervision level of care if he comes to her home. SW updated on above with regard to SNF locations if pt will still need SNF level of care. Pt sister [name] inquire about short term and long term plan for pt due to her own current health issues (leukemia and lymphoma). SW discussed if pt d/c to home it will be [facility] services which are short term and [facility] services if pt is eligible. SW encouraged them to explore ALF if needed. SW to follow-up once there are more updates from team on Tuesday. Pt brother [name] requests SW to call him with updates.   [name], MSW, [place] Office: [phone] Cell: [phone] Fax: [phone]

## 2020-10-18 NOTE — Plan of Care (Signed)
  Problem: RH BOWEL ELIMINATION Goal: RH STG MANAGE BOWEL WITH ASSISTANCE Description: STG Manage Bowel with supervision Assistance. Outcome: Progressing Goal: RH STG MANAGE BOWEL W/MEDICATION W/ASSISTANCE Description: STG Manage Bowel with Medication with supervision Assistance. Outcome: Progressing   Problem: RH BLADDER ELIMINATION Goal: RH STG MANAGE BLADDER WITH ASSISTANCE Description: STG Manage Bladder With supervision Assistance Outcome: Progressing   Problem: RH SKIN INTEGRITY Goal: RH STG MAINTAIN SKIN INTEGRITY WITH ASSISTANCE Description: STG Maintain Skin Integrity With supervision Assistance. Outcome: Progressing   Problem: RH SAFETY Goal: RH STG ADHERE TO SAFETY PRECAUTIONS W/ASSISTANCE/DEVICE Description: STG Adhere to Safety Precautions With supervision Assistance/Device. Outcome: Progressing Goal: RH STG DECREASED RISK OF FALL WITH ASSISTANCE Description: STG Decreased Risk of Fall With supervision Assistance. Outcome: Progressing   Problem: RH COGNITION-NURSING Goal: RH STG ANTICIPATES NEEDS/CALLS FOR ASSIST W/ASSIST/CUES Description: STG Anticipates Needs/Calls for Assist With Cues and reminders. Outcome: Progressing   Problem: RH PAIN MANAGEMENT Goal: RH STG PAIN MANAGED AT OR BELOW PT'S PAIN GOAL Description: <3 on a 0-10 pain scale. Outcome: Progressing   Problem: RH KNOWLEDGE DEFICIT BRAIN INJURY Goal: RH STG INCREASE KNOWLEDGE OF SELF CARE AFTER BRAIN INJURY Description: Patient will demonstrate knowledge of medication management, pain management, skin/wound care, safety awareness with education materials and handouts provided by staff. Outcome: Progressing   Problem: Consults Goal: RH BRAIN INJURY PATIENT EDUCATION Description: Description: See Patient Education module for eduction specifics Outcome: Progressing

## 2020-10-18 NOTE — Progress Notes (Signed)
Speech Language Pathology Weekly Progress and Session Note  Patient Details  Name: Mike Thomas MRN: [id number] Date of Birth: 07/02/1951  Beginning of progress report period: October 11, 2020 End of progress report period: October 18, 2020  Today's Date: 10/18/2020 SLP Individual Time: 4098-1191 SLP Individual Time Calculation (min): 45 min  Short Term Goals: Week 2: SLP Short Term Goal 1 (Week 2): Patient will demonstrate functional problem solving for functional and mildly complex tasks with Min A verbal cues. SLP Short Term Goal 1 - Progress (Week 2): Met SLP Short Term Goal 2 (Week 2): Patient will demonstrate selective attention in a mildly distracting enviornment for 30 minutes with Min A verbal cue for redirection. SLP Short Term Goal 2 - Progress (Week 2): Met SLP Short Term Goal 3 (Week 2): Patient will identify 2 physical and 2 cognitive deficits with Min verbal cues. SLP Short Term Goal 3 - Progress (Week 2): Met SLP Short Term Goal 4 (Week 2): Patient will demonstrate recall new, daily information with Mod verbal and visual cues. SLP Short Term Goal 4 - Progress (Week 2): Met    New Short Term Goals: Week 3: SLP Short Term Goal 1 (Week 3): STGs=LTGs due to ELOS  Weekly Progress Updates: Patient has made functional gains and has met 4 of 4 STGs this reporting period. Currently, patient demonstrates behaviors consistent with a [place]o Level VII and requires overall Min A multimodal cues to complete functional and familiar tasks safely in regards to problem solving, attention, awareness and recall.  Patient and family education is complete with plans to d/c home with assistance from family. Patient would benefit from continued skilled SLP intervention to maximize his cognitive functioning and functional independence prior to discharge.     Intensity: Minumum of 1-2 x/day, 30 to 90 minutes Frequency: 3 to 5 out of 7 days Duration/Length of Stay: 10/22/20 Treatment/Interventions:  Cognitive remediation/compensation;Internal/external aids;Therapeutic Activities;Environmental controls;Cueing hierarchy;Functional tasks;Patient/family education   Daily Session  Skilled Therapeutic Interventions: Skilled treatment session focused on cognitive goals. Upon arrival, patient was supine in bed with his breakfast tray in front of him. Patient reported he was not hungry and did not order a lunch tray. Therefore, SLP facilitated session by providing extra time and Min A verbal cues to locate appropriate menu items and to dial the number to request his meals. Patient appeared more distracted today and reported overwhelmed due to all of the life changes that are currently happening. Throughout the conversation, patient continues to demonstrate decreased anticipatory awareness regarding d/c planning. Patient left upright in bed with alarm on and all needs wihtin reach. Continue with current plan of care.     Pain No/Denies Pain   Therapy/Group: Individual Therapy  Teion Ballin 10/18/2020, 6:50 AM

## 2020-10-19 ENCOUNTER — Encounter (HOSPITAL_COMMUNITY): Payer: Self-pay | Admitting: Physical Medicine & Rehabilitation

## 2020-10-19 DIAGNOSIS — N179 Acute kidney failure, unspecified: Secondary | ICD-10-CM
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
## 2020-10-19 NOTE — Progress Notes (Signed)
Occupational Therapy Session Note  Patient Details  Name: Keshon Markovitz MRN: [id number] Date of Birth: 1951-05-23  Today's Date: 10/19/2020 OT Group Time:  - 60 minutes missed     Skilled Therapeutic Interventions/Progress Updates:    Pt declined group participation due to family visiting today. 60 minutes missed.   Therapy Documentation Precautions:  Precautions Precautions: Fall Precaution Comments: monitor HR Restrictions Weight Bearing Restrictions: (P) No Vital Signs: Therapy Vitals Temp: 97.6 F (36.4 C) Pulse Rate: (!) 59 Resp: 20 BP: 97/81 Patient Position (if appropriate): Lying Oxygen Therapy SpO2: 98 % O2 Device: Room Air Pain: Pain Assessment Pain Scale: 0-10 Pain Score: 0-No pain Pain Type: Chronic pain Pain Location: Back Pain Orientation: Lower Pain Descriptors / Indicators: Aching Pain Frequency: Intermittent Pain Onset: Awakened from sleep Patients Stated Pain Goal: 2 Pain Intervention(s): Medication (See eMAR) ADL: ADL Upper Body Dressing: Moderate cueing Where Assessed-Upper Body Dressing: Edge of bed Toileting: Moderate cueing,Moderate assistance Toilet Transfer: Moderate assistance Toilet Transfer Method: Insurance claims handler Equipment: Radiographer, therapeutic Method: Ambulating      Therapy/Group: Group Therapy  Ming Kunka A Jacqualine Weichel 10/19/2020, 7:14 AM

## 2020-10-20 NOTE — Progress Notes (Signed)
Physical Therapy Session Note  Patient Details  Name: Traci Plemons MRN: [id number] Date of Birth: 14-Jul-1951  Today's Date: 10/20/2020 PT Individual Time: 0801-0856 PT Individual Time Calculation (min): 55 min   Short Term Goals: Week 2:  PT Short Term Goal 1 (Week 2): bed to/from wc w/min assist and minimal cues for sequencing PT Short Term Goal 1 - Progress (Week 2): Met PT Short Term Goal 2 (Week 2): pt will ambulate x100' with CGA and minimal cues for sequencing PT Short Term Goal 2 - Progress (Week 2): Met PT Short Term Goal 3 (Week 2): pt will ascend/descend 4 stairs w/single rail w/cga PT Short Term Goal 3 - Progress (Week 2): Met  Skilled Therapeutic Interventions/Progress Updates: Pt presents supine in bed and agreeable to therapy.  Pt transfers sup to sit using side rail w/ mod I. Pt transfers sit to stand from bed w/ CGA and then amb w/o AD x 5' to w/c.  Pt wheeled to gym for energy and time conservation.  Pt amb multiple trials w/ RW and CGA up to 150' to main gym.  Pt requires only occasional verbal cues for posture and step length w/ use of T-band on RW for visual input.  Pt performed standing toe-taps to 6" platform , verbal cueing for speed, safety.  Pt negotiated through obstacle course, through cones and stepping over canes w/o UE support.  Pt amb w/o AD w/ cueing for posture, and performing quick stop/starts w/o LOB.  Pt returned to room and amb to bed.  Pt transfers sit to supine w/ supervision.  Bed alarm on and all needs in reach.     Therapy Documentation Precautions:  Precautions Precautions: Fall Precaution Comments: monitor HR Restrictions Weight Bearing Restrictions: No General:   Vital Signs:  Pain:3/10 back Pain Assessment Pain Scale: 0-10 Pain Score: 6  Pain Type: Chronic pain Pain Location: Groin Pain Orientation: Right;Left Pain Descriptors / Indicators: Aching;Burning Pain Frequency: Occasional Pain Onset: Gradual Patients Stated Pain Goal:  0 Pain Intervention(s): Medication (See eMAR) Mobility:   L   Therapy/Group: Individual Therapy  Ladoris Gene 10/20/2020, 8:57 AM

## 2020-10-21 LAB — CBC
HCT: 46.6 % (ref 39.0–52.0)
Hemoglobin: 15.4 g/dL (ref 13.0–17.0)
MCH: 33.1 pg (ref 26.0–34.0)
MCHC: 33 g/dL (ref 30.0–36.0)
MCV: 100.2 fL — ABNORMAL HIGH (ref 80.0–100.0)
Platelets: 151 10*3/uL (ref 150–400)
RBC: 4.65 MIL/uL (ref 4.22–5.81)
RDW: 13.1 % (ref 11.5–15.5)
WBC: 4.3 10*3/uL (ref 4.0–10.5)
nRBC: 0 % (ref 0.0–0.2)

## 2020-10-21 LAB — BASIC METABOLIC PANEL
Anion gap: 6 (ref 5–15)
BUN: 16 mg/dL (ref 8–23)
CO2: 26 mmol/L (ref 22–32)
Calcium: 9.1 mg/dL (ref 8.9–10.3)
Chloride: 102 mmol/L (ref 98–111)
Creatinine, Ser: 1.37 mg/dL — ABNORMAL HIGH (ref 0.61–1.24)
GFR, Estimated: 56 mL/min — ABNORMAL LOW (ref >60)
Glucose, Bld: 150 mg/dL — ABNORMAL HIGH (ref 70–99)
Potassium: 4 mmol/L (ref 3.5–5.1)
Sodium: 134 mmol/L — ABNORMAL LOW (ref 135–145)

## 2020-10-21 NOTE — Progress Notes (Signed)
Occupational Therapy Session Note  Patient Details  Name: Mike Thomas MRN: [id number] Date of Birth: [birth year]  Today's Date: 10/21/2020 OT Individual Time: 0815-0900 OT Individual Time Calculation (min): 45 min    Short Term Goals: Week 3:  OT Short Term Goal 1 (Week 3): STG=LTG d/t ELOS  Skilled Therapeutic Interventions/Progress Updates:    Pt supine, legs hanging off the bed with no c/o pain. Pt AxO x4. Pt frankly discussing CLOF and demonstrating improved insight- emergent, not yet anticipatory. Pt declining ADLs this session but agreeable to head down to therapy gym. He completed 300 ft of functional mobility with RW at supervision level. Much improved stride length and no freezing episodes. Pt completed tub transfer using TTB to simulate home environment. He was able to do this with supervision overall. He then completed standing level reciprocal stepping activity to work on BLE coordination and overall functional activity tolerance to reduce fall risk at home. Lastly, pt completed dynamic PNF D1  pattern diagonals to increase core stabilization, UE coordination and dynamic standing balance. Pt required supervision for all activity. He returned to his room and was left supine with all needs met.   Therapy Documentation Precautions:  Precautions Precautions: Fall Precaution Comments: monitor HR Restrictions Weight Bearing Restrictions: No  Therapy/Group: Individual Therapy  Curtis Sites 10/21/2020, 6:16 AM
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
## 2020-10-21 NOTE — Progress Notes (Signed)
Occupational Therapy Discharge Summary  Patient Details  Name: Mike Thomas MRN: [id number] Date of Birth: [birth year]   Patient has met 11 of 11 long term goals due to improved activity tolerance, improved balance, postural control, ability to compensate for deficits, functional use of  LEFT upper extremity, improved attention, improved awareness and improved coordination.  Patient to discharge at overall Supervision level.  Patient's care partner is independent to provide the necessary physical assistance at discharge. Family education has been completed with pt's sister and brother.   Reasons goals not met: All treatment goals met.   Recommendation:  Patient will benefit from ongoing skilled OT services in home health setting to continue to advance functional skills in the area of BADL and iADL.  Equipment: No equipment provided, family already has a recommended TTB   Reasons for discharge: treatment goals met and discharge from hospital  Patient/family agrees with progress made and goals achieved: Yes  OT Discharge Precautions/Restrictions  Precautions Precautions: Fall Restrictions Weight Bearing Restrictions: No  Pain Pain Assessment Pain Scale: 0-10 Pain Score: 0-No pain Pain Type: Chronic pain Pain Location: Groin Pain Orientation: Right;Left Pain Descriptors / Indicators: Aching;Burning Pain Frequency: Intermittent Pain Onset: On-going Patients Stated Pain Goal: 2 Pain Intervention(s): Medication (See eMAR) ADL ADL Eating: Set up Where Assessed-Eating: Edge of bed Grooming: Supervision/safety Where Assessed-Grooming: Standing at sink Upper Body Bathing: Supervision/safety,Setup Where Assessed-Upper Body Bathing: Shower Lower Body Bathing: Supervision/safety,Setup Where Assessed-Lower Body Bathing: Shower Upper Body Dressing: Supervision/safety Where Assessed-Upper Body Dressing: Chair Lower Body Dressing: Supervision/safety,Setup Where Assessed-Lower Body  Dressing: Chair Toileting: Setup,Supervision/safety Where Assessed-Toileting: Glass blower/designer: Close supervision Toilet Transfer Method: Counselling psychologist: Radiographer, therapeutic: Close supervison Clinical cytogeneticist Method: Optometrist: Civil engineer, contracting with back Vision Baseline Vision/History: Wears glasses Wears Glasses: Reading only Patient Visual Report: No change from baseline Vision Assessment?: No apparent visual deficits Perception  Perception: Impaired Inattention/Neglect: Does not attend to left side of body (much improved, still requires intermittent cueing) Praxis Praxis: Impaired Praxis Impairment Details: Motor planning Praxis-Other Comments: Much improved, often has difficulty orienting clothes Cognition Overall Cognitive Status: Impaired/Different from baseline Arousal/Alertness: Awake/alert Orientation Level: Oriented X4 Attention: Selective Sustained Attention: Appears intact Selective Attention: Impaired Selective Attention Impairment: Verbal complex;Functional complex Memory: Impaired Memory Impairment: Decreased recall of new information;Decreased short term memory Decreased Short Term Memory: Functional basic;Verbal basic Awareness Impairment: Emergent impairment Problem Solving: Impaired Problem Solving Impairment: Verbal basic;Functional basic Safety/Judgment: Impaired [place] [employer] Scales of Cognitive Functioning: Purposeful/appropriate Sensation Sensation Light Touch: Appears Intact Hot/Cold: Appears Intact Proprioception: Appears Intact Coordination Gross Motor Movements are Fluid and Coordinated: No Fine Motor Movements are Fluid and Coordinated: Yes Coordination and Movement Description: Shuffling gait, freezing- some parkinsonism movement pattern Motor  Motor Motor: Hemiplegia Motor - Discharge Observations: Very mild L hemi, mostly resolved Mobility  Transfers Sit to Stand:  Supervision/Verbal cueing Stand to Sit: Supervision/Verbal cueing  Trunk/Postural Assessment  Cervical Assessment Cervical Assessment: Exceptions to [facility] (forward head) Thoracic Assessment Thoracic Assessment: Exceptions to [facility] (kyphotic posture) Lumbar Assessment Lumbar Assessment: Exceptions to [facility] (posterior pelvic tilt) Postural Control Postural Control: Deficits on evaluation Righting Reactions: delayed L  Balance Balance Balance Assessed: Yes Dynamic Sitting Balance Dynamic Sitting - Balance Support: Feet supported Dynamic Sitting - Level of Assistance: 6: Modified independent (Device/Increase time) Dynamic Standing Balance Dynamic Standing - Balance Support: Bilateral upper extremity supported Dynamic Standing - Level of Assistance: 5: Stand by assistance Extremity/Trunk Assessment RUE Assessment RUE Assessment: Within Functional Limits LUE  Assessment LUE Assessment: Within Functional Limits   Curtis Sites 10/21/2020, 8:28 AM

## 2020-10-21 NOTE — Progress Notes (Signed)
[name], male   DOB: [birth year], 70 y.o.   MRN: [id number]  Per medical team, pt will need 24/7 supervision at discharge due to cognition and safety.   1009- SW spoke with   SW met with pt in room to discuss HHA preference. No HHA preference. SW sent referral to [facility] for HHPT/OT/SLP/SW/aide and [facility]. SW waiting on updates.    SW received message from pt son [name] [phone]) concerns on pt discharging to her home since she has memory defecits and can not provide physical assistance.   1433-SW receturned phone call to pt son Joe to inform on above, and informed on limitations if pt mother is agreeing. SW informed on efforts for HHSW to be put in place if there are any issues.   [name], MSW, [place] Office: [phone] Cell: [phone] Fax: [phone]

## 2020-10-21 NOTE — Progress Notes (Signed)
Speech Language Pathology Daily Session Note  Patient Details  Name: Mike Thomas MRN: [id number] Date of Birth: [birth year]  Today's Date: 10/21/2020 SLP Individual Time: 1100-1125 SLP Individual Time Calculation (min): 25 min  Short Term Goals: Week 3: SLP Short Term Goal 1 (Week 3): STGs=LTGs due to ELOS  Skilled Therapeutic Interventions: Skilled treatment session focused on cognitive goals. SLP facilitated session by re-administering the [facility] Mental Status Examination (SLUMS) and patient scored  20/30 points with a score of 27 or above considered normal. Although patient scored below normal limits, his score improved by 4 points since initial assessment. Patient continues to demonstrate deficits in problem solving and recall. Educated patient on results of assessment and importance of 24 hour supervision to maximize recall and overall safety. He verbalized understanding and agreement. Patient left in bed with alarm on and all needs within reach. Continue with current plan of care.        Pain Pain Assessment Pain Scale: 0-10 Pain Score: 0-No pain  Therapy/Group: Individual Therapy  Mike Thomas, [place] 10/21/2020, 12:19 PM

## 2020-10-21 NOTE — Progress Notes (Signed)
Physical Therapy Discharge Summary  Patient Details  Name: Mike Thomas MRN: [id number] Date of Birth: September 12, 1950  Today's Date: 10/21/2020 PT Individual Time: 1000-1100 and 1300-1400 PT Individual Time Calculation (min): 60 min and 60 min    Patient has met 8 of 9 long term goals due to improved activity tolerance, improved balance, improved postural control, increased strength, ability to compensate for deficits, improved attention, improved awareness and improved coordination.  Patient to discharge at an ambulatory level Supervision.   Patient's care partner sister, brother x 1 week to provide the necessary supervision/ assistance at discharge.  Pt has made excellent progress and will not need wheelchair for DC.  Has progressed to level surface gait w/no AD and supervision only. Functional mobility in complex environments will require further assessment due to safety awarenss/cognition/high level balance deficits.  Reasons goals not met: NA all goals met, one goal discontinued/no longer applicable  Recommendation:  Patient will benefit from ongoing skilled PT services in home health setting to continue to advance safe functional mobility, address ongoing impairments in attention, LLE weakness, balance, safety awareness, and minimize fall risk.  Equipment: No equipment provided  Reasons for discharge: treatment goals met  Patient/family agrees with progress made and goals achieved: Yes   Therapeutic Interventions: AM SESSION PAIN pt denies pain ePt initially supine, agreeable to session. Supine to/from sit and all bed mobility conpleted independently. Gait trials: Sit to st>156f no AD w/supervision, decreased clearance RLE approx 10% but does not result in balance loss, mild clearance deficit, pt w/decreased awareness of this. Gait 1534fw/RW, deficits as above, mild shuffling tendency w/turning but significantly improved from last week.  Gait including stepping over objects of  vaying widths and heights, weaving around cones placed in various intervals of distance, supervision, no AD  Ramps - ascends/descends 1218famp w/no rail w/supervision no AD  Mulch - gait thru mulch 29f94fult passes, min assist or supervision w/sigle rail, increased shuffling tendency w/uneven mulch surface.  Car transfer - supervision no AD  Picking up objects from floor - supervision no AD  Gait >150ft42fAD supervision, deviations as above.  Transfers to bed w/supervisoin. Pt left supine w/rails up x 3, alarm set, bed in lowest position, and needs in reach.   PM SESSION Pain:  Pt DENIES pain.  Treatment to tolerance.  Rest breaks and repositioning as needed.  Pt initially supine and agreeable to treatment session w/focus on dual tasking, functional mobility  Gait:  >250ft 30fD including opening heavy door, standing on moving elevator, moving quickly to catch elevator when he was delayed responding to call bell/light indicator. All w/supervision.  Pathfinding: Pt instructed to attend to path en route to [employer] on first floor of hospital.  Therapist pointed out specific landmarks en route to aid in recall.  Pt required cues to operate elevator and to utilize signs to identify location of [facility].   Returning pt had much difficulty w/elevator operation/calling elevator, awareness of elevator arrival, recalling floor and name of floor (IPR) as listed on sign in elevator.  Pt also unable to recall heavy door and attempted to return via incorrect hallway, then once on 4midwe66m pt had difficulty determining if this was location of his room, also did not recall without cues that task started in main gym/location of main gym.  Was able to correct w/cuing, additional time.  Cognition: pt was able to scanning menu for items/prices w/additional time.  Was able to etermining if stated sum of money would  cover certain combinations of 2 to 3 items w/additional time, some cues. Pt w/many  questions today for this therapist.   Education included: General info regarding SDH Mechanisms of injury Determination of long ternm deficits/reassessment/assessment of cognition and role of Neuropsych w/this. Alcohol use/abuse and effect on recovery, alcoholism, followup w/MD. Typical DC protocol day of dc.  Pt left supine w/rails up x 3, alarm set, bed in lowest position, and needs in reach.      PT Discharge Precautions/Restrictions Precautions Precautions: Fall Restrictions Weight Bearing Restrictions: No Pain Pain Assessment Pain Scale: 0-10 Pain Score: 0-No pain Vision/Perception  Perception Perception: Impaired Inattention/Neglect: Does not attend to left side of body Praxis Praxis: Impaired Praxis Impairment Details: Motor planning Praxis-Other Comments: has improved significantly w/functional mobility  Cognition Overall Cognitive Status: Impaired/Different from baseline Arousal/Alertness: Awake/alert Orientation Level: Oriented X4 Attention: Selective Sustained Attention: Appears intact Selective Attention: Impaired Selective Attention Impairment: Verbal complex;Functional complex Memory: Impaired Memory Impairment: Decreased recall of new information;Decreased short term memory Decreased Short Term Memory: Functional basic;Verbal basic Awareness Impairment: Emergent impairment Problem Solving: Impaired Problem Solving Impairment: Verbal basic;Functional basic Safety/Judgment: Impaired [employer] Scales of Cognitive Functioning: Purposeful/appropriate Sensation Sensation Light Touch: Appears Intact Hot/Cold: Appears Intact Proprioception: Appears Intact Coordination Gross Motor Movements are Fluid and Coordinated: No Fine Motor Movements are Fluid and Coordinated: Yes Coordination and Movement Description: Shuffling gait, freezing- some parkinsonism movement pattern Motor  Motor Motor: Hemiplegia Motor - Discharge Observations: Very mild L  hemi, mostly resolved, L hip w/3+-4-/5  Mobility Transfers Transfers: Sit to Stand;Stand to Sit;Stand Pivot Transfers Sit to Stand: Supervision/Verbal cueing Stand to Sit: Supervision/Verbal cueing Stand Pivot Transfers: Supervision/Verbal cueing Transfer (Assistive device): Rolling walker Locomotion  Gait Ambulation: Yes Gait Assistance: Supervision/Verbal cueing Gait Distance (Feet): 150 Feet Assistive device: Rolling walker Gait Assistance Details:  (very slight decreased clearance LLE approx 10% steps, mild shuffling tendency only when turning.  Very little difference w/and without AD) Gait Gait: Yes Gait Pattern:  (see above) Gait velocity: reduced Stairs / Additional Locomotion Stairs: Yes Stairs Assistance: Supervision/Verbal cueing Stair Management Technique: Two rails Number of Stairs: 4 Height of Stairs: 6  Trunk/Postural Assessment  Cervical Assessment Cervical Assessment: Exceptions to [facility] (forward head) Thoracic Assessment Thoracic Assessment: Exceptions to [facility] (kyphotic posture) Lumbar Assessment Lumbar Assessment: Exceptions to [facility] (posterior pelvic tilt) Postural Control Postural Control: Deficits on evaluation Righting Reactions: delayed L  Balance Balance Balance Assessed: Yes Dynamic Sitting Balance Dynamic Sitting - Balance Support: Feet supported Dynamic Sitting - Level of Assistance: 6: Modified independent (Device/Increase time);7: Independent Dynamic Sitting - Balance Activities: Lateral lean/weight shifting;Forward lean/weight shifting;Reaching for objects Dynamic Standing Balance Dynamic Standing - Balance Support: Bilateral upper extremity supported Dynamic Standing - Level of Assistance: 5: Stand by assistance Dynamic Standing - Balance Activities: Reaching for objects;Reaching across midline Dynamic Standing - Comments: able to pick up objects off of floor no AD w/supervision only. Extremity Assessment  RUE Assessment RUE Assessment:  Within Functional Limits LUE Assessment LUE Assessment: Within Functional Limits RLE Assessment RLE Assessment: Within Functional Limits LLE Assessment General Strength Comments: 4-/5 at hip, 4+/5 hamstrings, 5/5 quads and at ankle  Callie Fielding, [place] 10/21/2020, 10:42 AM

## 2020-10-21 NOTE — Progress Notes (Signed)
Speech Language Pathology Discharge Summary  Patient Details  Name: Mike Thomas MRN: [id number] Date of Birth: 1951/05/25   Patient has met 4 of 4 long term goals.  Patient to discharge at overall Supervision;Min level.   Reasons goals not met: N/A   Clinical Impression/Discharge Summary: Patient demonstrates behaviors consistent with a [place]o Level VIII and requires overall supervision-Min A verbal cues to complete functional and familiar tasks safely regarding problem solving, recall, awareness and attention. Patient and family education is complete and patient will discharge home with 24 hour supervision from family. Patient would benefit from f/u SLP services to maximize his cognitive functioning and overall functional independence in order to reduce caregiver burden.   Care Partner:  Caregiver Able to Provide Assistance: Yes  Type of Caregiver Assistance: Physical;Cognitive  Recommendation:  24 hour supervision/assistance;Home Health SLP  Rationale for SLP Follow Up: Reduce caregiver burden;Maximize cognitive function and independence   Equipment: N/A   Reasons for discharge: Discharged from hospital;Treatment goals met   Patient/Family Agrees with Progress Made and Goals Achieved: Yes    [place], [place] 10/21/2020, 12:26 PM

## 2020-10-22 MED ORDER — AMIODARONE HCL 200 MG PO TABS: 200.0000 mg | ORAL_TABLET | Freq: Every day | ORAL | 1 refills | Status: DC

## 2020-10-22 MED ORDER — DIGOXIN 125 MCG PO TABS: 0.1250 mg | ORAL_TABLET | Freq: Every day | ORAL | 0 refills | Status: DC

## 2020-10-22 MED ORDER — OXYCODONE HCL 5 MG PO TABS: 5.0000 mg | ORAL_TABLET | Freq: Four times a day (QID) | ORAL | 0 refills | Status: DC | PRN

## 2020-10-22 MED ORDER — PANTOPRAZOLE SODIUM 40 MG PO TBEC: 40.0000 mg | DELAYED_RELEASE_TABLET | Freq: Every day | ORAL | 0 refills | Status: DC

## 2020-10-22 MED ORDER — METHYLPHENIDATE HCL 5 MG PO TABS
5.0000 mg | ORAL_TABLET | Freq: Two times a day (BID) | ORAL | 0 refills | Status: DC
Start: 2020-10-22 — End: 2020-12-11

## 2020-10-22 MED ORDER — TRAZODONE HCL 50 MG PO TABS: 50.0000 mg | ORAL_TABLET | Freq: Every day | ORAL | 0 refills | Status: DC

## 2020-10-22 MED ORDER — LEVETIRACETAM 500 MG PO TABS: 500.0000 mg | ORAL_TABLET | Freq: Two times a day (BID) | ORAL | 0 refills | Status: DC

## 2020-10-22 MED ORDER — ATORVASTATIN CALCIUM 20 MG PO TABS
20.0000 mg | ORAL_TABLET | Freq: Every day | ORAL | 0 refills | Status: DC
Start: 2020-10-23 — End: 2023-08-25

## 2020-10-22 MED ORDER — TOPIRAMATE 25 MG PO TABS
25.0000 mg | ORAL_TABLET | Freq: Every day | ORAL | 0 refills | Status: DC
Start: 2020-10-22 — End: 2020-12-10

## 2020-10-22 MED ORDER — SENNOSIDES-DOCUSATE SODIUM 8.6-50 MG PO TABS
2.0000 | ORAL_TABLET | Freq: Two times a day (BID) | ORAL | 0 refills | Status: DC
Start: 2020-10-22 — End: 2021-04-23

## 2020-10-22 MED ORDER — ACETAMINOPHEN 325 MG PO TABS: 325.0000 mg | ORAL_TABLET | ORAL | Status: AC | PRN

## 2020-10-22 MED ORDER — CARBIDOPA-LEVODOPA 25-100 MG PO TABS: 1.0000 | ORAL_TABLET | Freq: Three times a day (TID) | ORAL | 0 refills | Status: DC

## 2020-10-22 MED ORDER — TAMSULOSIN HCL 0.4 MG PO CAPS
0.8000 mg | ORAL_CAPSULE | Freq: Every day | ORAL | 0 refills | Status: DC
Start: 2020-10-22 — End: 2023-08-25

## 2020-10-22 MED ORDER — METOPROLOL SUCCINATE ER 25 MG PO TB24
25.0000 mg | ORAL_TABLET | Freq: Every evening | ORAL | 0 refills | Status: DC
Start: 2020-10-22 — End: 2020-11-19

## 2020-10-22 NOTE — Progress Notes (Signed)
Patient discharge with family. Discharge instructions reviewed by PA with patient and family.
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
## 2020-10-22 NOTE — Progress Notes (Signed)
[name], male   DOB: 1951-03-09, 70 y.o.   MRN: [id number]  SW met with pt, pt brother Mike Thomas pt sister Mike Thomas in room to update on HHA challenges. SW informed will continue to work on [facility], and will follow-up once established.   1512: SW received updates from Mike Thomas/[facility] able to accept referral for HHPT/OT/SLP/aid/SW. SW requested they call his brother Mike Thomas if before 10/25/20 to schedule home visit.   8676: SW spoke with pt brother Mike Thomas [phone]) to update on HHA info above. SW provided contact information for HHA.   Mike Thomas, MSW, [place] Office: [phone] Cell: [phone] Fax: [phone]

## 2020-10-22 NOTE — Progress Notes (Signed)
Inpatient Rehabilitation Care Coordinator Discharge Note  The overall goal for the admission was met for:   Discharge location: Yes. Pt discharged to his sister [name]'s home who can only provide supervision level of care.  Length of Stay: Yes. 18 days.   Discharge activity level: Yes. Supervision.   Home/community participation: Yes. Limited.   Services provided included: MD, RD, PT, OT, SLP, RN, CM, TR, Pharmacy, Neuropsych and SW  Financial Services: Other: [employer]  Choices offered to/list presented to:Yes  Follow-up services arranged: Home Health: [facility] for HHPT/OT/SLP/aide/SW and DME: No DME recommended  Comments (or additional information):  Patient/Family verbalized understanding of follow-up arrangements: Yes  Individual responsible for coordination of the follow-up plan: contact pt sister [name] [phone] or pt [phone]  Confirmed correct DME delivered: Rana Snare 10/22/2020    Rana Snare
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
## 2020-10-22 NOTE — Discharge Instructions (Signed)
Inpatient Rehab Discharge Instructions  Jayvien Rowlette Discharge date and time:    Activities/Precautions/ Functional Status: Activity: no lifting, driving, or strenuous exercise  till cleared by MD Diet: low fat, low cholesterol diet Wound Care: keep wound clean and dry    Functional status:  ___ No restrictions     ___ Walk up steps independently _X__ 24/7 supervision/assistance   ___ Walk up steps with assistance ___ Intermittent supervision/assistance  ___ Bathe/dress independently ___ Walk with walker     ___ Bathe/dress with assistance ___ Walk Independently    ___ Shower independently ___ Walk with assistance    _X__ Shower with assistance _X__ No alcohol     ___ Return to work/school ________  Special Instructions:    My questions have been answered and I understand these instructions. I will adhere to these goals and the provided educational materials after my discharge from the hospital.  Patient/Caregiver Signature _______________________________ Date __________  Clinician Signature _______________________________________ Date __________  Please bring this form and your medication list with you to all your follow-up doctor's appointments.

## 2020-10-30 ENCOUNTER — Ambulatory Visit: Payer: Medicare HMO | Admitting: Student

## 2020-10-30 ENCOUNTER — Other Ambulatory Visit: Payer: Self-pay

## 2020-10-30 ENCOUNTER — Telehealth: Payer: Self-pay | Admitting: *Deleted

## 2020-10-30 ENCOUNTER — Encounter: Payer: Self-pay | Admitting: Student

## 2020-10-30 VITALS — BP 93/66 | HR 60 | Temp 97.7°F | Ht 72.0 in | Wt 176.0 lb

## 2020-10-30 DIAGNOSIS — Z5181 Encounter for therapeutic drug level monitoring: Secondary | ICD-10-CM

## 2020-10-30 DIAGNOSIS — I4819 Other persistent atrial fibrillation: Secondary | ICD-10-CM

## 2020-10-30 DIAGNOSIS — I5020 Unspecified systolic (congestive) heart failure: Secondary | ICD-10-CM

## 2020-10-30 NOTE — Progress Notes (Signed)
Primary Physician/Referring:  Dione Housekeeper, MD  [name], male    DOB: 11/25/1950, 70 y.o.   MRN: [id number]  Chief Complaint  Patient presents with  . Atrial Fibrillation  . Hospitalization Follow-up   HPI:    Mike Thomas  is a 70 y.o. Caucasian male with history of hypertension, hyperlipidemia, persistent atrial fibrillation and heart failure with reduced ejection fraction diagnosed 09/2020.  Patient also has history of low flow low gradient aortic stenosis.   Patient was admitted to [facility] 09/25/2020-10/03/2020 and underwent bilateral craniotomy for subdural hematoma evacuation.  During hospitalization patient found to be in atrial fibrillation with RVR and stabilized with rate control strategy.  Patient was discharged with amiodarone 20 mg daily, digoxin 0.125 mg daily, metoprolol succinate 25 mg.  Also during hospitalization patient noted to have LVEF of 25 to 30%, suspicious for nonischemic cardiomyopathy in the setting of subdural hematoma.  Notably patient was not discharged on ACE or ARB in view of low blood pressure.  Following inpatient mission patient was transferred to rehabilitation facility for recovery.  Patient now presents for follow-up and management of atrial fibrillation, hyperlipidemia, and hypertension, as well as aortic stenosis.  Patient reports since discharge he has been doing relatively well, continuing with home PT and OT.  He does continue to have memory deficits but overall is without specific complaints today.  He has not yet followed up with neurosurgery or his PCP.  Patient denies chest pain, palpitations, dizziness, syncope, near syncope.  Patient denies orthopnea, PND, leg swelling.   Past Medical History:  Diagnosis Date  . AKI (acute kidney injury) ([place])   . Allergy   . Headache   . Hx of migraines   . Hyperlipidemia   . Hypertension    Past Surgical History:  Procedure Laterality Date  . CRANIOTOMY Bilateral 09/27/2020    Procedure: BILATERAL CRANIOTOMY FOR SUBDURAL HEMATOMA EVACUATION;  Surgeon: Eustace Moore, MD;  Location: [place];  Service: Neurosurgery;  Laterality: Bilateral;  . KNEE SURGERY Left 1971  . TONSILLECTOMY     Family History  Problem Relation Age of Onset  . Cancer Mother 28  . Colon cancer Mother 51       died age 46  . Cancer Father 17       leukemia  . Cancer Sister        breast  . Diabetes Brother   . Cancer Brother 60       prostate  . Stomach cancer Brother        thinks dx age 7  . Rectal cancer Neg Hx   . Prostate cancer Neg Hx   . Esophageal cancer Neg Hx   . Liver cancer Neg Hx   . Pancreatic cancer Neg Hx     Social History   Tobacco Use  . Smoking status: Current Every Day Smoker    Packs/day: 0.50    Years: 30.00    Pack years: 15.00    Types: Cigarettes  . Smokeless tobacco: Never Used  Substance Use Topics  . Alcohol use: Yes    Alcohol/week: 20.0 standard drinks    Types: 20 Glasses of wine per week   Marital Status: Divorced   ROS  Review of Systems  Constitutional: Negative for malaise/fatigue and weight gain.  Cardiovascular: Negative for chest pain, claudication, leg swelling, near-syncope, orthopnea, palpitations, paroxysmal nocturnal dyspnea and syncope.  Respiratory: Negative for shortness of breath.   Hematologic/Lymphatic: Does not bruise/bleed easily.  Gastrointestinal: Negative for melena.  Neurological: Negative for dizziness and weakness.  Psychiatric/Behavioral: Positive for memory loss.    Objective  Blood pressure 93/66, pulse 60, temperature 97.7 F (36.5 C), height 6' (1.829 m), weight 176 lb (79.8 kg), SpO2 96 %.  Vitals with BMI 10/30/2020 10/22/2020 10/21/2020  Height 6\' 0"  - -  Weight 176 lbs - -  BMI 99.83 - -  Systolic 93 92 89  Diastolic 66 54 63  Pulse 60 54 51      Physical Exam Vitals reviewed.  HENT:     Head: Normocephalic and atraumatic.  Eyes:     Comments: Corneal arcus  Cardiovascular:     Rate and  Rhythm: Normal rate. Rhythm irregular.     Pulses: Intact distal pulses.     Heart sounds: S1 normal and S2 normal. No murmur heard. No gallop.   Pulmonary:     Effort: Pulmonary effort is normal. No respiratory distress.     Breath sounds: No wheezing, rhonchi or rales.  Abdominal:     General: Bowel sounds are normal. There is no distension.     Palpations: Abdomen is soft.  Musculoskeletal:     Right lower leg: No edema.     Left lower leg: No edema.  Neurological:     Mental Status: He is alert.     Laboratory examination:   Recent Labs    10/16/20 0448 10/18/20 0505 10/21/20 0743  NA 132* 133* 134*  K 4.4 4.7 4.0  CL 98 101 102  CO2 28 25 26   GLUCOSE 112* 106* 150*  BUN 19 20 16   CREATININE 1.24 1.31* 1.37*  CALCIUM 9.0 9.1 9.1  GFRNONAA >60 59* 56*   estimated creatinine clearance is 56.6 mL/min (A) (by C-G formula based on SCr of 1.37 mg/dL (H)).  CMP Latest Ref Rng & Units 10/21/2020 10/18/2020 10/16/2020  Glucose 70 - 99 mg/dL 150(H) 106(H) 112(H)  BUN 8 - 23 mg/dL 16 20 19   Creatinine 0.61 - 1.24 mg/dL 1.37(H) 1.31(H) 1.24  Sodium 135 - 145 mmol/L 134(L) 133(L) 132(L)  Potassium 3.5 - 5.1 mmol/L 4.0 4.7 4.4  Chloride 98 - 111 mmol/L 102 101 98  CO2 22 - 32 mmol/L 26 25 28   Calcium 8.9 - 10.3 mg/dL 9.1 9.1 9.0  Total Protein 6.5 - 8.1 g/dL - - -  Total Bilirubin 0.3 - 1.2 mg/dL - - -  Alkaline Phos 38 - 126 U/L - - -  AST 15 - 41 U/L - - -  ALT 0 - 44 U/L - - -   CBC Latest Ref Rng & Units 10/21/2020 10/13/2020 10/07/2020  WBC 4.0 - 10.5 K/uL 4.3 7.9 8.2  Hemoglobin 13.0 - 17.0 g/dL 15.4 15.0 15.3  Hematocrit 39.0 - 52.0 % 46.6 42.6 43.4  Platelets 150 - 400 K/uL 151 185 167    Lipid Panel No results for input(s): CHOL, TRIG, LDLCALC, VLDL, HDL, CHOLHDL, LDLDIRECT in the last 8760 hours.  HEMOGLOBIN A1C No results found for: HGBA1C, MPG TSH Recent Labs    09/25/20 1833  TSH 1.660    External labs:  None   Medications and allergies  No Known  Allergies   Outpatient Medications Prior to Visit  Medication Sig Dispense Refill  . acetaminophen (TYLENOL) 325 MG tablet Take 1-2 tablets (325-650 mg total) by mouth every 4 (four) hours as needed for mild pain.    Marland Kitchen amiodarone (PACERONE) 200 MG tablet Take 1 tablet (200 mg total) by mouth  daily. 30 tablet 1  . atorvastatin (LIPITOR) 20 MG tablet Take 1 tablet (20 mg total) by mouth daily. 30 tablet 0  . carbidopa-levodopa (SINEMET IR) 25-100 MG tablet Take 1 tablet by mouth 3 (three) times daily. 90 tablet 0  . digoxin (LANOXIN) 0.125 MG tablet Take 1 tablet (0.125 mg total) by mouth daily. 30 tablet 0  . levETIRAcetam (KEPPRA) 500 MG tablet Take 1 tablet (500 mg total) by mouth 2 (two) times daily. 60 tablet 0  . methylphenidate (RITALIN) 5 MG tablet Take 1 tablet (5 mg total) by mouth 2 (two) times daily with breakfast and lunch. 60 tablet 0  . metoprolol succinate (TOPROL-XL) 25 MG 24 hr tablet Take 1 tablet (25 mg total) by mouth every evening. 30 tablet 0  . oxyCODONE (OXY IR/ROXICODONE) 5 MG immediate release tablet Take 1-2 tablets (5-10 mg total) by mouth every 6 (six) hours as needed for severe pain. 28 tablet 0  . pantoprazole (PROTONIX) 40 MG tablet Take 1 tablet (40 mg total) by mouth at bedtime. 30 tablet 0  . senna-docusate (SENOKOT-S) 8.6-50 MG tablet Take 2 tablets by mouth 2 (two) times daily. 120 tablet 0  . tamsulosin (FLOMAX) 0.4 MG CAPS capsule Take 2 capsules (0.8 mg total) by mouth daily after supper. 60 capsule 0  . topiramate (TOPAMAX) 25 MG tablet Take 1 tablet (25 mg total) by mouth at bedtime. 30 tablet 0  . traZODone (DESYREL) 50 MG tablet Take 1 tablet (50 mg total) by mouth at bedtime. 30 tablet 0  . furosemide (LASIX) 20 MG tablet Take 20 mg by mouth.    . hydrochlorothiazide (HYDRODIURIL) 25 MG tablet Take 25 mg by mouth daily.    . potassium chloride (KLOR-CON) 20 MEQ packet Take by mouth 2 (two) times daily.    Marland Kitchen amiodarone (PACERONE) 200 MG tablet TAKE 1  TABLET (200 MG TOTAL) BY MOUTH DAILY. 30 tablet 1   No facility-administered medications prior to visit.     Radiology:   No results found.  CT head without contrast 09/28/2020: Brain: Interval bilateral subdural hematoma evacuation with drain placement on both sides. The right-sided drain is flexed and closely applied to the brain without adjacent brain edema or parenchymal hemorrhage.  The collections have increased since due to gas, measuring up to 3 cm in thickness and causing peaking of the frontal lobes. The degree of high-density blood products is diminished. Gel-Foam is seen beneath the bilateral craniotomy flaps which do not extend to any of the sinuses.  No complicating infarct, hydrocephalus, or herniation.  Vascular: Atheromatous calcification  Skull: Unremarkable bilateral craniotomy.  Sinuses/Orbits: Venous gas seen at the skull base.  Other: Case discussed with RN Raquel Sarna and the patient is doing well. A call has been placed to the neuro surgery team.  IMPRESSION: 1. Bilateral subdural hematoma evacuation with prominent degree of pneumocephalus causing marks effect on the frontal lobes, please correlate for clinical signs of tension. 2. Kinking of the right subdural catheter with tip invested in right frontal sulci  Cardiac Studies:   Echocardiogram 09/26/2020: 1. Mildly dilated left ventricule with normal wall thickness. Severe  global hypokinesis, LVEF 25-Left ventricular ejection fraction, by  estimation, is 25 to 30%. No LV thrombus seen. Left ventricular diastolic  parameters are indeterminate due to atrial  fibrillation.  2. Right ventricular systolic function is low normal. The right  ventricular size is normal.  3. Left atrial size was mildly dilated.  4. Right atrial size was mildly dilated.  5. The mitral valve is grossly normal. Mild mitral valve regurgitation.  6. The aortic valve is abnormal. Aortic valve regurgitation is not   visualized. Aortic valve area, by VTI measures 0.92 cm. Aortic valve Vmax  measures 2.08 m/s.  Likely bicuspid aortic valve with severe calcification with low flow low  gradient aortic stenosis.  EKG:   10/30/2020: Atrial fibrillation with controlled ventricular response at a rate of 61 bpm.  Left axis, left anterior fascicular block.  Poor R wave progression, cannot exclude anteroseptal infarct old.  IVCD.  EKG3/08/2020: A. fib with RVR Possible old anterior infarct, poor R wave progression Long QT interval  Assessment     ICD-10-CM   1. Persistent atrial fibrillation (HCC)  I48.19 EKG 12-Lead    CANCELED: EKG 12-Lead  2. Therapeutic drug monitoring  Z51.81 Digoxin level  3. HFrEF (heart failure with reduced ejection fraction) ([place])  I50.20      Medications Discontinued During This Encounter  Medication Reason  . amiodarone (PACERONE) 335 MG tablet Duplicate    No orders of the defined types were placed in this encounter.  This patients CHA2DS2-VASc Score 3 (CHF, HTN, Age) and yearly risk of stroke 3.2%.   Recommendations:   Lional Icenogle is a 69 y.o. Caucasian male with history of hypertension, hyperlipidemia, persistent atrial fibrillation and heart failure with reduced ejection fraction diagnosed 09/2020.  Patient also has history of low flow low gradient aortic stenosis.   Patient was admitted to [facility] 09/25/2020-10/03/2020 and underwent bilateral craniotomy for subdural hematoma evacuation.  During hospitalization patient found to be in atrial fibrillation with RVR and stabilized with rate control strategy.  Patient was discharged with amiodarone 20 mg daily, digoxin 0.125 mg daily, metoprolol succinate 25 mg.  Also during hospitalization patient noted to have LVEF of 25 to 30%, suspicious for nonischemic cardiomyopathy in the setting of subdural hematoma.  Notably patient was not discharged on ACE or ARB in view of low blood pressure.  Following inpatient mission  patient was transferred to rehabilitation facility for recovery.  Patient now presents for follow-up and management of atrial fibrillation, hyperlipidemia, and hypertension, as well as aortic stenosis.  Patient's blood pressure remains soft, however he is asymptomatic.  Patient remains in atrial fibrillation by EKG today, however rate is well controlled.  Patient is not presently on anticoagulation despite CHA2DS2-VASc score of 3 in view of recent subdural hematoma.  Would recommend anticoagulation when deemed appropriate by neurosurgery.  I have advised patient to be sure to visual follow-up appointment with neurosurgery and PCP as he has not seen them since hospitalization.  At this time we will continue with rate control management for atrial fibrillation.  Reviewed and discussed with patient at length regarding his hospital stay and cardiovascular evaluation during said stay.  Discussed with him regarding significantly reduced LVEF of 25 to 30%.  Counseled him that I am suspicious for nonischemic cardiomyopathy, and that heart function may improve as he recovers from acute illness in the setting of subdural hematoma.  We will plan to repeat echocardiogram in approximately 3 months to reevaluate LVEF.  Patient does however have multiple cardiovascular risk factors and in view of new onset atrial fibrillation would recommend patient undergo ischemic evaluation.  Discussed with patient regarding options of proceeding with stress testing versus coronary CTA.  Patient prefers to do research and discuss with his family. Patient will let our office know of his choice.  At this time will not make changes to his cardiovascular  medications, continue amiodarone, atorvastatin, digoxin, metoprolol succinate. Notably patient is not sure of the medications he is taking and did not bring them with him today. I have asked the patient to call the office back to complete a medication reconciliation.   Follow up in 4 weeks.    During this visit I reviewed and updated: Tobacco history  allergies medication reconciliation  medical history  surgical history  family history  social history.  This note was created using a voice recognition software as a result there may be grammatical errors inadvertently enclosed that do not reflect the nature of this encounter. Every attempt is made to correct such errors.   Alethia Berthold, PA-C 11/08/2020, 1:38 PM Office: [phone]

## 2020-10-30 NOTE — Telephone Encounter (Signed)
Sharyn Lull OT from [facility] called for approval for POC 1wk5. Approval given.

## 2020-11-06 ENCOUNTER — Encounter: Payer: Medicare HMO | Admitting: Physical Medicine & Rehabilitation

## 2020-11-14 ENCOUNTER — Other Ambulatory Visit: Payer: Self-pay | Admitting: Physical Medicine and Rehabilitation

## 2020-11-19 ENCOUNTER — Other Ambulatory Visit: Payer: Self-pay

## 2020-11-19 MED ORDER — DIGOXIN 125 MCG PO TABS: 0.1250 mg | ORAL_TABLET | Freq: Every day | ORAL | 0 refills | Status: DC

## 2020-11-19 MED ORDER — METOPROLOL SUCCINATE ER 25 MG PO TB24: 25.0000 mg | ORAL_TABLET | Freq: Every evening | ORAL | 0 refills | Status: DC

## 2020-12-02 ENCOUNTER — Other Ambulatory Visit: Payer: Self-pay | Admitting: Student

## 2020-12-02 DIAGNOSIS — S065XAA Traumatic subdural hemorrhage with loss of consciousness status unknown, initial encounter: Secondary | ICD-10-CM

## 2020-12-02 DIAGNOSIS — S065X9A Traumatic subdural hemorrhage with loss of consciousness of unspecified duration, initial encounter: Secondary | ICD-10-CM

## 2020-12-11 ENCOUNTER — Encounter: Payer: Self-pay | Admitting: Physical Medicine & Rehabilitation

## 2020-12-11 ENCOUNTER — Encounter: Payer: Medicare HMO | Attending: Physical Medicine & Rehabilitation | Admitting: Physical Medicine & Rehabilitation

## 2020-12-11 ENCOUNTER — Other Ambulatory Visit: Payer: Self-pay

## 2020-12-11 VITALS — BP 133/88 | HR 82 | Temp 98.5°F | Ht 72.0 in | Wt 187.8 lb

## 2020-12-11 DIAGNOSIS — S065X9A Traumatic subdural hemorrhage with loss of consciousness of unspecified duration, initial encounter: Secondary | ICD-10-CM | POA: Diagnosis present

## 2020-12-11 DIAGNOSIS — R339 Retention of urine, unspecified: Secondary | ICD-10-CM | POA: Diagnosis present

## 2020-12-11 DIAGNOSIS — S065XAA Traumatic subdural hemorrhage with loss of consciousness status unknown, initial encounter: Secondary | ICD-10-CM
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
## 2020-12-11 NOTE — Patient Instructions (Addendum)
YOU MAY DRIVE LOCALLY WITHIN A RANGE OF 15 MINUTES FROM HOME  STOP TROKENDI   KEPPRA YOU CAN REDUCE TO 500MG  AT BEDTIME FOR ONE WEEK THEN STOP.    NO ALCOHOL.

## 2020-12-13 ENCOUNTER — Other Ambulatory Visit: Payer: Self-pay | Admitting: Physical Medicine and Rehabilitation

## 2020-12-13 ENCOUNTER — Other Ambulatory Visit: Payer: Self-pay | Admitting: Student

## 2021-01-14 ENCOUNTER — Other Ambulatory Visit: Payer: Medicare HMO

## 2021-01-18 ENCOUNTER — Other Ambulatory Visit: Payer: Medicare HMO

## 2021-02-06 ENCOUNTER — Other Ambulatory Visit: Payer: Self-pay | Admitting: Student

## 2021-02-06 ENCOUNTER — Other Ambulatory Visit: Payer: Self-pay | Admitting: Physical Medicine and Rehabilitation

## 2021-02-06 NOTE — Telephone Encounter (Signed)
He was supposed to come back in 4 weeks and now it is 3 months later. He does not have an appointment, Okay to refill, but make appointment to see [facility] ASAP

## 2021-02-06 NOTE — Telephone Encounter (Signed)
Okay to refill? 

## 2021-02-07 NOTE — Telephone Encounter (Signed)
Patient stated that his rehab told him he should not be driving for any longer than 15 minutes. He has been having an issue with transportation and will not know if he can find a ride until next week. He will call us to schedule it then. He wants to know how important this office visit is in comparison to his appointment with other doctors.

## 2021-02-12 ENCOUNTER — Other Ambulatory Visit: Payer: Medicare HMO

## 2021-03-26 ENCOUNTER — Ambulatory Visit: Payer: Medicare HMO | Admitting: Student

## 2021-04-01 ENCOUNTER — Ambulatory Visit: Payer: Medicare HMO | Admitting: Student

## 2021-04-11 ENCOUNTER — Other Ambulatory Visit: Payer: Self-pay | Admitting: Cardiology

## 2021-04-11 ENCOUNTER — Other Ambulatory Visit: Payer: Self-pay | Admitting: Student

## 2021-04-11 DIAGNOSIS — I4819 Other persistent atrial fibrillation: Secondary | ICD-10-CM

## 2021-04-11 NOTE — Telephone Encounter (Signed)
Refill request

## 2021-04-14 ENCOUNTER — Telehealth: Payer: Self-pay | Admitting: Physical Medicine & Rehabilitation

## 2021-04-14 NOTE — Telephone Encounter (Signed)
Patient called to reschedule his appt due to lack of transportation, Patient has questions about when he would be able to return to driving.  Also was asking if you need to see results of a more recent PET scan prior to seeing him?  Please advise.thanks.

## 2021-04-14 NOTE — Telephone Encounter (Signed)
I (or another doctor willing to do so) would need to see him to clear him to drive. I have not seen results of PET scan.

## 2021-04-16 ENCOUNTER — Encounter: Payer: Medicare HMO | Admitting: Physical Medicine & Rehabilitation

## 2021-04-21 NOTE — Progress Notes (Signed)
Primary Physician/Referring:  Mike Housekeeper, MD  [name], male    DOB: 26-Jul-1951, 70 y.o.   MRN: [id number]  Chief Complaint  Patient presents with   Atrial Fibrillation   Follow-up   HPI:    Mike Thomas  is a 70 y.o. Caucasian male with history of hypertension, hyperlipidemia, persistent atrial fibrillation and heart failure with reduced ejection fraction diagnosed 09/2020.  Patient also has history of low flow low gradient aortic stenosis.   Patient was admitted to [facility] 09/25/2020-10/03/2020 and underwent bilateral craniotomy for subdural hematoma evacuation.  During hospitalization patient found to be in atrial fibrillation with RVR and stabilized with rate control strategy.  Also during hospitalization patient noted to have LVEF of 25-30% suspicious for nonischemic cardiomyopathy in the setting of subdural hematoma.  At that time patient was advised to follow-up in 4 weeks, unfortunately he was lost to follow-up until now and repeat echocardiogram and ischemic evaluation have not been done.  Patient has unfortunately not followed up with neurosurgery regarding repeat CT scan of the brain done.  He reports he is feeling relatively well overall doing yard work without issue.  He does continue to have balance issues residual since his CVA.  He also admits to poor fluid intake.  Denies chest pain, palpitations, dyspnea, syncope, near syncope.  Denies orthopnea, PND, leg edema.    Patient was last seen in our office 10/30/2020 at which time recommended anticoagulation when deemed appropriate by neurosurgery and ordered repeat echocardiogram to reevaluate LVEF, also recommended ischemic evaluation.  At that time patient was advised to follow-up in 4 weeks  Notably patient was not discharged on ACE or ARB in view of low blood pressure.  Following inpatient mission patient was transferred to rehabilitation facility for recovery.  Patient now presents for follow-up  and management of atrial fibrillation, hyperlipidemia, and hypertension, as well as aortic stenosis.  Patient reports since discharge he has been doing relatively well, continuing with home PT and OT.  He does continue to have memory deficits but overall is without specific complaints today.  He has not yet followed up with neurosurgery or his PCP.  Patient denies chest pain, palpitations, dizziness, syncope, near syncope.  Patient denies orthopnea, PND, leg swelling.   Past Medical History:  Diagnosis Date   AKI (acute kidney injury) ([place])    Allergy    Headache    Hx of migraines    Hyperlipidemia    Hypertension    Past Surgical History:  Procedure Laterality Date   CRANIOTOMY Bilateral 09/27/2020   Procedure: BILATERAL CRANIOTOMY FOR SUBDURAL HEMATOMA EVACUATION;  Surgeon: Eustace Moore, MD;  Location: [place];  Service: Neurosurgery;  Laterality: Bilateral;   KNEE SURGERY Left 1971   TONSILLECTOMY     Family History  Problem Relation Age of Onset   Cancer Mother 56   Colon cancer Mother 85       died age 38   Cancer Father 58       leukemia   Cancer Sister        breast   Diabetes Brother    Cancer Brother 19       prostate   Stomach cancer Brother        thinks dx age 98   Rectal cancer Neg Hx    Prostate cancer Neg Hx    Esophageal cancer Neg Hx    Liver cancer Neg Hx    Pancreatic cancer Neg Hx  Social History   Tobacco Use   Smoking status: Every Day    Packs/day: 0.50    Years: 30.00    Pack years: 15.00    Types: Cigarettes   Smokeless tobacco: Never  Substance Use Topics   Alcohol use: Yes    Alcohol/week: 20.0 standard drinks    Types: 20 Glasses of wine per week   Marital Status: Divorced   ROS  Review of Systems  Constitutional: Negative for malaise/fatigue and weight gain.  Cardiovascular:  Negative for chest pain, claudication, leg swelling, near-syncope, orthopnea, palpitations, paroxysmal nocturnal dyspnea and syncope.  Respiratory:   Negative for shortness of breath.   Hematologic/Lymphatic: Does not bruise/bleed easily.  Gastrointestinal:  Negative for melena.  Neurological:  Negative for dizziness and weakness.  Psychiatric/Behavioral:  Positive for memory loss.    Objective  Blood pressure 122/86, pulse (!) 56, temperature 97.6 F (36.4 C), temperature source Temporal, resp. rate 16, height 6' (1.829 m), weight 187 lb (84.8 kg), SpO2 98 %.  Vitals with BMI 04/23/2021 04/23/2021 12/11/2020  Height - 6\' 0"  6\' 0"   Weight - 187 lbs 187 lbs 13 oz  BMI - 22.02 54.27  Systolic 062 376 283  Diastolic 86 88 88  Pulse 56 62 82      Physical Exam Vitals reviewed.  HENT:     Head: Normocephalic and atraumatic.  Eyes:     Comments: Corneal arcus  Cardiovascular:     Rate and Rhythm: Normal rate. Rhythm irregular.     Pulses: Intact distal pulses.     Heart sounds: S1 normal and S2 normal. No murmur heard.   No gallop.  Pulmonary:     Effort: Pulmonary effort is normal. No respiratory distress.     Breath sounds: No wheezing, rhonchi or rales.  Abdominal:     General: Bowel sounds are normal. There is no distension.     Palpations: Abdomen is soft.  Musculoskeletal:     Right lower leg: No edema.     Left lower leg: No edema.  Neurological:     Mental Status: He is alert.    Laboratory examination:   Recent Labs    10/16/20 0448 10/18/20 0505 10/21/20 0743  NA 132* 133* 134*  K 4.4 4.7 4.0  CL 98 101 102  CO2 28 25 26   GLUCOSE 112* 106* 150*  BUN 19 20 16   CREATININE 1.24 1.31* 1.37*  CALCIUM 9.0 9.1 9.1  GFRNONAA >60 59* 56*   CrCl cannot be calculated (Patient's most recent lab result is older than the maximum 21 days allowed.).  CMP Latest Ref Rng & Units 10/21/2020 10/18/2020 10/16/2020  Glucose 70 - 99 mg/dL 150(H) 106(H) 112(H)  BUN 8 - 23 mg/dL 16 20 19   Creatinine 0.61 - 1.24 mg/dL 1.37(H) 1.31(H) 1.24  Sodium 135 - 145 mmol/L 134(L) 133(L) 132(L)  Potassium 3.5 - 5.1 mmol/L 4.0 4.7 4.4   Chloride 98 - 111 mmol/L 102 101 98  CO2 22 - 32 mmol/L 26 25 28   Calcium 8.9 - 10.3 mg/dL 9.1 9.1 9.0  Total Protein 6.5 - 8.1 g/dL - - -  Total Bilirubin 0.3 - 1.2 mg/dL - - -  Alkaline Phos 38 - 126 U/L - - -  AST 15 - 41 U/L - - -  ALT 0 - 44 U/L - - -   CBC Latest Ref Rng & Units 10/21/2020 10/13/2020 10/07/2020  WBC 4.0 - 10.5 K/uL 4.3 7.9 8.2  Hemoglobin 13.0 - 17.0  g/dL 15.4 15.0 15.3  Hematocrit 39.0 - 52.0 % 46.6 42.6 43.4  Platelets 150 - 400 K/uL 151 185 167    Lipid Panel No results for input(s): CHOL, TRIG, LDLCALC, VLDL, HDL, CHOLHDL, LDLDIRECT in the last 8760 hours.  HEMOGLOBIN A1C No results found for: HGBA1C, MPG TSH Recent Labs    09/25/20 1833  TSH 1.660    External labs:  None   Allergies  No Known Allergies   Medications Prior to Visit:   Outpatient Medications Prior to Visit  Medication Sig Dispense Refill   acetaminophen (TYLENOL) 325 MG tablet Take 1-2 tablets (325-650 mg total) by mouth every 4 (four) hours as needed for mild pain.     amiodarone (PACERONE) 200 MG tablet TAKE 1 TABLET DAILY 30 tablet 0   atorvastatin (LIPITOR) 20 MG tablet Take 1 tablet (20 mg total) by mouth daily. 30 tablet 0   digoxin (LANOXIN) 0.125 MG tablet TAKE ONE TABLET ONCE DAILY 90 tablet 0   levETIRAcetam (KEPPRA) 500 MG tablet Take 1 tablet (500 mg total) by mouth 2 (two) times daily. 60 tablet 0   metoprolol succinate (TOPROL-XL) 25 MG 24 hr tablet TAKE ONE TABLET IN THE EVENING 90 tablet 0   pantoprazole (PROTONIX) 40 MG tablet Take 1 tablet (40 mg total) by mouth at bedtime. 30 tablet 0   tamsulosin (FLOMAX) 0.4 MG CAPS capsule Take 2 capsules (0.8 mg total) by mouth daily after supper. 60 capsule 0   Topiramate ER (TROKENDI XR) 25 MG CP24      traZODone (DESYREL) 100 MG tablet      atorvastatin (LIPITOR) 20 MG tablet Take 1 tablet by mouth daily.     methylphenidate (DAYTRANA) 10 mg/9hr patch  (Patient not taking: Reported on 12/11/2020)     oxyCODONE (OXY  IR/ROXICODONE) 5 MG immediate release tablet Take 1-2 tablets (5-10 mg total) by mouth every 6 (six) hours as needed for severe pain. (Patient not taking: Reported on 12/11/2020) 28 tablet 0   rosuvastatin (CRESTOR) 20 MG tablet Take 20 mg by mouth daily.     senna-docusate (SENOKOT-S) 8.6-50 MG tablet Take 2 tablets by mouth 2 (two) times daily. 120 tablet 0   No facility-administered medications prior to visit.   Final Medications at End of Visit    Current Meds  Medication Sig   acetaminophen (TYLENOL) 325 MG tablet Take 1-2 tablets (325-650 mg total) by mouth every 4 (four) hours as needed for mild pain.   amiodarone (PACERONE) 200 MG tablet TAKE 1 TABLET DAILY   atorvastatin (LIPITOR) 20 MG tablet Take 1 tablet (20 mg total) by mouth daily.   digoxin (LANOXIN) 0.125 MG tablet TAKE ONE TABLET ONCE DAILY   levETIRAcetam (KEPPRA) 500 MG tablet Take 1 tablet (500 mg total) by mouth 2 (two) times daily.   metoprolol succinate (TOPROL-XL) 25 MG 24 hr tablet TAKE ONE TABLET IN THE EVENING   pantoprazole (PROTONIX) 40 MG tablet Take 1 tablet (40 mg total) by mouth at bedtime.   tamsulosin (FLOMAX) 0.4 MG CAPS capsule Take 2 capsules (0.8 mg total) by mouth daily after supper.   Topiramate ER (TROKENDI XR) 25 MG CP24    traZODone (DESYREL) 100 MG tablet    [DISCONTINUED] atorvastatin (LIPITOR) 20 MG tablet Take 1 tablet by mouth daily.   Radiology:   No results found.  CT head without contrast 09/28/2020: Brain: Interval bilateral subdural hematoma evacuation with drain placement on both sides. The right-sided drain is flexed and closely applied  to the brain without adjacent brain edema or parenchymal hemorrhage.   The collections have increased since due to gas, measuring up to 3 cm in thickness and causing peaking of the frontal lobes. The degree of high-density blood products is diminished. Gel-Foam is seen beneath the bilateral craniotomy flaps which do not extend to any of the  sinuses.   No complicating infarct, hydrocephalus, or herniation.   Vascular: Atheromatous calcification   Skull: Unremarkable bilateral craniotomy.   Sinuses/Orbits: Venous gas seen at the skull base.   Other: Case discussed with RN Raquel Sarna and the patient is doing well. A call has been placed to the neuro surgery team.   IMPRESSION: 1. Bilateral subdural hematoma evacuation with prominent degree of pneumocephalus causing marks effect on the frontal lobes, please correlate for clinical signs of tension. 2. Kinking of the right subdural catheter with tip invested in right frontal sulci  Cardiac Studies:   Echocardiogram 09/26/2020: 1. Mildly dilated left ventricule with normal wall thickness. Severe  global hypokinesis, LVEF 25-Left ventricular ejection fraction, by  estimation, is 25 to 30%. No LV thrombus seen. Left ventricular diastolic  parameters are indeterminate due to atrial  fibrillation.   2. Right ventricular systolic function is low normal. The right  ventricular size is normal.   3. Left atrial size was mildly dilated.   4. Right atrial size was mildly dilated.   5. The mitral valve is grossly normal. Mild mitral valve regurgitation.   6. The aortic valve is abnormal. Aortic valve regurgitation is not  visualized. Aortic valve area, by VTI measures 0.92 cm. Aortic valve Vmax  measures 2.08 m/s.  Likely bicuspid aortic valve with severe calcification with low flow low  gradient aortic stenosis.  EKG:  04/23/2021: Atrial fibrillation with controlled ventricular response at a rate of 50 bpm.  Left axis, left anterior fascicular block.  Incomplete right bundle branch block.  Cannot exclude anteroseptal infarct old.  IVCD.  10/30/2020: Atrial fibrillation with controlled ventricular response at a rate of 61 bpm.  Left axis, left anterior fascicular block.  Poor R wave progression, cannot exclude anteroseptal infarct old.  IVCD.  EKG 09/25/2020: A. fib with RVR Possible  old anterior infarct, poor R wave progression Long QT interval  Assessment     ICD-10-CM   1. Persistent atrial fibrillation (HCC)  I48.19 EKG 12-Lead    PCV ECHOCARDIOGRAM COMPLETE    PCV MYOCARDIAL PERFUSION WITH LEXISCAN    2. HFrEF (heart failure with reduced ejection fraction) (HCC)  I50.20 PCV ECHOCARDIOGRAM COMPLETE    PCV MYOCARDIAL PERFUSION WITH LEXISCAN       Medications Discontinued During This Encounter  Medication Reason   methylphenidate (DAYTRANA) 10 mg/9hr patch Error   oxyCODONE (OXY IR/ROXICODONE) 5 MG immediate release tablet Error   rosuvastatin (CRESTOR) 20 MG tablet Error   senna-docusate (SENOKOT-S) 8.6-50 MG tablet Error   atorvastatin (LIPITOR) 20 MG tablet Error    No orders of the defined types were placed in this encounter.  This patients CHA2DS2-VASc Score 3 (CHF, HTN, Age) and yearly risk of stroke 3.2%.   Recommendations:   Bretton Tandy is a 70 y.o. Caucasian male with history of hypertension, hyperlipidemia, persistent atrial fibrillation and heart failure with reduced ejection fraction diagnosed 09/2020.  Patient also has history of low flow low gradient aortic stenosis.   Patient was admitted to [facility] 09/25/2020-10/03/2020 and underwent bilateral craniotomy for subdural hematoma evacuation.  During hospitalization patient found to be in atrial fibrillation with RVR  and stabilized with rate control strategy.  Also during hospitalization patient noted to have LVEF of 25-30% suspicious for nonischemic cardiomyopathy in the setting of subdural hematoma.  At that time patient was advised to follow-up in 4 weeks, unfortunately he was lost to follow-up until now and repeat echocardiogram and ischemic evaluation have not been done.  Patient has also been lost to follow-up with neurosurgery, therefore repeat CT scan of the head has not been done.  Given relatively recent subdural hematomas and no repeat follow-up we will hold off on initiation of  anticoagulation until it can be approved by neurosurgery.  Discussed at length with patient regarding the importance of repeat CT scan as well as follow-up with neurosurgery, understanding agreement.  Regard to atrial fibrillation, patient is presently well rate controlled therefore we will continue present medications.  Will obtain echocardiogram and nuclear stress test, however patient is not a treadmill candidate given balance issues following subdural hematomas.  Can consider discontinuation of digoxin following repeat echocardiogram.  Patient agrees to follow-up with neurosurgery as well as physical medicine/rehab and PCP accordingly.  Will not make changes to his medications at this time.  Patient has been evaluated for [facility] HF clinical trial.  Follow-up in 8 weeks, sooner if needed, for atrial fibrillation, heart failure, results of cardiac testing.   Alethia Berthold, PA-C 04/23/2021, 2:03 PM Office: [phone]

## 2021-04-23 ENCOUNTER — Other Ambulatory Visit: Payer: Self-pay

## 2021-04-23 ENCOUNTER — Ambulatory Visit: Payer: Medicare HMO | Admitting: Student

## 2021-04-23 ENCOUNTER — Encounter: Payer: Self-pay | Admitting: Student

## 2021-04-23 VITALS — BP 122/86 | HR 56 | Temp 97.6°F | Resp 16 | Ht 72.0 in | Wt 187.0 lb

## 2021-04-23 DIAGNOSIS — I4819 Other persistent atrial fibrillation: Secondary | ICD-10-CM

## 2021-04-23 DIAGNOSIS — I5020 Unspecified systolic (congestive) heart failure: Secondary | ICD-10-CM

## 2021-05-07 ENCOUNTER — Encounter: Payer: Medicare HMO | Admitting: Physical Medicine & Rehabilitation

## 2021-05-15 ENCOUNTER — Other Ambulatory Visit: Payer: Self-pay | Admitting: Cardiology

## 2021-05-15 DIAGNOSIS — I4819 Other persistent atrial fibrillation: Secondary | ICD-10-CM

## 2021-05-16 NOTE — Telephone Encounter (Signed)
Refill request

## 2021-05-28 ENCOUNTER — Ambulatory Visit: Payer: Medicare HMO

## 2021-05-28 ENCOUNTER — Other Ambulatory Visit: Payer: Self-pay

## 2021-05-28 DIAGNOSIS — I5020 Unspecified systolic (congestive) heart failure: Secondary | ICD-10-CM

## 2021-05-28 DIAGNOSIS — I4819 Other persistent atrial fibrillation: Secondary | ICD-10-CM

## 2021-05-29 LAB — PCV MYOCARDIAL PERFUSION WITH LEXISCAN
Angina Index: 0
ST Depression (mm): 0 mm

## 2021-06-03 NOTE — Progress Notes (Signed)
Called pt to inform him about his stress test. Pt was transformed to the front to schedule an appt in the next 2 weeks
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
## 2021-06-17 ENCOUNTER — Ambulatory Visit: Payer: Medicare HMO | Admitting: Student

## 2021-06-17 ENCOUNTER — Encounter: Payer: Self-pay | Admitting: Student

## 2021-06-17 ENCOUNTER — Other Ambulatory Visit: Payer: Self-pay

## 2021-06-17 DIAGNOSIS — I4819 Other persistent atrial fibrillation: Secondary | ICD-10-CM

## 2021-06-17 DIAGNOSIS — I5020 Unspecified systolic (congestive) heart failure: Secondary | ICD-10-CM

## 2021-06-18 ENCOUNTER — Ambulatory Visit: Payer: Medicare HMO | Admitting: Physical Medicine & Rehabilitation

## 2021-06-23 ENCOUNTER — Ambulatory Visit: Payer: Medicare HMO

## 2021-06-23 ENCOUNTER — Ambulatory Visit: Payer: Medicare HMO | Admitting: Student

## 2021-06-23 ENCOUNTER — Other Ambulatory Visit: Payer: Self-pay

## 2021-06-23 ENCOUNTER — Encounter: Payer: Self-pay | Admitting: Student

## 2021-06-23 VITALS — BP 122/80 | HR 56 | Temp 98.2°F | Ht 72.0 in | Wt 190.0 lb

## 2021-06-23 DIAGNOSIS — I5020 Unspecified systolic (congestive) heart failure: Secondary | ICD-10-CM

## 2021-06-23 DIAGNOSIS — I4819 Other persistent atrial fibrillation: Secondary | ICD-10-CM
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
## 2021-06-24 NOTE — Progress Notes (Signed)
Pumping activity has improved since last office visit.  We will discuss results further at upcoming office visit.

## 2021-06-24 NOTE — Progress Notes (Signed)
Called and spoke with patient regarding his echocardiogram results.  ?

## 2021-07-03 ENCOUNTER — Telehealth: Payer: Self-pay

## 2021-07-03 NOTE — Telephone Encounter (Signed)
FYI patient called to reschedule his upcoming cath. He said he had too much going on

## 2021-07-16 ENCOUNTER — Encounter: Payer: Self-pay | Admitting: Physical Medicine & Rehabilitation

## 2021-07-16 ENCOUNTER — Other Ambulatory Visit: Payer: Self-pay

## 2021-07-16 ENCOUNTER — Encounter: Payer: Medicare HMO | Attending: Physical Medicine & Rehabilitation | Admitting: Physical Medicine & Rehabilitation

## 2021-07-16 VITALS — BP 137/83 | HR 48 | Temp 97.9°F | Ht <= 58 in | Wt 190.0 lb

## 2021-07-16 DIAGNOSIS — G441 Vascular headache, not elsewhere classified: Secondary | ICD-10-CM | POA: Insufficient documentation

## 2021-07-16 DIAGNOSIS — I4891 Unspecified atrial fibrillation: Secondary | ICD-10-CM | POA: Insufficient documentation

## 2021-07-16 DIAGNOSIS — S065XAA Traumatic subdural hemorrhage with loss of consciousness status unknown, initial encounter: Secondary | ICD-10-CM | POA: Insufficient documentation

## 2021-07-16 NOTE — Patient Instructions (Addendum)
KEPPRA (LEVETIRACETAM)---Seizure medicine  TAKE AT BEDTIME FOR ONE WEEK THEN STOP.    YOU MAY ALSO STOP TOPIRAMATE
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
## 2021-07-24 ENCOUNTER — Ambulatory Visit: Payer: Medicare HMO | Admitting: Student

## 2021-08-12 ENCOUNTER — Encounter (HOSPITAL_COMMUNITY): Admission: RE | Payer: Self-pay | Source: Home / Self Care

## 2021-08-12 ENCOUNTER — Ambulatory Visit (HOSPITAL_COMMUNITY): Admission: RE | Admit: 2021-08-12 | Payer: Medicare HMO | Source: Home / Self Care | Admitting: Cardiology

## 2021-08-12 SURGERY — LEFT HEART CATH AND CORONARY ANGIOGRAPHY
Anesthesia: LOCAL

## 2021-08-14 ENCOUNTER — Other Ambulatory Visit: Payer: Self-pay | Admitting: Cardiology

## 2021-08-18 NOTE — Progress Notes (Signed)
I connected with the patient on 08/19/21 by a telephone call and verified that I am speaking with the correct person using two identifiers.     I offered the patient a video enabled application for a virtual visit. Unfortunately, this could not be accomplished due to technical difficulties/lack of video enabled phone/computer. I discussed the limitations of evaluation and management by telemedicine and the availability of in person appointments. The patient expressed understanding and agreed to proceed.   This visit type was conducted due to national recommendations for restrictions regarding the COVID-19 Pandemic (e.g. social distancing).  This format is felt to be most appropriate for this patient at this time.  All issues noted in this document were discussed and addressed.  No physical exam was performed (except for noted visual exam findings with Tele health visits).  The patient has consented to conduct a Tele health visit and understands insurance will be billed.   Primary Physician/Referring:  Tomasa Hose, NP  [name], male    DOB: 04/29/1951, 71 y.o.   MRN: [id number]  Chief Complaint  Patient presents with   Atrial Fibrillation   Follow-up   HPI:    Mike Thomas  is a 71 y.o. Caucasian male with history of hypertension, hyperlipidemia, persistent atrial fibrillation and heart failure with reduced ejection fraction diagnosed 09/2020.  Patient also has history of low flow low gradient aortic stenosis.   Patient was admitted to [facility] 09/25/2020-10/03/2020 and underwent bilateral craniotomy for subdural hematoma evacuation following mechanical fall.  During hospitalization patient found to be in atrial fibrillation with RVR and stabilized with rate control strategy.  Also during hospitalization patient noted to have LVEF of 25-30% suspicious for nonischemic cardiomyopathy in the setting of subdural hematoma.  At that time patient was advised to follow-up  in 4 weeks, unfortunately he was then lost to follow up until 03/2021.   Patient was seen for virtual follow-up visit 06/17/2021 at which time recommended patient undergo left and right heart catheterization, echocardiogram, and initiation of oral anticoagulation.  Patient was agreeable to echocardiogram and heart catheterization, however he wished to hold off.  Patient was seen again 06/23/2021 at which time he was agreeable to heart catheterization and echocardiogram, however he remained resistant to anticoagulation until he had seen Dr. Naaman Plummer.   Patient has since followed up with Dr. Naaman Plummer who has not recommended repeat CT scan and may no indication that he feels anticoagulation is contraindicated at this time.  Patient was previously scheduled for cardiac catheterization 08/12/2021, however he canceled this due to logistical issues with difficulty finding transportation and having someone who could take care of his sister while he was gone.   Patient's primary complaint today is that he continues to experience fatigue.  Patient has not been on Ace or ARB due to history of soft blood pressures, however blood pressure is elevated today.  Unfortunately there is no recent renal function panel. 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  Past Medical History:  Diagnosis Date   AKI (acute kidney injury) ([place])    Allergy    Headache    Hx of migraines    Hyperlipidemia    Hypertension    Past Surgical History:  Procedure Laterality Date   CRANIOTOMY Bilateral 09/27/2020   Procedure: BILATERAL CRANIOTOMY FOR SUBDURAL HEMATOMA EVACUATION;  Surgeon: Eustace Moore, MD;  Location: [facility];  Service: Neurosurgery;  Laterality: Bilateral;   KNEE SURGERY Left 1971   TONSILLECTOMY  Family History  Problem Relation Age of Onset   Cancer Mother 75   Colon cancer Mother 11       died age 44   Cancer Father 84       leukemia   Cancer Sister        breast   Diabetes Brother     Cancer Brother 60       prostate   Stomach cancer Brother        thinks dx age 49   Rectal cancer Neg Hx    Prostate cancer Neg Hx    Esophageal cancer Neg Hx    Liver cancer Neg Hx    Pancreatic cancer Neg Hx     Social History   Tobacco Use   Smoking status: Every Day    Packs/day: 0.50    Years: 30.00    Pack years: 15.00    Types: Cigarettes   Smokeless tobacco: Never  Substance Use Topics   Alcohol use: Yes    Alcohol/week: 20.0 standard drinks    Types: 20 Glasses of wine per week   Marital Status: Divorced   ROS  Review of Systems  Constitutional: Positive for malaise/fatigue. Negative for weight gain.  Cardiovascular:  Negative for chest pain, claudication, leg swelling, near-syncope, orthopnea, palpitations, paroxysmal nocturnal dyspnea and syncope.  Respiratory:  Negative for shortness of breath.   Hematologic/Lymphatic: Does not bruise/bleed easily.  Gastrointestinal:  Negative for melena.  Neurological:  Negative for dizziness and weakness.  Psychiatric/Behavioral:  Positive for memory loss.    Objective  Due to telehealth nature of today's visit vitals below are as recorded and reported by patient. Below is physical exam from last office visit for reference.   Blood pressure (!) 159/78, pulse 64, height 6' (1.829 m), weight 190 lb (86.2 kg).  Vitals with BMI 08/19/2021 07/16/2021 06/23/2021  Height 6\' 0"  1\' 0"  6\' 0"   Weight 190 lbs 190 lbs 190 lbs  BMI 25.76 563.14 97.02  Systolic 637 858 850  Diastolic 78 83 80  Pulse 64 48 56     Physical Exam Vitals reviewed.  Eyes:     Comments: Corneal arcus  Cardiovascular:     Rate and Rhythm: Normal rate. Rhythm irregular.     Pulses: Intact distal pulses.     Heart sounds: S1 normal and S2 normal. No murmur heard.   No gallop.  Pulmonary:     Effort: Pulmonary effort is normal. No respiratory distress.     Breath sounds: No wheezing, rhonchi or rales.  Musculoskeletal:     Right lower leg: No edema.      Left lower leg: No edema.  Neurological:     Mental Status: He is alert.    Laboratory examination:   Recent Labs    10/16/20 0448 10/18/20 0505 10/21/20 0743  NA 132* 133* 134*  K 4.4 4.7 4.0  CL 98 101 102  CO2 28 25 26   GLUCOSE 112* 106* 150*  BUN 19 20 16   CREATININE 1.24 1.31* 1.37*  CALCIUM 9.0 9.1 9.1  GFRNONAA >60 59* 56*   CrCl cannot be calculated (Patient's most recent lab result is older than the maximum 21 days allowed.).  CMP Latest Ref Rng & Units 10/21/2020 10/18/2020 10/16/2020  Glucose 70 - 99 mg/dL 150(H) 106(H) 112(H)  BUN 8 - 23 mg/dL 16 20 19   Creatinine 0.61 - 1.24 mg/dL 1.37(H) 1.31(H) 1.24  Sodium 135 - 145 mmol/L 134(L) 133(L) 132(L)  Potassium 3.5 - 5.1  mmol/L 4.0 4.7 4.4  Chloride 98 - 111 mmol/L 102 101 98  CO2 22 - 32 mmol/L 26 25 28   Calcium 8.9 - 10.3 mg/dL 9.1 9.1 9.0  Total Protein 6.5 - 8.1 g/dL - - -  Total Bilirubin 0.3 - 1.2 mg/dL - - -  Alkaline Phos 38 - 126 U/L - - -  AST 15 - 41 U/L - - -  ALT 0 - 44 U/L - - -   CBC Latest Ref Rng & Units 10/21/2020 10/13/2020 10/07/2020  WBC 4.0 - 10.5 K/uL 4.3 7.9 8.2  Hemoglobin 13.0 - 17.0 g/dL 15.4 15.0 15.3  Hematocrit 39.0 - 52.0 % 46.6 42.6 43.4  Platelets 150 - 400 K/uL 151 185 167    Lipid Panel No results for input(s): CHOL, TRIG, LDLCALC, VLDL, HDL, CHOLHDL, LDLDIRECT in the last 8760 hours.  HEMOGLOBIN A1C No results found for: HGBA1C, MPG TSH Recent Labs    09/25/20 1833  TSH 1.660    External labs:  None   Allergies  No Known Allergies   Medications Prior to Visit:   Outpatient Medications Prior to Visit  Medication Sig Dispense Refill   acetaminophen (TYLENOL) 325 MG tablet Take 1-2 tablets (325-650 mg total) by mouth every 4 (four) hours as needed for mild pain.     amiodarone (PACERONE) 200 MG tablet TAKE 1 TABLET DAILY 90 tablet 1   atorvastatin (LIPITOR) 20 MG tablet Take 1 tablet (20 mg total) by mouth daily. 30 tablet 0   digoxin (LANOXIN) 0.125 MG tablet  TAKE ONE TABLET ONCE DAILY 90 tablet 0   metoprolol succinate (TOPROL-XL) 25 MG 24 hr tablet TAKE ONE TABLET IN THE EVENING 90 tablet 0   pantoprazole (PROTONIX) 40 MG tablet Take 1 tablet (40 mg total) by mouth at bedtime. 30 tablet 0   tamsulosin (FLOMAX) 0.4 MG CAPS capsule Take 2 capsules (0.8 mg total) by mouth daily after supper. 60 capsule 0   traZODone (DESYREL) 50 MG tablet Take 1 tablet by mouth at bedtime.     No facility-administered medications prior to visit.   Final Medications at End of Visit    Current Meds  Medication Sig   acetaminophen (TYLENOL) 325 MG tablet Take 1-2 tablets (325-650 mg total) by mouth every 4 (four) hours as needed for mild pain.   amiodarone (PACERONE) 200 MG tablet TAKE 1 TABLET DAILY   apixaban (ELIQUIS) 5 MG TABS tablet Take 1 tablet (5 mg total) by mouth 2 (two) times daily.   atorvastatin (LIPITOR) 20 MG tablet Take 1 tablet (20 mg total) by mouth daily.   digoxin (LANOXIN) 0.125 MG tablet TAKE ONE TABLET ONCE DAILY   metoprolol succinate (TOPROL-XL) 25 MG 24 hr tablet TAKE ONE TABLET IN THE EVENING   pantoprazole (PROTONIX) 40 MG tablet Take 1 tablet (40 mg total) by mouth at bedtime.   tamsulosin (FLOMAX) 0.4 MG CAPS capsule Take 2 capsules (0.8 mg total) by mouth daily after supper.   traZODone (DESYREL) 50 MG tablet Take 1 tablet by mouth at bedtime.   Radiology:   No results found.  CT head without contrast 09/28/2020: Brain: Interval bilateral subdural hematoma evacuation with drain placement on both sides. The right-sided drain is flexed and closely applied to the brain without adjacent brain edema or parenchymal hemorrhage.   The collections have increased since due to gas, measuring up to 3 cm in thickness and causing peaking of the frontal lobes. The degree of high-density blood products is diminished.  Gel-Foam is seen beneath the bilateral craniotomy flaps which do not extend to any of the sinuses.   No complicating infarct,  hydrocephalus, or herniation.   Vascular: Atheromatous calcification   Skull: Unremarkable bilateral craniotomy.   Sinuses/Orbits: Venous gas seen at the skull base.   Other: Case discussed with RN Raquel Sarna and the patient is doing well. A call has been placed to the neuro surgery team.   IMPRESSION: 1. Bilateral subdural hematoma evacuation with prominent degree of pneumocephalus causing marks effect on the frontal lobes, please correlate for clinical signs of tension. 2. Kinking of the right subdural catheter with tip invested in right frontal sulci  Cardiac Studies:  PCV MYOCARDIAL PERFUSION WITH LEXISCAN 05/28/2021 Exercise nuclear stress test was performed using Bruce protocol. Patient reached 4.6 METS, and 54% of age predicted maximum heart rate. In addition, patient was given IV Lexican. Exercise capacity was low. No chest pain reported. Dyspnea reported. Heart rate and hemodynamic response were normal. Rest and stress EKG showed sinus rhythm, low voltage, old anterolateral infarct, no acute ischemic changes. SPECT images showed medium sized, moderate intensity, reversible perfusion defect in apical inferoseptal, apical to basal anterior/anteroseptal myocardium, with associate decreased myocardial thickening and wall motion. Stress LVEF 35%. High risk study.  Echocardiogram 06/23/2021:  Normal LV systolic function with EF 55%. Left ventricle cavity is normal  in size. Moderate concentric hypertrophy of the left ventricle. Normal  global wall motion. Doppler evidence of grade I (impaired) diastolic  dysfunction, normal LAP.  Left atrial cavity is mildly dilated.  Likely bicuspid aortic valve with moderate calcification. Vmax 2.8 m/sec,  mean PG 17 mmHg, AVA 1 cm by continuity equation. Dimensionless index  0.31, suggests moderate aortic stenosis. Mild (Grade I) aortic  regurgitation.  Mild (Grade I) mitral regurgitation.  No evidence of pulmonary hypertension.  Compared to  previous hospital study on 09/26/2020, EF is improved from  25-30%.   EKG:  04/23/2021: Atrial fibrillation with controlled ventricular response at a rate of 50 bpm.  Left axis, left anterior fascicular block.  Incomplete right bundle branch block.  Cannot exclude anteroseptal infarct old.  IVCD.  10/30/2020: Atrial fibrillation with controlled ventricular response at a rate of 61 bpm.  Left axis, left anterior fascicular block.  Poor R wave progression, cannot exclude anteroseptal infarct old.  IVCD.  EKG 09/25/2020: A. fib with RVR Possible old anterior infarct, poor R wave progression Long QT interval  Assessment     ICD-10-CM   1. Persistent atrial fibrillation (HCC)  I48.19 CBC    Basic metabolic panel    2. HFrEF (heart failure with reduced ejection fraction) (HCC)  I50.20 CBC    Basic metabolic panel        There are no discontinued medications.   Meds ordered this encounter  Medications   apixaban (ELIQUIS) 5 MG TABS tablet    Sig: Take 1 tablet (5 mg total) by mouth 2 (two) times daily.    Dispense:  60 tablet    Refill:  3    This patients CHA2DS2-VASc Score 3 (CHF, HTN, Age) and yearly risk of stroke 3.2%.   Recommendations:   Mike Thomas is a 72 y.o. Caucasian male with history of hypertension, hyperlipidemia, persistent atrial fibrillation and heart failure with reduced ejection fraction diagnosed 09/2020.  Patient also has history of low flow low gradient aortic stenosis.   Patient was admitted to [facility] 09/25/2020-10/03/2020 and underwent bilateral craniotomy for subdural hematoma evacuation following mechanical fall.  During hospitalization  patient found to be in atrial fibrillation with RVR and stabilized with rate control strategy.  Also during hospitalization patient noted to have LVEF of 25-30% suspicious for nonischemic cardiomyopathy in the setting of subdural hematoma.  At that time patient was advised to follow-up in 4 weeks, unfortunately he was  then lost to follow up until 03/2021.   Patient was seen for virtual follow-up visit 06/17/2021 at which time recommended patient undergo left and right heart catheterization, echocardiogram, and initiation of oral anticoagulation.  Patient was agreeable to echocardiogram and heart catheterization, however he wished to hold off.  Patient was seen again 06/23/2021 at which time he was agreeable to heart catheterization and echocardiogram, however he remained resistant to anticoagulation until he had seen Dr. Naaman Plummer.   Patient now presents for follow-up.  With the exception of fatigue patient is relatively asymptomatic.  Reviewed and discussed results of echocardiogram, LVEF improved from 25-30% to 55%.  Discussed at length with patient regarding indication, risk, and benefits of anticoagulation.  This is also been discussed with Dr. Einar Gip who agrees with initiation of oral anticoagulation.  Patient verbalized understanding of risks and benefits and agrees to starting Eliquis 5 mg twice daily.  We will repeat CBC following initiation of anticoagulation.  Patient's blood pressure is elevated today, however it is typically well controlled.  Encourage patient to monitor blood pressure closely at home and notify our office if it is consistently >130/80 mmHg.  Will not make changes to antihypertensive medications at today's visit.  Patient continues to tolerate medical therapy including metoprolol, atorvastatin, digoxin, and amiodarone.  We will continue these at this time.  Given improvement of LVEF no indication for up titration of guideline directed medical therapy for heart failure at this time.  Given underlying atrial fibrillation as well as abnormal stress test again discussed risks versus benefits of undergoing left and right heart catheterization.  Patient verbalized understanding of risks and benefits and wishes to proceed with heart catheterization, however he admits it is difficult for him to  coordinate care for his sister as well as transportation as he has limited support in the [place] area.  We will work with the patient accordingly to get him scheduled for left and right heart cath.  Follow up after cath procedure, sooner if needed.     Alethia Berthold, PA-C 08/19/2021, 12:35 PM Office: [phone]

## 2021-08-19 ENCOUNTER — Ambulatory Visit: Payer: Medicare HMO | Admitting: Student

## 2021-08-19 ENCOUNTER — Other Ambulatory Visit: Payer: Self-pay

## 2021-08-19 ENCOUNTER — Encounter: Payer: Self-pay | Admitting: Student

## 2021-08-19 VITALS — BP 159/78 | HR 64 | Ht 72.0 in | Wt 190.0 lb

## 2021-08-19 DIAGNOSIS — I4819 Other persistent atrial fibrillation: Secondary | ICD-10-CM

## 2021-08-19 DIAGNOSIS — I5020 Unspecified systolic (congestive) heart failure: Secondary | ICD-10-CM

## 2021-08-19 MED ORDER — APIXABAN 5 MG PO TABS: 5.0000 mg | ORAL_TABLET | Freq: Two times a day (BID) | ORAL | 3 refills | Status: DC

## 2021-10-12 IMAGING — CT CT HEAD W/O CM
3 of 4 series · 14 of 47 positions shown, 16 images · non-contrast
Comparison: Prior CT from 09/28/2020

CLINICAL DATA: Follow-up examination for subdural hemorrhage.

EXAM:
CT HEAD WITHOUT CONTRAST
TECHNIQUE: Contiguous axial images were obtained from the base of the skull
through the vertex without intravenous contrast.

[Series 4: head 2.0 h70h · axial · 0.43mm/px · z∈[-141,+5]mm · 8 of 93 slices shown, 10 images]
[im 10/93  brain]
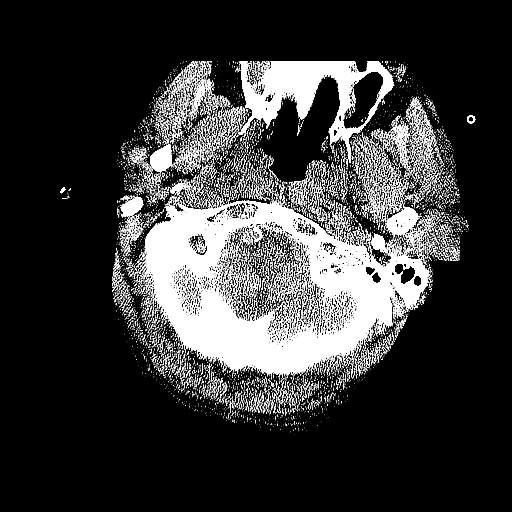
[im 10/93  bone]
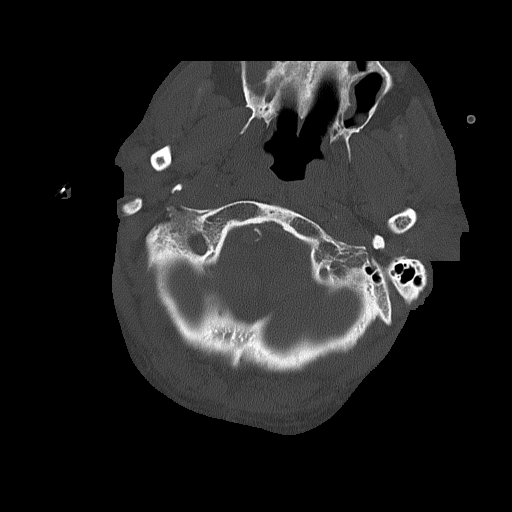
[im 19/93  brain]
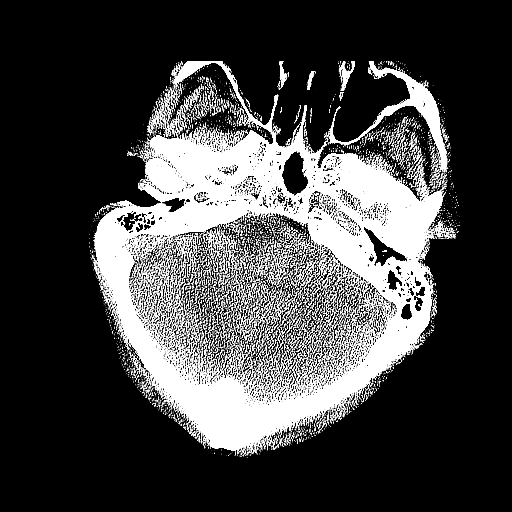
[im 28/93  brain]
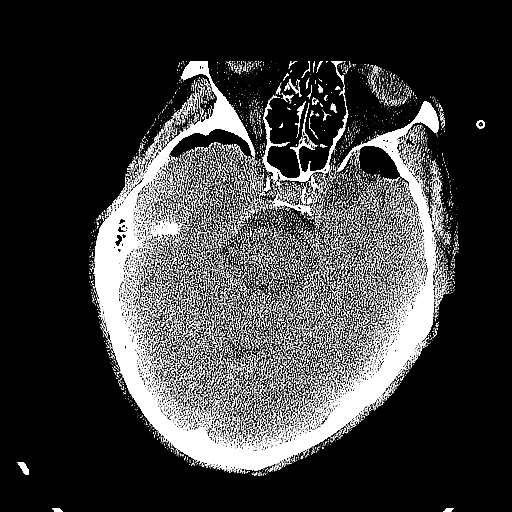
[im 42/93  brain]
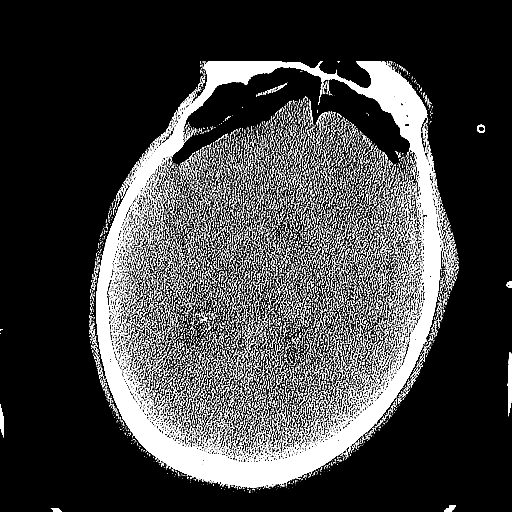
[im 51/93  brain]
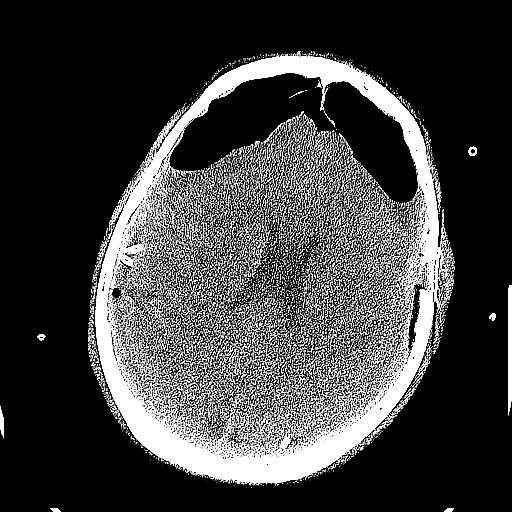
[im 51/93  bone]
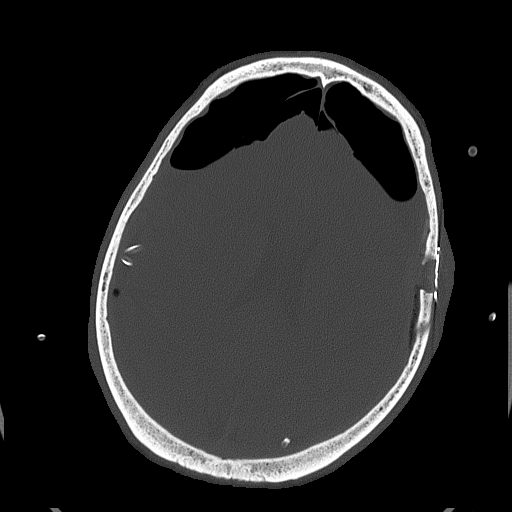
[im 65/93  brain]
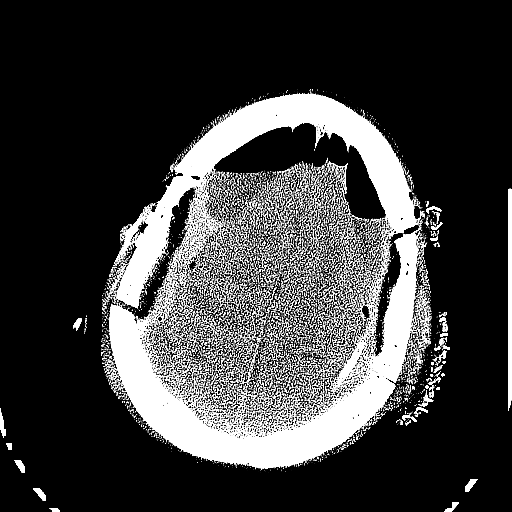
[im 74/93  brain]
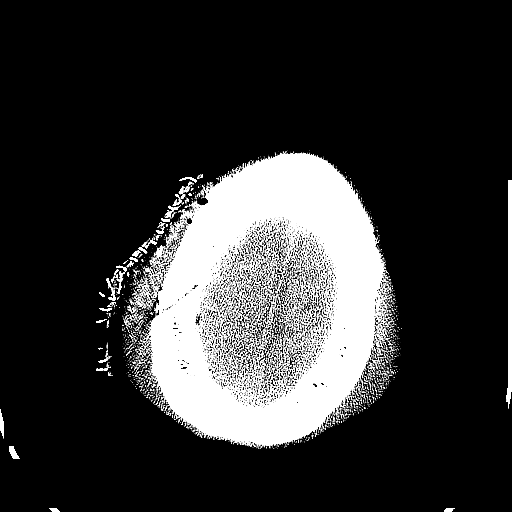
[im 83/93  brain]
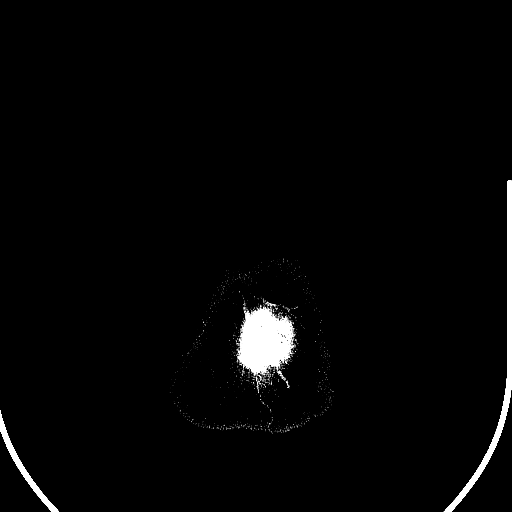

[Series 5: head 3.0 mpr cor · coronal · 0.43mm/px · 3 of 78 slices shown]
[im 26/78  brain]
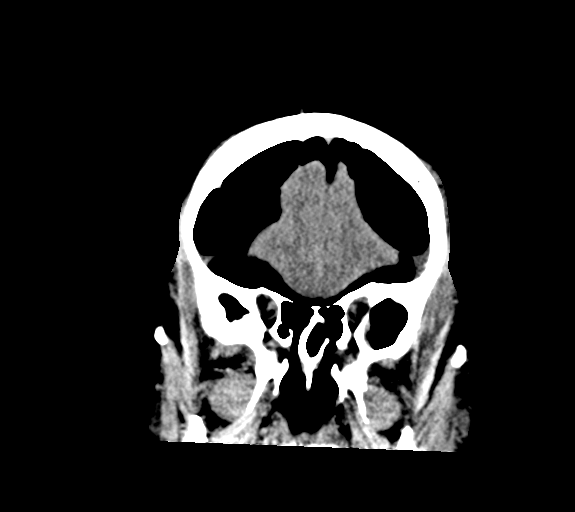
[im 35/78  brain]
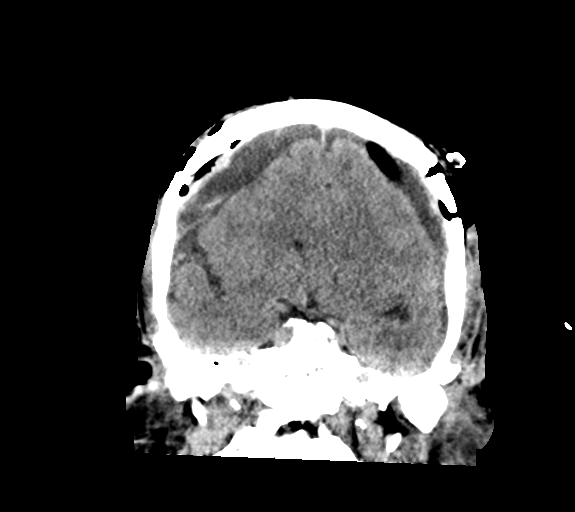
[im 43/78  brain]
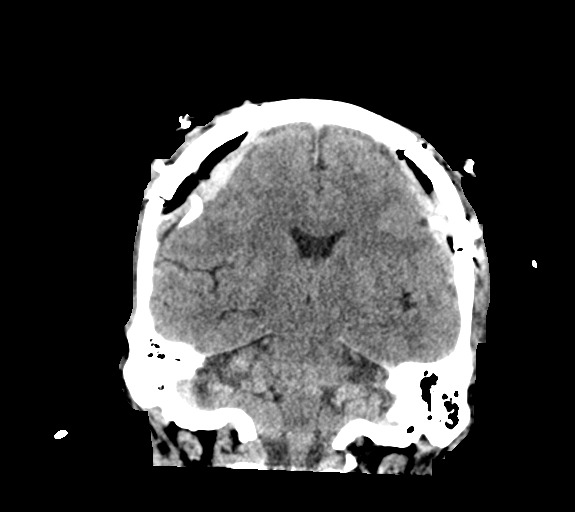

[Series 6: head 3.0 mpr sag · sagittal · 0.39mm/px · 3 of 66 slices shown]
[im 22/66  brain]
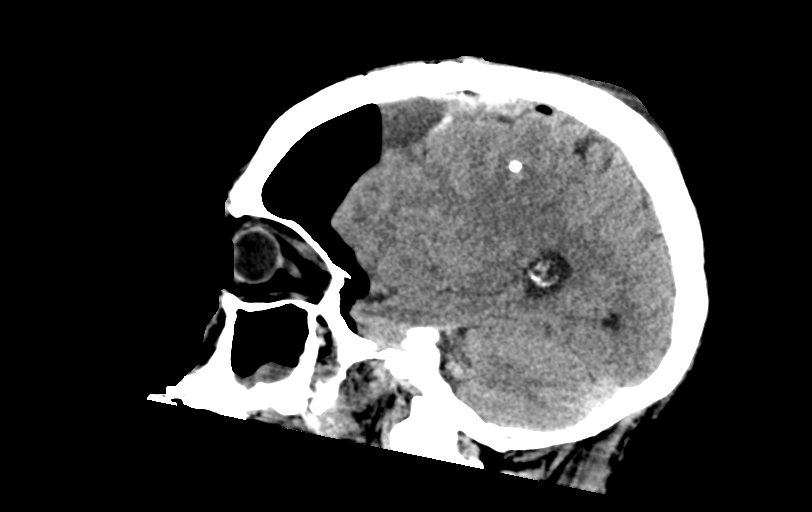
[im 33/66  brain]
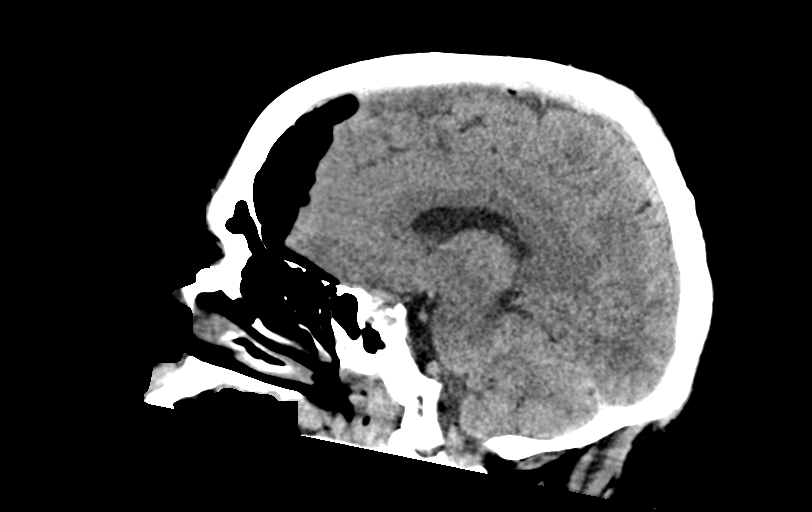
[im 44/66  brain]
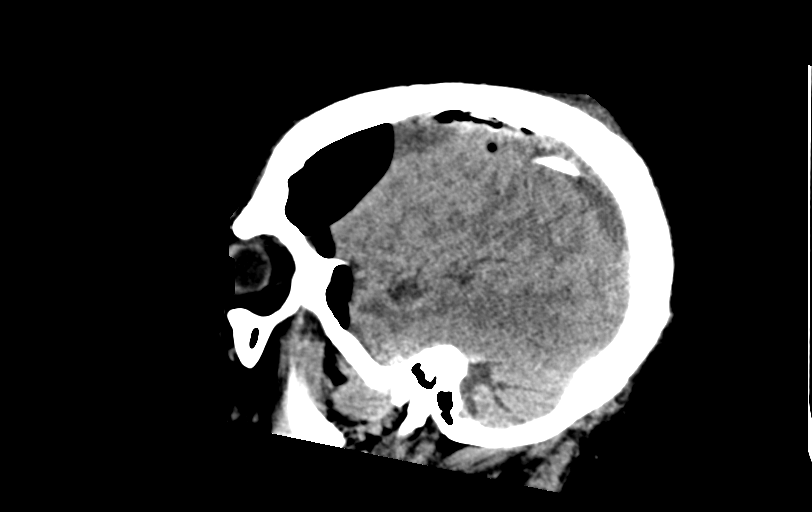

[14 of 47 positions shown; findings below may reference images not displayed]

FINDINGS: Brain: Postoperative changes from bilateral subdural evacuation
again seen. Subdural drains remain in place. The left drain courses
posteriorly and terminates over the left occipital convexity.
Kinking of the right drain with the tip impregnated than the
posterior right frontal region again seen, stable.

There has been slight interval decrease in pneumocephalus from
previous exam with associated slight interval decrease in size of
bilateral subdural collections, now measuring up to approximately
2.4 cm on the right and 2.1 cm on the left. Persistent peaking of
the frontal lobes. Degree of residual high density blood products is
relatively similar. Gel-Foam material again noted subjacent to the
craniotomy bone flaps.

Remainder the brain is stable without new infarct, hemorrhage,
hydrocephalus, or midline shift.

Vascular: No hyperdense vessel. Calcified atherosclerosis at the
skull base.

Skull: Post craniotomy changes without complication.

Sinuses/Orbits: Globes and orbital soft tissues demonstrate no acute
finding. Scattered mucosal thickening noted within the ethmoidal air
cells and maxillary sinuses. Mastoid air cells remain clear.

Other: None.
IMPRESSION: 1. Slight interval decrease in size of bilateral subdural
collections, largely due to slightly decreased pneumocephalus as
compared to previous, although a fairly sizable amount still
remains. Bilateral subdural drains remain in place with the right
drain kinked and invested in the posterior right frontal region,
stable.
2. No other new acute intracranial abnormality.

## 2021-11-05 ENCOUNTER — Other Ambulatory Visit: Payer: Self-pay | Admitting: Cardiology

## 2021-11-14 ENCOUNTER — Other Ambulatory Visit: Payer: Self-pay | Admitting: Cardiology

## 2021-11-14 DIAGNOSIS — I4819 Other persistent atrial fibrillation: Secondary | ICD-10-CM

## 2021-11-14 NOTE — Telephone Encounter (Signed)
Okay to refill? 

## 2021-12-09 ENCOUNTER — Other Ambulatory Visit: Payer: Self-pay | Admitting: Student

## 2022-01-15 ENCOUNTER — Other Ambulatory Visit: Payer: Self-pay | Admitting: Student

## 2022-02-18 ENCOUNTER — Other Ambulatory Visit: Payer: Self-pay | Admitting: Cardiology

## 2022-05-20 ENCOUNTER — Other Ambulatory Visit: Payer: Self-pay

## 2022-05-20 MED ORDER — DIGOXIN 125 MCG PO TABS: 125.0000 ug | ORAL_TABLET | Freq: Every day | ORAL | 0 refills | Status: DC

## 2022-05-29 ENCOUNTER — Other Ambulatory Visit: Payer: Self-pay | Admitting: Cardiology

## 2022-08-20 ENCOUNTER — Other Ambulatory Visit: Payer: Self-pay | Admitting: Cardiology

## 2022-10-05 ENCOUNTER — Encounter: Payer: Self-pay | Admitting: Cardiology

## 2022-10-05 ENCOUNTER — Ambulatory Visit: Payer: Medicare HMO | Admitting: Cardiology

## 2022-10-05 VITALS — BP 117/78 | HR 80 | Resp 16 | Ht 72.0 in | Wt 204.0 lb

## 2022-10-05 DIAGNOSIS — I350 Nonrheumatic aortic (valve) stenosis: Secondary | ICD-10-CM

## 2022-10-05 DIAGNOSIS — Z79899 Other long term (current) drug therapy: Secondary | ICD-10-CM

## 2022-10-05 DIAGNOSIS — E782 Mixed hyperlipidemia: Secondary | ICD-10-CM

## 2022-10-05 DIAGNOSIS — I10 Essential (primary) hypertension: Secondary | ICD-10-CM

## 2022-10-05 DIAGNOSIS — I4819 Other persistent atrial fibrillation: Secondary | ICD-10-CM

## 2022-10-05 DIAGNOSIS — Z7901 Long term (current) use of anticoagulants: Secondary | ICD-10-CM

## 2022-10-05 DIAGNOSIS — Z8679 Personal history of other diseases of the circulatory system: Secondary | ICD-10-CM

## 2022-10-05 DIAGNOSIS — I4892 Unspecified atrial flutter: Secondary | ICD-10-CM

## 2022-10-05 DIAGNOSIS — Z5181 Encounter for therapeutic drug level monitoring: Secondary | ICD-10-CM

## 2022-10-05 MED ORDER — AMIODARONE HCL 200 MG PO TABS: 200.0000 mg | ORAL_TABLET | Freq: Two times a day (BID) | ORAL | 0 refills | Status: DC

## 2022-10-05 MED ORDER — AMIODARONE HCL 200 MG PO TABS: 200.0000 mg | ORAL_TABLET | Freq: Every day | ORAL | 0 refills | Status: DC

## 2022-10-05 NOTE — Progress Notes (Signed)
ID:  Mike Thomas, DOB 1951/04/23, MRN [id number]  PCP:  Chesley Noon, MD  Cardiologist:  Rex Kras, DO, [facility] (established care 10/05/2022) Former Cardiology Providers: Dr. Vernell Leep, Lawerance Cruel, [place]  Date: 10/05/22 Last Office Visit: 08/19/2021  Chief Complaint  Patient presents with   Persistent atrial fibrillation [facility])   Follow-up   Dizziness    HPI  Mike Thomas is a 72 y.o. 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   Formally was seen by physician extender Lawerance Cruel, [place] in April 2023 and now presents to the office to establish care.  In March 2022 after mechanical fall he underwent bilateral craniotomy due to subdural hematoma.  During that hospitalization he was found to be in A-fib with RVR and underwent rate control strategy.  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  Repeat echocardiogram noted improvement in LVEF.  However, MPI was reported to be high risk and given his risk factors he was recommended to undergo angiography.  It has been greater than 1 year and this is still pending.  He presents today for follow-up visit as he is experiencing shortness of breath predominantly with effort related activities.  Patient states that when he walks on level ground or has to climb 18 steps he still is doing well but with overexertion gets dyspneic.  Majority of the days occupied by taking care of her 2 sisters the elder 1 is 44 and has CLL and the younger sister has colon cancer with broken hips.  Currently denies anginal chest pain.  He has been compliant with his  AV nodal blocking agents levels as well as amiodarone.  He may have skipped a couple doses of anticoagulation in the past 3 to 4 weeks.  ALLERGIES: No Known Allergies  MEDICATION LIST PRIOR TO VISIT: Current Meds  Medication Sig   acetaminophen (TYLENOL) 325 MG tablet Take 1-2 tablets (325-650 mg total) by mouth every 4 (four) hours as needed for mild pain.   amiodarone (PACERONE) 200 MG tablet Take 1 tablet (200 mg total) by mouth 2 (two) times daily for 7 days.   [START ON 10/13/2022] amiodarone (PACERONE) 200 MG tablet Take 1 tablet (200 mg total) by mouth daily.   atorvastatin (LIPITOR) 20 MG tablet Take 1 tablet (20 mg total) by mouth daily.   cyanocobalamin (VITAMIN B12) 1000 MCG tablet Take 1,000 mcg by mouth daily.   ELIQUIS 5 MG TABS tablet TAKE ONE TABLET TWICE DAILY   metoprolol succinate (TOPROL-XL) 25 MG 24 hr tablet TAKE ONE TABLET IN THE EVENING   pantoprazole (PROTONIX) 40 MG tablet Take 1 tablet (40 mg total) by mouth at bedtime.   tamsulosin (FLOMAX) 0.4 MG CAPS capsule Take 2 capsules (0.8 mg total) by mouth daily after supper.   traZODone (DESYREL) 50 MG tablet Take 1 tablet by mouth at bedtime.   [DISCONTINUED] amiodarone (PACERONE) 200 MG tablet TAKE 1 TABLET DAILY   [DISCONTINUED] digoxin (LANOXIN) 0.125 MG tablet TAKE ONE TABLET DAILY     PAST MEDICAL HISTORY: Past Medical History:  Diagnosis Date   AKI (acute kidney injury) ([place])    Allergy    Headache    Hx of migraines    Hyperlipidemia  Hypertension     PAST SURGICAL HISTORY: Past Surgical History:  Procedure Laterality Date   CRANIOTOMY Bilateral 09/27/2020   Procedure: BILATERAL CRANIOTOMY FOR SUBDURAL HEMATOMA EVACUATION;  Surgeon: Eustace Moore, MD;  Location: [place];  Service: Neurosurgery;  Laterality: Bilateral;   KNEE SURGERY Left 1971   TONSILLECTOMY      FAMILY HISTORY: The patient family history includes Cancer in his sister; Cancer (age of onset: 56) in his father; Cancer (age of  onset: 42) in his brother; Cancer (age of onset: 63) in his mother; Colon cancer (age of onset: 68) in his mother; Diabetes in his brother; Stomach cancer in his brother.  SOCIAL HISTORY:  The patient  reports that he has been smoking cigarettes. He has a 15.00 pack-year smoking history. He has never used smokeless tobacco. He reports current alcohol use of about 20.0 standard drinks of alcohol per week. He reports that he does not use drugs.  REVIEW OF SYSTEMS: Review of Systems  Cardiovascular:  Positive for dyspnea on exertion and palpitations. Negative for chest pain, claudication, irregular heartbeat, leg swelling, near-syncope, orthopnea, paroxysmal nocturnal dyspnea and syncope.  Respiratory:  Negative for shortness of breath.   Hematologic/Lymphatic: Negative for bleeding problem.  Musculoskeletal:  Negative for muscle cramps and myalgias.  Neurological:  Negative for dizziness and light-headedness.       Trouble w/ gait    PHYSICAL EXAM:    10/05/2022    1:05 PM 08/19/2021   11:05 AM 07/16/2021   10:39 AM  Vitals with BMI  Height '6\' 0"'$  '6\' 0"'$  '1\' 0"'$   Weight 204 lbs 190 lbs 190 lbs  BMI 27.66 Q000111Q [id number]  Systolic 123XX123 Q000111Q 0000000  Diastolic 78 78 83  Pulse 80 64 48   Orthostatic VS for the past 72 hrs (Last 3 readings):  Orthostatic BP Patient Position BP Location Cuff Size Orthostatic Pulse  10/05/22 1311 142/80 Standing Left Arm Normal (!) 43  10/05/22 1310 121/81 Sitting Left Arm Normal 67  10/05/22 1309 131/79 Supine Left Arm Normal 60    Physical Exam  Constitutional: No distress.  Age appropriate, hemodynamically stable.   Neck: No JVD present.  Cardiovascular: S1 normal, S2 normal, intact distal pulses and normal pulses. An irregularly irregular rhythm present. Tachycardia present. Exam reveals no gallop, no S3 and no S4.  Murmur heard. Crescendo-decrescendo systolic murmur is present with a grade of 3/6 at the upper right sternal border. Pulmonary/Chest: Effort  normal and breath sounds normal. No stridor. He has no wheezes. He has no rales.  Abdominal: Soft. Bowel sounds are normal. He exhibits no distension. There is no abdominal tenderness.  Musculoskeletal:        General: No edema.     Cervical back: Neck supple.  Neurological: He is alert and oriented to person, place, and time. He has intact cranial nerves (2-12).  Skin: Skin is warm and moist.   CARDIAC DATABASE: EKG: 10/05/2022: Atrial flutter, 138 bpm, poor R wave progression.  Compared to prior EKG 04/15/2021 ventricular rate is faster.  Echocardiogram: 09/26/2020: 1. Mildly dilated left ventricule with normal wall thickness. Severe global hypokinesis, LVEF 25-Left ventricular ejection fraction, by estimation, is 25 to 30%. No LV thrombus seen. Left ventricular diastolic parameters are indeterminate due to atrial fibrillation. 2. Right ventricular systolic function is low normal. The right ventricular size is normal. 3. Left atrial size was mildly dilated. 4. Right atrial size was mildly dilated. 5. The mitral valve is grossly normal. Mild mitral valve regurgitation.  6. The aortic valve is abnormal. Aortic valve regurgitation is not visualized. Aortic valve area, by VTI measures 0.92 cm. Aortic valve Vmax measures 2.08 m/s.  [id number] Normal LV systolic function with EF 55%. Left ventricle cavity is normal in size. Moderate concentric hypertrophy of the left ventricle. Normal global wall motion. Doppler evidence of grade I (impaired) diastolic dysfunction, normal LAP. Left atrial cavity is mildly dilated. Likely bicuspid aortic valve with moderate calcification. Vmax 2.8 m/sec, mean PG 17 mmHg, AVA 1 cm by continuity equation. Dimensionless index 0.31, suggests moderate aortic stenosis. Mild (Grade I) aortic regurgitation. Mild (Grade I) mitral regurgitation. No evidence of pulmonary hypertension. Compared to previous hospital study on 09/26/2020, EF is improved from 25-30%.  Stress  Testing: Exercise/Lexiscan Tetrofosmin stress test 05/28/2021: Exercise nuclear stress test was performed using Bruce protocol. Patient reached 4.6 METS, and 54% of age predicted maximum heart rate. In addition, patient was given IV Lexican. Exercise capacity was low. No chest pain reported. Dyspnea reported. Heart rate and hemodynamic response were normal. Rest and stress EKG showed sinus rhythm, low voltage, old anterolateral infarct, no acute ischemic changes. SPECT images showed medium sized, moderate intensity, reversible perfusion defect in apical inferoseptal, apical to basal anterior/anteroseptal myocardium, with associate decreased myocardial thickening and wall motion. Stress LVEF 35%. High risk study.   Heart Catheterization: None  LABORATORY DATA:    Latest Ref Rng & Units 10/21/2020    7:43 AM 10/13/2020    4:44 AM 10/07/2020    4:48 AM  CBC  WBC 4.0 - 10.5 K/uL 4.3  7.9  8.2   Hemoglobin 13.0 - 17.0 g/dL 15.4  15.0  15.3   Hematocrit 39.0 - 52.0 % 46.6  42.6  43.4   Platelets 150 - 400 K/uL 151  185  167        Latest Ref Rng & Units 10/21/2020    7:43 AM 10/18/2020    5:05 AM 10/16/2020    4:48 AM  CMP  Glucose 70 - 99 mg/dL 150  106  112   BUN 8 - 23 mg/dL '16  20  19   '$ Creatinine 0.61 - 1.24 mg/dL 1.37  1.31  1.24   Sodium 135 - 145 mmol/L 134  133  132   Potassium 3.5 - 5.1 mmol/L 4.0  4.7  4.4   Chloride 98 - 111 mmol/L 102  101  98   CO2 22 - 32 mmol/L '26  25  28   '$ Calcium 8.9 - 10.3 mg/dL 9.1  9.1  9.0     Lipid Panel  No results found for: "CHOL", "TRIG", "HDL", "CHOLHDL", "VLDL", "LDLCALC", "LDLDIRECT", "LABVLDL"  No components found for: "NTPROBNP" No results for input(s): "PROBNP" in the last 8760 hours. No results for input(s): "TSH" in the last 8760 hours.  BMP No results for input(s): "NA", "K", "CL", "CO2", "GLUCOSE", "BUN", "CREATININE", "CALCIUM", "GFRNONAA", "GFRAA" in the last 8760 hours.  HEMOGLOBIN A1C No results found for: "HGBA1C",  "MPG"  IMPRESSION:    ICD-10-CM   1. Atrial flutter with rapid ventricular response (HCC)  I48.92 EKG 12-Lead    Lipid Panel With LDL/HDL Ratio    LDL cholesterol, direct    CMP14+EGFR    CBC with Differential/Platelet    Pro b natriuretic peptide (BNP)    amiodarone (PACERONE) 200 MG tablet    amiodarone (PACERONE) 200 MG tablet    PCV ECHOCARDIOGRAM COMPLETE    2. History of cardiomyopathy  Z86.79 PCV ECHOCARDIOGRAM COMPLETE  3. Persistent atrial fibrillation ([place])  I48.19     4. Long term (current) use of anticoagulants  Z79.01     5. Long term current use of antiarrhythmic drug  Z79.899     6. Benign hypertension  I10     7. Mixed hyperlipidemia  E78.2     8. Nonrheumatic aortic valve stenosis  I35.0     9. Therapeutic drug monitoring  Z51.81 DG Chest 2 View    Pulmonary Function Test [facility] Only    TSH       RECOMMENDATIONS: Madoxx Hankes is a 72 y.o. Caucasian male whose past medical history and cardiac risk factors include: Hypertension, hyperlipidemia, history of persistent atrial fibrillation, recovered cardiomyopathy, documented history of history of low-flow low gradient aortic stenosis, status post fall in March 2022 requiring bilateral craniotomy for subdural evacuation, smoker (3 cigarette /day).   Presents today as he has been experiencing dyspnea on exertion when he overexerts.  He has a diagnosis of atrial fibrillation but EKG today shows atrial flutter with rapid ventricular rate.  Hemodynamically he denies near-syncope/syncope/episodes of orthostasis or hypotension.  He has missed his Eliquis doses in the past 3 to 4 weeks therefore sending him to ED for direct-current cardioversion when he is hemodynamically stable is not an option.  Given his rapid ventricular rate the safest would be direct hospitalization for parenteral medical therapy and possible TEE guided cardioversion if needed.  However, patient states that he does not want to go to the hospital.   He understands that A-fib with RVR can lead to hemodynamic compromise which can worsen morbidity and mortality.  Patient states that he has 2 sisters at home who he is the primary caregiver to.  Increase amiodarone to 200 mg p.o. twice daily for 1 week and then transition him down to 200 mg p.o. daily.  Discontinue digoxin since we are increasing amiodarone to prevent drug to drug interactions.  Emphasized the importance of compliance with anticoagulation.  Will schedule for TEE guided cardioversion in 4 weeks.  After careful review of history and examination, the risks, benefits of transesophageal echocardiogram, and alternatives have been explained to the patient. Complications include but not limited to esophageal perforation (rare), gastrointestinal bleeding (rare), cardiac arrhythmia which can include cardiac arrest and death (rare), pharyngeal irritation / discomfort with swallowing / hematoma, methemoglobinemia, bronchospasm, transient hypoxia, nonsustained ventricular tachycardia, transient atrial fibrillation, minimal hemoptysis, vomiting, hypotension, respiratory compromise, reaction to medications, unavoidable damage to teeth and gums, aspiration pneumonia  were reviewed with the patient.  Patient voices understands, provides verbal feedback, questions answered, and patient wishes to proceed with the procedure.  Risks, benefits, and alternatives of direct current cardioversion reviewed with the and patient. Risk includes but not limited to: potential for post-cardioversion rhythms, life-threatening arrhythmias (ventricular tachycardia and fibrillation, profound bradycardia, cardiac arrest), myocardial damage, acute pulmonary edema, skin burns, transient hypotension. Benefits include restoration of sinus rhythm. Alternatives to treatment were discussed, questions were answered, patient voices understanding and provides verbal feedback.  Patient is willing to proceed.   Echo will be ordered  to evaluate for structural heart disease and left ventricular systolic function.  Checks chest x-ray, PFTs, LFTs, TSH given the prolonged use of amiodarone.  FINAL MEDICATION LIST END OF ENCOUNTER: Meds ordered this encounter  Medications   amiodarone (PACERONE) 200 MG tablet    Sig: Take 1 tablet (200 mg total) by mouth 2 (two) times daily for 7 days.    Dispense:  14 tablet  Refill:  0   amiodarone (PACERONE) 200 MG tablet    Sig: Take 1 tablet (200 mg total) by mouth daily.    Dispense:  90 tablet    Refill:  0    Medications Discontinued During This Encounter  Medication Reason   amiodarone (PACERONE) 200 MG tablet    digoxin (LANOXIN) 0.125 MG tablet      Current Outpatient Medications:    acetaminophen (TYLENOL) 325 MG tablet, Take 1-2 tablets (325-650 mg total) by mouth every 4 (four) hours as needed for mild pain., Disp: , Rfl:    amiodarone (PACERONE) 200 MG tablet, Take 1 tablet (200 mg total) by mouth 2 (two) times daily for 7 days., Disp: 14 tablet, Rfl: 0   [START ON 10/13/2022] amiodarone (PACERONE) 200 MG tablet, Take 1 tablet (200 mg total) by mouth daily., Disp: 90 tablet, Rfl: 0   atorvastatin (LIPITOR) 20 MG tablet, Take 1 tablet (20 mg total) by mouth daily., Disp: 30 tablet, Rfl: 0   cyanocobalamin (VITAMIN B12) 1000 MCG tablet, Take 1,000 mcg by mouth daily., Disp: , Rfl:    ELIQUIS 5 MG TABS tablet, TAKE ONE TABLET TWICE DAILY, Disp: 60 tablet, Rfl: 3   metoprolol succinate (TOPROL-XL) 25 MG 24 hr tablet, TAKE ONE TABLET IN THE EVENING, Disp: 90 tablet, Rfl: 3   pantoprazole (PROTONIX) 40 MG tablet, Take 1 tablet (40 mg total) by mouth at bedtime., Disp: 30 tablet, Rfl: 0   tamsulosin (FLOMAX) 0.4 MG CAPS capsule, Take 2 capsules (0.8 mg total) by mouth daily after supper., Disp: 60 capsule, Rfl: 0   traZODone (DESYREL) 50 MG tablet, Take 1 tablet by mouth at bedtime., Disp: , Rfl:   Orders Placed This Encounter  Procedures   DG Chest 2 View   Lipid Panel  With LDL/HDL Ratio   LDL cholesterol, direct   CMP14+EGFR   CBC with Differential/Platelet   Pro b natriuretic peptide (BNP)   TSH   Pulmonary Function Test [facility] Only   EKG 12-Lead   PCV ECHOCARDIOGRAM COMPLETE    There are no Patient Instructions on file for this visit.   --Continue cardiac medications as reconciled in final medication list. --Return in about 6 weeks (around 11/16/2022). or sooner if needed. --Continue follow-up with your primary care physician regarding the management of your other chronic comorbid conditions.  Patient's questions and concerns were addressed to his satisfaction. He voices understanding of the instructions provided during this encounter.   This note was created using a voice recognition software as a result there may be grammatical errors inadvertently enclosed that do not reflect the nature of this encounter. Every attempt is made to correct such errors.  Rex Kras, [place], [facility]  Pager:  [phone] Office: [phone]

## 2022-10-07 ENCOUNTER — Encounter: Payer: Self-pay | Admitting: Cardiology

## 2022-10-08 ENCOUNTER — Other Ambulatory Visit (HOSPITAL_COMMUNITY): Payer: Self-pay | Admitting: Radiology

## 2022-10-08 DIAGNOSIS — Z5181 Encounter for therapeutic drug level monitoring: Secondary | ICD-10-CM

## 2022-10-15 ENCOUNTER — Other Ambulatory Visit: Payer: Medicare HMO

## 2022-11-05 ENCOUNTER — Other Ambulatory Visit: Payer: Medicare HMO

## 2022-11-17 ENCOUNTER — Ambulatory Visit (HOSPITAL_COMMUNITY): Payer: Medicare HMO

## 2023-02-20 ENCOUNTER — Encounter: Payer: Self-pay | Admitting: Cardiology

## 2023-02-20 NOTE — Progress Notes (Signed)
He was suppose to have TEE cardioversion.   Still not performed.   Office staff has reached out to him and he informed us that - - - he cancelled all his appts because he has to take care of his sister.  Isabelle Matt [place], DO, [facility]

## 2023-03-19 ENCOUNTER — Other Ambulatory Visit: Payer: Self-pay | Admitting: Cardiology

## 2023-03-19 DIAGNOSIS — I4892 Unspecified atrial flutter: Secondary | ICD-10-CM

## 2023-03-19 MED ORDER — AMIODARONE HCL 200 MG PO TABS: 100.0000 mg | ORAL_TABLET | Freq: Every day | ORAL | 0 refills | Status: DC

## 2023-03-19 NOTE — Progress Notes (Signed)
Patient requested refill on amiodarone.  Last seen in the office in March 2024.  Will send in 30-day supply of amiodarone.  Will reduce amiodarone dose to 100 mg p.o. daily.  Needs to follow-up in the office as the medication requires monitoring and re discuss cardioversion.   Mike Thomas [place], DO, [facility]

## 2023-04-13 ENCOUNTER — Ambulatory Visit: Payer: Medicare HMO | Admitting: Cardiology

## 2023-04-13 ENCOUNTER — Encounter: Payer: Self-pay | Admitting: Cardiology

## 2023-04-13 VITALS — BP 141/93 | HR 60 | Resp 16 | Ht 72.0 in | Wt 205.2 lb

## 2023-04-13 DIAGNOSIS — Z7901 Long term (current) use of anticoagulants: Secondary | ICD-10-CM

## 2023-04-13 DIAGNOSIS — I350 Nonrheumatic aortic (valve) stenosis: Secondary | ICD-10-CM

## 2023-04-13 DIAGNOSIS — E782 Mixed hyperlipidemia: Secondary | ICD-10-CM

## 2023-04-13 DIAGNOSIS — Z79899 Other long term (current) drug therapy: Secondary | ICD-10-CM

## 2023-04-13 DIAGNOSIS — I4891 Unspecified atrial fibrillation: Secondary | ICD-10-CM

## 2023-04-13 DIAGNOSIS — Z5181 Encounter for therapeutic drug level monitoring: Secondary | ICD-10-CM

## 2023-04-13 DIAGNOSIS — I10 Essential (primary) hypertension: Secondary | ICD-10-CM

## 2023-04-13 NOTE — Progress Notes (Unsigned)
ID:  Mike Thomas, DOB 05/26/1951, MRN [id number]  PCP:  Eartha Inch, MD  Cardiologist:  Tessa Lerner, DO, [facility] (established care 10/05/2022) Former Cardiology Providers: Dr. Truett Mainland, Elvin So, [place]  Date: 04/13/23 Last Office Visit: 10/05/2022  Chief Complaint  Patient presents with   Persistent atrial fibrillation [facility])   Follow-up    HPI  Mike Thomas is a 72 y.o. Caucasian male whose past medical history and cardiovascular risk factors include: Hypertension, hyperlipidemia, history of persistent atrial fibrillation /flutter, recovered cardiomyopathy, documented history of history of low-flow low gradient aortic stenosis, status post fall in March 2022 requiring bilateral craniotomy for subdural evacuation, smoker (3 cigarette /day).   In March 2022 after mechanical fall he underwent bilateral craniotomy due to subdural hematoma.  During that hospitalization he was found to be in A-fib with RVR and underwent rate control strategy.  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  Repeat echocardiogram noted improvement in LVEF.  MPI as of 2022 noted concerns for reversible ischemia and he was recommended to undergo angiography which she has postponed for more than a year.  At the last office visit he was noted to be having atrial flutter with rapid ventricular rate and therefore was recommended to undergo TEE guided cardioversion.  However patient refused as he takes care of 2 sisters (82 is 80 years old with CLL in the younger sister has colon cancer with broken hips).   ***     ALLERGIES: No Known Allergies  MEDICATION LIST PRIOR TO VISIT: Current Meds  Medication Sig   acetaminophen (TYLENOL) 325 MG tablet Take 1-2 tablets (325-650 mg total) by mouth every 4 (four) hours as needed for mild pain.   amiodarone (PACERONE) 200 MG tablet Take 0.5 tablets (100 mg total) by mouth daily.    atorvastatin (LIPITOR) 20 MG tablet Take 1 tablet (20 mg total) by mouth daily.   cyanocobalamin (VITAMIN B12) 1000 MCG tablet Take 1,000 mcg by mouth daily.   ELIQUIS 5 MG TABS tablet TAKE ONE TABLET TWICE DAILY   metoprolol succinate (TOPROL-XL) 25 MG 24 hr tablet TAKE ONE TABLET IN THE EVENING   pantoprazole (PROTONIX) 40 MG tablet Take 1 tablet (40 mg total) by mouth at bedtime.   tamsulosin (FLOMAX) 0.4 MG CAPS capsule Take 2 capsules (0.8 mg total) by mouth daily after supper.   traZODone (DESYREL) 50 MG tablet Take 1 tablet by mouth at bedtime.     PAST MEDICAL HISTORY: Past Medical History:  Diagnosis Date   AKI (acute kidney injury) (HCC)    Allergy    Headache    Hx of migraines    Hyperlipidemia    Hypertension     PAST SURGICAL HISTORY: Past Surgical History:  Procedure Laterality Date   CRANIOTOMY Bilateral 09/27/2020   Procedure: BILATERAL CRANIOTOMY FOR SUBDURAL HEMATOMA EVACUATION;  Surgeon: Tia Alert, MD;  Location: [facility] OR;  Service: Neurosurgery;  Laterality: Bilateral;   KNEE SURGERY Left 1971   TONSILLECTOMY      FAMILY HISTORY: The patient family history includes Cancer in his sister; Cancer (age of onset: 1) in his father; Cancer (age of onset: 33) in his brother; Cancer (age of onset: 57) in his mother; Colon cancer (age of onset: 70) in his mother; Diabetes in his brother; Stomach cancer in his brother.  SOCIAL HISTORY:  The patient  reports that he has been smoking cigarettes. He has a 15 pack-year smoking  history. He has never used smokeless tobacco. He reports current alcohol use of about 20.0 standard drinks of alcohol per week. He reports that he does not use drugs.  REVIEW OF SYSTEMS: Review of Systems  Cardiovascular:  Positive for dyspnea on exertion and palpitations. Negative for chest pain, claudication, irregular heartbeat, leg swelling, near-syncope, orthopnea, paroxysmal nocturnal dyspnea and syncope.  Respiratory:  Negative for shortness  of breath.   Hematologic/Lymphatic: Negative for bleeding problem.  Musculoskeletal:  Negative for muscle cramps and myalgias.  Neurological:  Negative for dizziness and light-headedness.       Trouble w/ gait    PHYSICAL EXAM:    04/13/2023    3:22 PM 10/05/2022    1:05 PM 08/19/2021   11:05 AM  Vitals with BMI  Height 6\' 0"  6\' 0"  6\' 0"   Weight 205 lbs 3 oz 204 lbs 190 lbs  BMI 27.82 27.66 25.76  Systolic 141 117 098  Diastolic 93 78 78  Pulse 60 80 64     Physical Exam  Constitutional: No distress.  Age appropriate, hemodynamically stable.   Neck: No JVD present.  Cardiovascular: S1 normal, S2 normal, intact distal pulses and normal pulses. An irregularly irregular rhythm present. Tachycardia present. Exam reveals no gallop, no S3 and no S4.  Murmur heard. Crescendo-decrescendo systolic murmur is present with a grade of 3/6 at the upper right sternal border. Pulmonary/Chest: Effort normal and breath sounds normal. No stridor. He has no wheezes. He has no rales.  Abdominal: Soft. Bowel sounds are normal. He exhibits no distension. There is no abdominal tenderness.  Musculoskeletal:        General: No edema.     Cervical back: Neck supple.  Neurological: He is alert and oriented to person, place, and time. He has intact cranial nerves (2-12).  Skin: Skin is warm and moist.   CARDIAC DATABASE: EKG: 10/05/2022: Atrial flutter, 138 bpm, poor R wave progression.  Compared to prior EKG 04/15/2021 ventricular rate is faster.   Echocardiogram: 09/26/2020: 1. Mildly dilated left ventricule with normal wall thickness. Severe global hypokinesis, LVEF 25-Left ventricular ejection fraction, by estimation, is 25 to 30%. No LV thrombus seen. Left ventricular diastolic parameters are indeterminate due to atrial fibrillation. 2. Right ventricular systolic function is low normal. The right ventricular size is normal. 3. Left atrial size was mildly dilated. 4. Right atrial size was mildly  dilated. 5. The mitral valve is grossly normal. Mild mitral valve regurgitation. 6. The aortic valve is abnormal. Aortic valve regurgitation is not visualized. Aortic valve area, by VTI measures 0.92 cm. Aortic valve Vmax measures 2.08 m/s.  06/23/2021 Normal LV systolic function with EF 55%. Left ventricle cavity is normal in size. Moderate concentric hypertrophy of the left ventricle. Normal global wall motion. Doppler evidence of grade I (impaired) diastolic dysfunction, normal LAP. Left atrial cavity is mildly dilated. Likely bicuspid aortic valve with moderate calcification. Vmax 2.8 m/sec, mean PG 17 mmHg, AVA 1 cm by continuity equation. Dimensionless index 0.31, suggests moderate aortic stenosis. Mild (Grade I) aortic regurgitation. Mild (Grade I) mitral regurgitation. No evidence of pulmonary hypertension. Compared to previous hospital study on 09/26/2020, EF is improved from 25-30%.  Stress Testing: Exercise/Lexiscan Tetrofosmin stress test 05/28/2021: Exercise nuclear stress test was performed using Bruce protocol. Patient reached 4.6 METS, and 54% of age predicted maximum heart rate. In addition, patient was given IV Lexican. Exercise capacity was low. No chest pain reported. Dyspnea reported. Heart rate and hemodynamic response were normal. Rest and  stress EKG showed sinus rhythm, low voltage, old anterolateral infarct, no acute ischemic changes. SPECT images showed medium sized, moderate intensity, reversible perfusion defect in apical inferoseptal, apical to basal anterior/anteroseptal myocardium, with associate decreased myocardial thickening and wall motion. Stress LVEF 35%. High risk study.   Heart Catheterization: None  LABORATORY DATA:    Latest Ref Rng & Units 10/21/2020    7:43 AM 10/13/2020    4:44 AM 10/07/2020    4:48 AM  CBC  WBC 4.0 - 10.5 K/uL 4.3  7.9  8.2   Hemoglobin 13.0 - 17.0 g/dL 16.1  09.6  04.5   Hematocrit 39.0 - 52.0 % 46.6  42.6  43.4   Platelets  150 - 400 K/uL 151  185  167        Latest Ref Rng & Units 10/21/2020    7:43 AM 10/18/2020    5:05 AM 10/16/2020    4:48 AM  CMP  Glucose 70 - 99 mg/dL 409  811  914   BUN 8 - 23 mg/dL 16  20  19    Creatinine 0.61 - 1.24 mg/dL 7.82  9.56  2.13   Sodium 135 - 145 mmol/L 134  133  132   Potassium 3.5 - 5.1 mmol/L 4.0  4.7  4.4   Chloride 98 - 111 mmol/L 102  101  98   CO2 22 - 32 mmol/L 26  25  28    Calcium 8.9 - 10.3 mg/dL 9.1  9.1  9.0     Lipid Panel  No results found for: "CHOL", "TRIG", "HDL", "CHOLHDL", "VLDL", "LDLCALC", "LDLDIRECT", "LABVLDL"  No components found for: "NTPROBNP" No results for input(s): "PROBNP" in the last 8760 hours. No results for input(s): "TSH" in the last 8760 hours.  BMP No results for input(s): "NA", "K", "CL", "CO2", "GLUCOSE", "BUN", "CREATININE", "CALCIUM", "GFRNONAA", "GFRAA" in the last 8760 hours.  HEMOGLOBIN A1C No results found for: "HGBA1C", "MPG"  IMPRESSION:    ICD-10-CM   1. Atrial fibrillation and flutter (HCC)  I48.91 EKG 12-Lead   I48.92 Pulmonary function test    CMP14+EGFR    Hemoglobin and hematocrit, blood    2. Long term (current) use of anticoagulants  Z79.01 Pulmonary function test    CMP14+EGFR    Hemoglobin and hematocrit, blood    3. Long term current use of antiarrhythmic drug  Z79.899 Pulmonary function test    CMP14+EGFR    Hemoglobin and hematocrit, blood    4. Benign hypertension  I10     5. Mixed hyperlipidemia  E78.2     6. Nonrheumatic aortic valve stenosis  I35.0     7. Therapeutic drug monitoring  Z51.81        RECOMMENDATIONS: Mike Thomas is a 72 y.o. Caucasian male whose past medical history and cardiac risk factors include: Hypertension, hyperlipidemia, history of persistent atrial fibrillation, recovered cardiomyopathy, documented history of history of low-flow low gradient aortic stenosis, status post fall in March 2022 requiring bilateral craniotomy for subdural evacuation, smoker (3  cigarette /day).   ***  FINAL MEDICATION LIST END OF ENCOUNTER: No orders of the defined types were placed in this encounter.   There are no discontinued medications.    Current Outpatient Medications:    acetaminophen (TYLENOL) 325 MG tablet, Take 1-2 tablets (325-650 mg total) by mouth every 4 (four) hours as needed for mild pain., Disp: , Rfl:    amiodarone (PACERONE) 200 MG tablet, Take 0.5 tablets (100 mg total) by mouth daily., Disp: 15  tablet, Rfl: 0   atorvastatin (LIPITOR) 20 MG tablet, Take 1 tablet (20 mg total) by mouth daily., Disp: 30 tablet, Rfl: 0   cyanocobalamin (VITAMIN B12) 1000 MCG tablet, Take 1,000 mcg by mouth daily., Disp: , Rfl:    ELIQUIS 5 MG TABS tablet, TAKE ONE TABLET TWICE DAILY, Disp: 60 tablet, Rfl: 3   metoprolol succinate (TOPROL-XL) 25 MG 24 hr tablet, TAKE ONE TABLET IN THE EVENING, Disp: 90 tablet, Rfl: 3   pantoprazole (PROTONIX) 40 MG tablet, Take 1 tablet (40 mg total) by mouth at bedtime., Disp: 30 tablet, Rfl: 0   tamsulosin (FLOMAX) 0.4 MG CAPS capsule, Take 2 capsules (0.8 mg total) by mouth daily after supper., Disp: 60 capsule, Rfl: 0   traZODone (DESYREL) 50 MG tablet, Take 1 tablet by mouth at bedtime., Disp: , Rfl:   Orders Placed This Encounter  Procedures   CMP14+EGFR   Hemoglobin and hematocrit, blood   EKG 12-Lead   Pulmonary function test    There are no Patient Instructions on file for this visit.   --Continue cardiac medications as reconciled in final medication list. --Return in about 6 months (around 10/11/2023) for Follow up atrial fibrillation / flutter. . or sooner if needed. --Continue follow-up with your primary care physician regarding the management of your other chronic comorbid conditions.  Patient's questions and concerns were addressed to his satisfaction. He voices understanding of the instructions provided during this encounter.   This note was created using a voice recognition software as a result there may  be grammatical errors inadvertently enclosed that do not reflect the nature of this encounter. Every attempt is made to correct such errors.  Tessa Lerner, [place], [facility]  Pager:  [phone] Office: [phone]

## 2023-04-16 ENCOUNTER — Other Ambulatory Visit: Payer: Self-pay | Admitting: Cardiology

## 2023-04-16 DIAGNOSIS — I4892 Unspecified atrial flutter: Secondary | ICD-10-CM

## 2023-04-16 NOTE — Telephone Encounter (Signed)
Refill request

## 2023-04-19 ENCOUNTER — Telehealth: Payer: Self-pay | Admitting: Cardiology

## 2023-04-19 NOTE — Telephone Encounter (Signed)
Patient is calling to schedule a follow up with Dr. Odis Hollingshead.   He reports he was advised to schedule within a month of last appt to be seen prior to a heart cath he is wanting.  Based on last OV note it advises for him to follow up ion March.   Patient is requesting a callback for confirmation of what he should schedule.   Please advise.

## 2023-04-19 NOTE — Telephone Encounter (Signed)
Pt asking about his testing that has been ordered at his last OV... he will have labs at his new PCP.[facility] and I sent his orders to them... I will send a message to the [facility] to schedule his Echo and PFT.

## 2023-04-26 ENCOUNTER — Encounter: Payer: Self-pay | Admitting: Nurse Practitioner

## 2023-04-26 ENCOUNTER — Ambulatory Visit (INDEPENDENT_AMBULATORY_CARE_PROVIDER_SITE_OTHER): Payer: Medicare HMO | Admitting: Nurse Practitioner

## 2023-04-26 VITALS — BP 108/70 | HR 70 | Temp 97.8°F | Ht 72.0 in | Wt 206.2 lb

## 2023-04-26 DIAGNOSIS — I4891 Unspecified atrial fibrillation: Secondary | ICD-10-CM | POA: Diagnosis not present

## 2023-04-26 DIAGNOSIS — F1020 Alcohol dependence, uncomplicated: Secondary | ICD-10-CM

## 2023-04-26 DIAGNOSIS — Z125 Encounter for screening for malignant neoplasm of prostate: Secondary | ICD-10-CM

## 2023-04-26 DIAGNOSIS — I8393 Asymptomatic varicose veins of bilateral lower extremities: Secondary | ICD-10-CM | POA: Diagnosis not present

## 2023-04-26 DIAGNOSIS — Z0001 Encounter for general adult medical examination with abnormal findings: Secondary | ICD-10-CM | POA: Insufficient documentation

## 2023-04-26 DIAGNOSIS — Z716 Tobacco abuse counseling: Secondary | ICD-10-CM

## 2023-04-26 DIAGNOSIS — Z1211 Encounter for screening for malignant neoplasm of colon: Secondary | ICD-10-CM

## 2023-04-26 DIAGNOSIS — L600 Ingrowing nail: Secondary | ICD-10-CM

## 2023-04-26 DIAGNOSIS — F17200 Nicotine dependence, unspecified, uncomplicated: Secondary | ICD-10-CM

## 2023-04-26 DIAGNOSIS — Z23 Encounter for immunization: Secondary | ICD-10-CM | POA: Diagnosis not present

## 2023-04-26 DIAGNOSIS — Z833 Family history of diabetes mellitus: Secondary | ICD-10-CM

## 2023-04-26 DIAGNOSIS — Z9889 Other specified postprocedural states: Secondary | ICD-10-CM
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
## 2023-04-27 ENCOUNTER — Other Ambulatory Visit: Payer: Self-pay | Admitting: Nurse Practitioner

## 2023-04-27 DIAGNOSIS — R972 Elevated prostate specific antigen [PSA]: Secondary | ICD-10-CM | POA: Insufficient documentation

## 2023-04-27 LAB — CMP14+EGFR
ALT: 27 [IU]/L (ref 0–44)
AST: 30 [IU]/L (ref 0–40)
Albumin: 3.9 g/dL (ref 3.9–4.9)
Alkaline Phosphatase: 55 [IU]/L (ref 44–121)
BUN/Creatinine Ratio: 14 (ref 10–24)
BUN: 18 mg/dL (ref 8–27)
Bilirubin Total: 0.5 mg/dL (ref 0.0–1.2)
CO2: 24 mmol/L (ref 20–29)
Calcium: 9 mg/dL (ref 8.6–10.2)
Chloride: 101 mmol/L (ref 96–106)
Creatinine, Ser: 1.33 mg/dL — ABNORMAL HIGH (ref 0.76–1.27)
Globulin, Total: 1.9 g/dL (ref 1.5–4.5)
Glucose: 118 mg/dL — ABNORMAL HIGH (ref 70–99)
Potassium: 4.6 mmol/L (ref 3.5–5.2)
Sodium: 139 mmol/L (ref 134–144)
Total Protein: 5.8 g/dL — ABNORMAL LOW (ref 6.0–8.5)
eGFR: 58 mL/min/{1.73_m2} — ABNORMAL LOW (ref >59)

## 2023-04-27 LAB — LIPID PANEL
Chol/HDL Ratio: 2.8 {ratio} (ref 0.0–5.0)
Cholesterol, Total: 172 mg/dL (ref 100–199)
HDL: 62 mg/dL (ref >39)
LDL Chol Calc (NIH): 88 mg/dL (ref 0–99)
Triglycerides: 125 mg/dL (ref 0–149)
VLDL Cholesterol Cal: 22 mg/dL (ref 5–40)

## 2023-04-27 LAB — CBC WITH DIFFERENTIAL/PLATELET
Basophils Absolute: 0 10*3/uL (ref 0.0–0.2)
Basos: 0 %
EOS (ABSOLUTE): 0.1 10*3/uL (ref 0.0–0.4)
Eos: 1 %
Hematocrit: 48.4 % (ref 37.5–51.0)
Hemoglobin: 15.7 g/dL (ref 13.0–17.7)
Immature Grans (Abs): 0 10*3/uL (ref 0.0–0.1)
Immature Granulocytes: 0 %
Lymphocytes Absolute: 1.2 10*3/uL (ref 0.7–3.1)
Lymphs: 24 %
MCH: 33.1 pg — ABNORMAL HIGH (ref 26.6–33.0)
MCHC: 32.4 g/dL (ref 31.5–35.7)
MCV: 102 fL — ABNORMAL HIGH (ref 79–97)
Monocytes Absolute: 0.4 10*3/uL (ref 0.1–0.9)
Monocytes: 9 %
Neutrophils Absolute: 3.4 10*3/uL (ref 1.4–7.0)
Neutrophils: 66 %
Platelets: 146 10*3/uL — ABNORMAL LOW (ref 150–450)
RBC: 4.74 x10E6/uL (ref 4.14–5.80)
RDW: 11.9 % (ref 11.6–15.4)
WBC: 5.2 10*3/uL (ref 3.4–10.8)

## 2023-04-27 LAB — THYROID PANEL WITH TSH
Free Thyroxine Index: 2.6 (ref 1.2–4.9)
T3 Uptake Ratio: 30 % (ref 24–39)
T4, Total: 8.5 ug/dL (ref 4.5–12.0)
TSH: 2.03 u[IU]/mL (ref 0.450–4.500)

## 2023-04-27 LAB — PSA, TOTAL AND FREE
PSA, Free Pct: 12.5 %
PSA, Free: 1.01 ng/mL
Prostate Specific Ag, Serum: 8.1 ng/mL — ABNORMAL HIGH (ref 0.0–4.0)

## 2023-05-07 ENCOUNTER — Ambulatory Visit (HOSPITAL_COMMUNITY)
Admission: RE | Admit: 2023-05-07 | Discharge: 2023-05-07 | Disposition: A | Discharge status: Home / Self Care | Payer: Medicare HMO | Source: Ambulatory Visit | Attending: Cardiology | Admitting: Cardiology

## 2023-05-07 DIAGNOSIS — I4891 Unspecified atrial fibrillation: Secondary | ICD-10-CM | POA: Diagnosis not present

## 2023-05-07 DIAGNOSIS — R9431 Abnormal electrocardiogram [ECG] [EKG]: Secondary | ICD-10-CM | POA: Insufficient documentation

## 2023-05-07 DIAGNOSIS — I350 Nonrheumatic aortic (valve) stenosis: Secondary | ICD-10-CM

## 2023-05-07 LAB — ECHOCARDIOGRAM COMPLETE
AR max vel: 2.2 cm2
AV Area VTI: 2.26 cm2
AV Area mean vel: 2.08 cm2
AV Mean grad: 15 mm[Hg]
AV Peak grad: 25.6 mm[Hg]
Ao pk vel: 2.53 m/s
Area-P 1/2: 5.32 cm2
Calc EF: 34.3 %
S' Lateral: 4 cm
Single Plane A2C EF: 28 %
Single Plane A4C EF: 41.8 %

## 2023-05-07 NOTE — Progress Notes (Signed)
  Echocardiogram 2D Echocardiogram has been performed.  Mike Thomas 05/07/2023, 12:07 PM

## 2023-06-08 ENCOUNTER — Ambulatory Visit (HOSPITAL_COMMUNITY): Payer: Medicare HMO | Attending: Cardiology
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
## 2023-06-11 ENCOUNTER — Encounter: Payer: Self-pay | Admitting: Nurse Practitioner

## 2023-06-11 ENCOUNTER — Ambulatory Visit: Payer: Medicare HMO | Attending: Nurse Practitioner | Admitting: Nurse Practitioner

## 2023-06-11 VITALS — BP 130/82 | HR 107 | Ht 72.0 in | Wt 211.4 lb

## 2023-06-11 DIAGNOSIS — I4892 Unspecified atrial flutter: Secondary | ICD-10-CM | POA: Diagnosis not present

## 2023-06-11 DIAGNOSIS — I5020 Unspecified systolic (congestive) heart failure: Secondary | ICD-10-CM | POA: Diagnosis not present

## 2023-06-11 DIAGNOSIS — Z8679 Personal history of other diseases of the circulatory system: Secondary | ICD-10-CM

## 2023-06-11 DIAGNOSIS — E782 Mixed hyperlipidemia: Secondary | ICD-10-CM | POA: Diagnosis not present

## 2023-06-11 DIAGNOSIS — I350 Nonrheumatic aortic (valve) stenosis: Secondary | ICD-10-CM

## 2023-06-11 DIAGNOSIS — I4891 Unspecified atrial fibrillation: Secondary | ICD-10-CM | POA: Diagnosis not present

## 2023-06-11 MED ORDER — APIXABAN 5 MG PO TABS: 5.0000 mg | ORAL_TABLET | Freq: Two times a day (BID) | ORAL | 3 refills | Status: DC

## 2023-06-11 MED ORDER — METOPROLOL SUCCINATE ER 50 MG PO TB24: 50.0000 mg | ORAL_TABLET | Freq: Every day | ORAL | 2 refills | Status: DC

## 2023-06-11 NOTE — Patient Instructions (Addendum)
Medication Instructions:  INCREASE Toprol XL Take 50mg  take 1 tablet once a day  *If you need a refill on your cardiac medications before your next appointment, please call your pharmacy*   Lab Work: None ordered   Testing/Procedures: Cardiac PET STRESS Test    Follow-Up: At [facility], you and your health needs are our priority.  As part of our continuing mission to provide you with exceptional heart care, we have created designated Provider Care Teams.  These Care Teams include your primary Cardiologist (physician) and Advanced Practice Providers (APPs -  Physician Assistants and Nurse Practitioners) who all work together to provide you with the care you need, when you need it.  We recommend signing up for the patient portal called "MyChart".  Sign up information is provided on this After Visit Summary.  MyChart is used to connect with patients for Virtual Visits (Telemedicine).  Patients are able to view lab/test results, encounter notes, upcoming appointments, etc.  Non-urgent messages can be sent to your provider as well.   To learn more about what you can do with MyChart, go to [email or web address].    Your next appointment:   3 month(s)  Provider:   Tessa Lerner, MD  Other Instructions

## 2023-07-12 ENCOUNTER — Telehealth: Payer: Self-pay | Admitting: Nurse Practitioner

## 2023-07-12 ENCOUNTER — Other Ambulatory Visit: Payer: Self-pay | Admitting: Nurse Practitioner

## 2023-07-12 DIAGNOSIS — R972 Elevated prostate specific antigen [PSA]: Secondary | ICD-10-CM

## 2023-07-12 NOTE — Telephone Encounter (Signed)
Lab order was put in by Dois Davenport, spoke with patient and appointment was scheduled for repeat lab on 07/13/23 at 11 am.

## 2023-07-12 NOTE — Progress Notes (Signed)
Client had labs doe and PSA was elevated, he was referred to urology, but declines that referral and wants to have PSA repeat. Will order it today, and informed client that he will need to make a lab appointment

## 2023-07-12 NOTE — Telephone Encounter (Signed)
Per Urology Referral Notes when called Patient for scheduling - " Pt was not made aware that PSA was high and would like to have another PSA done at PCP before scheduling". 2 Inbasket messages sent to Provider in regards to this information with no response.  Please Advise.

## 2023-07-13 ENCOUNTER — Other Ambulatory Visit: Payer: Medicare HMO

## 2023-07-14 LAB — PSA, TOTAL AND FREE
PSA, Free Pct: 16.6 %
PSA, Free: 1.46 ng/mL
Prostate Specific Ag, Serum: 8.8 ng/mL — ABNORMAL HIGH (ref 0.0–4.0)

## 2023-08-18 NOTE — Progress Notes (Unsigned)
Established Patient Office Visit  Subjective  [name], male    DOB: 1950-10-03  Age: 73 y.o. MRN: [id number]  Chief Complaint  Patient presents with   Medical Management of Chronic Issues    4 month    HPI Mike Thomas is a 73 yrs old male present 08/25/2023 for a 4-months f/u for chronic diseases, and medications refills and concerns for left hand contracture  Left duputen contractures new problem: 73 year old male presents with complaints of new onset  left hand Dupuytren's contracture. He reports difficulty playing guitar due to the contracture and notes that the symptoms are similar to a prior episode o the right hand, for which he underwent a procedure in the past. The patient desires to wait before pursuing further treatment.  Abnormal PSA Has labs done 21-month ago and PSA was abnormal.  Currently being managed with tamsulosin 0.4 mg daily.  Reports up 3-4 times at night to use the bathroom.  He was referred to urology for further assessment however he went to: For the appointment he said due to transportation issue he was not able to make it.  On referral be sent to urology for  Insomnia: Well-controlled on trazodone getting 7-8 hrs of sleep.  Denies any side effect from the medication and requested to have it refilled .  GERD, Follow up:  The patient was last seen for GERD 4 months ago. Changes made since that visit include Pantoprazole 40 mg.  He reports excellent compliance with treatment. He is not having side effects. Marland Kitchen  He IS experiencing heartburn. He is NOT experiencing belching, chest pain, cough, fullness after meals, or heartburn  Lipid/Cholesterol, Follow-up  Last lipid panel Other pertinent labs  Lab Results  Component Value Date   CHOL 172 04/26/2023   HDL 62 04/26/2023   LDLCALC 88 04/26/2023   TRIG 125 04/26/2023   CHOLHDL 2.8 04/26/2023   Lab Results  Component Value Date   ALT 27 04/26/2023   AST 30 04/26/2023   PLT 146 (L)  04/26/2023   TSH 2.030 04/26/2023     He was last seen for this 4 months ago.  Management since that visit includes lipitor 20 mg .  He reports excellent compliance with treatment. He is not having side effects.   Symptoms: No chest pain No chest pressure/discomfort  No dyspnea No lower extremity edema  No numbness or tingling of extremity No orthopnea  No palpitations No paroxysmal nocturnal dyspnea  No speech difficulty No syncope   Current diet: in general, an "unhealthy" diet Current exercise: none  The 10-year ASCVD risk score (Arnett DK, et al., 2019) is: 17.8%  Hypertension, follow-up  BP Readings from Last 3 Encounters:  08/25/23 114/81  06/11/23 130/82  04/26/23 108/70   Wt Readings from Last 3 Encounters:  08/25/23 209 lb 6.4 oz (95 kg)  06/11/23 211 lb 6.4 oz (95.9 kg)  04/26/23 206 lb 3.2 oz (93.5 kg)     He was last seen for hypertension 4 months ago.  BP at that visit was 130/82. Management since that visit includes metoprolol 50 mg daily . He reports excellent compliance with treatment. He is not having side effects.  He is following a Regular diet. He is not exercising. He does not smoke.  Use of agents associated with hypertension: none.   Outside blood pressures are being monitored. Symptoms: No chest pain No chest pressure  No palpitations No syncope  No dyspnea No orthopnea  No paroxysmal nocturnal  dyspnea No lower extremity edema   Pertinent labs Lab Results  Component Value Date   CHOL 172 04/26/2023   HDL 62 04/26/2023   LDLCALC 88 04/26/2023   TRIG 125 04/26/2023   CHOLHDL 2.8 04/26/2023   Lab Results  Component Value Date   NA 139 04/26/2023   K 4.6 04/26/2023   CREATININE 1.33 (H) 04/26/2023   EGFR 58 (L) 04/26/2023   GLUCOSE 118 (H) 04/26/2023   TSH 2.030 04/26/2023     The 10-year ASCVD risk score (Arnett DK, et al., 2019) is: 17.8%  B-12 deficiency 73 year old male with a history of vitamin B12 deficiency, currently  being managed with 1000 mcg of cyanocobalamin. Patient reports improvement in symptoms, including any prior fatigue or neurological issues related to B12 deficiency. He has been compliant with his treatment regimen, and there are no new concerns or side effects noted. Given the improvement, the plan is to continue his current dose of cyanocobalamin. Monitoring for any potential recurrence of symptoms or complications will continue as part of follow-up care. No other significant health issues reported at this time.  Patient Active Problem List   Diagnosis Date Noted   Mixed hyperlipidemia 08/25/2023   Insomnia 08/25/2023   Gastroesophageal reflux disease without esophagitis 08/25/2023   Primary hypertension 08/25/2023   Abnormal prostate specific antigen (PSA) 04/27/2023   Family history of diabetes mellitus in brother 04/26/2023   Ingrowing right great toenail 04/26/2023   Encounter for general adult medical examination with abnormal findings 04/26/2023   Uncomplicated alcohol dependence (HCC) 04/26/2023   Encounter for smoking cessation counseling 04/26/2023   AKI (acute kidney injury) (HCC)    Urine culture positive    Hyponatremia    Drug induced constipation    Urinary retention    Vascular headache    Bilateral subdural hematomas (HCC) 10/03/2020   HFrEF (heart failure with reduced ejection fraction) (HCC)    Atrial fibrillation (HCC)    SDH (subdural hematoma) (HCC) 09/27/2020   S/P craniotomy 09/27/2020   Subdural hematoma (HCC) 09/25/2020   Newly recognized heart murmur 12/26/2014   Elevated blood pressure 12/26/2014   Dupuytren's contracture 12/26/2014   Past Medical History:  Diagnosis Date   AKI (acute kidney injury) (HCC)    Allergy    Headache    Hx of migraines    Hyperlipidemia    Hypertension    Past Surgical History:  Procedure Laterality Date   CRANIOTOMY Bilateral 09/27/2020   Procedure: BILATERAL CRANIOTOMY FOR SUBDURAL HEMATOMA EVACUATION;  Surgeon: Tia Alert, MD;  Location: [facility] OR;  Service: Neurosurgery;  Laterality: Bilateral;   KNEE SURGERY Left 1971   TONSILLECTOMY     Social History   Tobacco Use   Smoking status: Every Day    Current packs/day: 0.50    Average packs/day: 0.5 packs/day for 30.0 years (15.0 ttl pk-yrs)    Types: Cigarettes   Smokeless tobacco: Never  Vaping Use   Vaping status: Never Used  Substance Use Topics   Alcohol use: Yes    Alcohol/week: 20.0 standard drinks of alcohol    Types: 20 Glasses of wine per week   Drug use: No   Social History   Socioeconomic History   Marital status: Divorced    Spouse name: Not on file   Number of children: 2   Years of education: Not on file   Highest education level: Not on file  Occupational History   Not on file  Tobacco Use   Smoking status:  Every Day    Current packs/day: 0.50    Average packs/day: 0.5 packs/day for 30.0 years (15.0 ttl pk-yrs)    Types: Cigarettes   Smokeless tobacco: Never  Vaping Use   Vaping status: Never Used  Substance and Sexual Activity   Alcohol use: Yes    Alcohol/week: 20.0 standard drinks of alcohol    Types: 20 Glasses of wine per week   Drug use: No   Sexual activity: Not Currently  Other Topics Concern   Not on file  Social History Narrative   Not on file   Social Drivers of Health   Financial Resource Strain: Low Risk  (09/24/2022)   Received from [facility], [facility]   Overall Financial Resource Strain (CARDIA)    Difficulty of Paying Living Expenses: Not very hard  Food Insecurity: No Food Insecurity (09/24/2022)   Received from [facility], [facility]   Hunger Vital Sign    Worried About Running Out of Food in the Last Year: Never true    Ran Out of Food in the Last Year: Never true  Transportation Needs: No Transportation Needs (09/24/2022)   Received from [facility], [facility]   PRAPARE - Transportation    Lack of Transportation (Medical): No    Lack of Transportation (Non-Medical):  No  Physical Activity: Unknown (09/24/2022)   Received from [facility], [facility]   Exercise Vital Sign    Days of Exercise per Week: 0 days    Minutes of Exercise per Session: Not on file  Stress: Stress Concern Present (09/24/2022)   Received from [facility], [facility] - Occupational Stress Questionnaire    Feeling of Stress : Rather much  Social Connections: Socially Isolated (09/24/2022)   Received from [facility], [facility]   Social Network    How would you rate your social network (family, work, friends)?: Little participation, lonely and socially isolated  Intimate Partner Violence: Not At Risk (09/24/2022)   Received from [facility], [facility]   HITS    Over the last 12 months how often did your partner physically hurt you?: Never    Over the last 12 months how often did your partner insult you or talk down to you?: Never    Over the last 12 months how often did your partner threaten you with physical harm?: Never    Over the last 12 months how often did your partner scream or curse at you?: Never   Family Status  Relation Name Status   Mother  Deceased at age 20   Father  Deceased at age 33   Sister  Alive   Brother  Alive   Brother  Alive   Neg Hx  (Not Specified)  No partnership data on file   Family History  Problem Relation Age of Onset   Cancer Mother 73   Colon cancer Mother 46       died age 19   Cancer Father 14       leukemia   Cancer Sister        breast   Diabetes Brother    Cancer Brother 86       prostate   Stomach cancer Brother        thinks dx age 59   Rectal cancer Neg Hx    Prostate cancer Neg Hx    Esophageal cancer Neg Hx    Liver cancer Neg Hx    Pancreatic cancer  Neg Hx    No Known Allergies    ROS Negative unless indicated in HPI   Objective:     BP 114/81   Pulse 87   Temp 97.6 F (36.4 C) (Temporal)   Ht 6' (1.829 m)   Wt 209 lb 6.4 oz (95 kg)   SpO2  98%   BMI 28.40 kg/m  BP Readings from Last 3 Encounters:  08/25/23 114/81  06/11/23 130/82  04/26/23 108/70   Wt Readings from Last 3 Encounters:  08/25/23 209 lb 6.4 oz (95 kg)  06/11/23 211 lb 6.4 oz (95.9 kg)  04/26/23 206 lb 3.2 oz (93.5 kg)      Physical Exam Vitals and nursing note reviewed.  Constitutional:      General: He is not in acute distress.    Appearance: Normal appearance.  HENT:     Head: Normocephalic and atraumatic.     Nose: Nose normal.     Mouth/Throat:     Mouth: Mucous membranes are moist.  Eyes:     General: No scleral icterus.    Extraocular Movements: Extraocular movements intact.     Conjunctiva/sclera: Conjunctivae normal.     Pupils: Pupils are equal, round, and reactive to light.  Neck:     Vascular: No carotid bruit.  Cardiovascular:     Rate and Rhythm: Normal rate and regular rhythm.  Abdominal:     General: Bowel sounds are normal.     Palpations: Abdomen is soft.  Musculoskeletal:     Left hand: Decreased range of motion.     Cervical back: Normal range of motion and neck supple. No rigidity or tenderness.     Right lower leg: No edema.     Left lower leg: No edema.     Comments: Contractures   Lymphadenopathy:     Cervical: No cervical adenopathy.  Skin:    General: Skin is warm and dry.     Findings: No rash.  Neurological:     Mental Status: He is alert and oriented to person, place, and time. Mental status is at baseline.    No results found for any visits on 08/25/23.  Last CBC Lab Results  Component Value Date   WBC 5.2 04/26/2023   HGB 15.7 04/26/2023   HCT 48.4 04/26/2023   MCV 102 (H) 04/26/2023   MCH 33.1 (H) 04/26/2023   RDW 11.9 04/26/2023   PLT 146 (L) 04/26/2023   Last metabolic panel Lab Results  Component Value Date   GLUCOSE 118 (H) 04/26/2023   NA 139 04/26/2023   K 4.6 04/26/2023   CL 101 04/26/2023   CO2 24 04/26/2023   BUN 18 04/26/2023   CREATININE 1.33 (H) 04/26/2023   EGFR 58 (L)  04/26/2023   CALCIUM 9.0 04/26/2023   PROT 5.8 (L) 04/26/2023   ALBUMIN 3.9 04/26/2023   LABGLOB 1.9 04/26/2023   BILITOT 0.5 04/26/2023   ALKPHOS 55 04/26/2023   AST 30 04/26/2023   ALT 27 04/26/2023   ANIONGAP 6 10/21/2020   Last lipids Lab Results  Component Value Date   CHOL 172 04/26/2023   HDL 62 04/26/2023   LDLCALC 88 04/26/2023   TRIG 125 04/26/2023   CHOLHDL 2.8 04/26/2023   Last hemoglobin A1c No results found for: "HGBA1C" Last thyroid functions Lab Results  Component Value Date   TSH 2.030 04/26/2023   T4TOTAL 8.5 04/26/2023        Assessment & Plan:  Dupuytren contracture of left hand  Primary hypertension -     Metoprolol Succinate ER; Take 1 tablet (50 mg total) by mouth daily.  Dispense: 90 tablet; Refill: 1  Gastroesophageal reflux disease without esophagitis -     Pantoprazole Sodium; Take 1 tablet (40 mg total) by mouth at bedtime.  Dispense: 90 tablet; Refill: 1  Mixed hyperlipidemia -     Atorvastatin Calcium; Take 1 tablet (20 mg total) by mouth daily.  Dispense: 90 tablet; Refill: 1  Abnormal prostate specific antigen (PSA) -     Ambulatory referral to Urology -     Tamsulosin HCl; Take 1 capsule (0.4 mg total) by mouth daily after supper.  Dispense: 90 capsule; Refill: 1  Insomnia, unspecified type -     traZODone HCl; Take 1 tablet (50 mg total) by mouth at bedtime as needed for sleep.  Dispense: 90 tablet; Refill: 1  B12 deficiency -     Vitamin B-12; Take 1 tablet (1,000 mcg total) by mouth daily.  Dispense: 90 tablet; Refill: 1   Candler a 73 year old Caucasian male seen today for chronic disease management, no acute distress   Abnormal PSA: Unknown referral sent to urology hide is instructed to follow up with them  GERD: Well-controlled with pantoprazole 40 mg daily, refill provided.    B-12 deficiency: continue B121000 mcg, refill provided  HTN: well controled with metoprolol 50 mg daily, refill provided  Hyperlipidemia:  Well-controlled with atorvastatin 40 mg refill provided  Insomnia: well-controlled with trazodone 50 mg as needed, refill provided  BPH: No referral sent to urology due to abnormal PSA, continue tamsulosin 0.4 mg daily refill provided Dupuytren contracture left hand: Client wants to wait for referral, continue current prophylactic treatment at home. encourage ealthy lifestyle choices, including diet (rich in fruits, vegetables, and lean proteins, and low in salt and simple carbohydrates) and exercise (at least 30 minutes of moderate physical activity daily).     The above assessment and management plan was discussed with the patient. The patient verbalized understanding of and has agreed to the management plan. Patient is aware to call the clinic if they develop any new symptoms or if symptoms persist or worsen. Patient is aware when to return to the clinic for a follow-up visit. Patient educated on when it is appropriate to go to the emergency department.  Return in about 6 months (around 02/22/2024) for chronic diseases management.    Arrie Aran Santa Lighter, [facility] [address] [place], [place] [address] [phone]    Note: This document was prepared by [employer] voice dictation technology and any errors that results from this process are unintentional.

## 2023-08-25 ENCOUNTER — Encounter: Payer: Self-pay | Admitting: Nurse Practitioner

## 2023-08-25 ENCOUNTER — Ambulatory Visit (INDEPENDENT_AMBULATORY_CARE_PROVIDER_SITE_OTHER): Payer: 59 | Admitting: Nurse Practitioner

## 2023-08-25 VITALS — BP 114/81 | HR 87 | Temp 97.6°F | Ht 72.0 in | Wt 209.4 lb

## 2023-08-25 DIAGNOSIS — K219 Gastro-esophageal reflux disease without esophagitis: Secondary | ICD-10-CM

## 2023-08-25 DIAGNOSIS — E782 Mixed hyperlipidemia: Secondary | ICD-10-CM

## 2023-08-25 DIAGNOSIS — I10 Essential (primary) hypertension: Secondary | ICD-10-CM | POA: Diagnosis not present

## 2023-08-25 DIAGNOSIS — G4700 Insomnia, unspecified: Secondary | ICD-10-CM

## 2023-08-25 DIAGNOSIS — M720 Palmar fascial fibromatosis [Dupuytren]: Secondary | ICD-10-CM | POA: Diagnosis not present

## 2023-08-25 DIAGNOSIS — R972 Elevated prostate specific antigen [PSA]: Secondary | ICD-10-CM

## 2023-08-25 DIAGNOSIS — E538 Deficiency of other specified B group vitamins: Secondary | ICD-10-CM

## 2023-08-25 MED ORDER — VITAMIN B-12 1000 MCG PO TABS
1000.0000 ug | ORAL_TABLET | Freq: Every day | ORAL | 1 refills | Status: AC
Start: 2023-08-25 — End: ?

## 2023-08-25 MED ORDER — TRAZODONE HCL 50 MG PO TABS: 50.0000 mg | ORAL_TABLET | Freq: Every evening | ORAL | 1 refills | Status: DC | PRN

## 2023-08-25 MED ORDER — ATORVASTATIN CALCIUM 20 MG PO TABS: 20.0000 mg | ORAL_TABLET | Freq: Every day | ORAL | 1 refills | Status: DC

## 2023-08-25 MED ORDER — TAMSULOSIN HCL 0.4 MG PO CAPS: 0.4000 mg | ORAL_CAPSULE | Freq: Every day | ORAL | 1 refills | Status: DC

## 2023-08-25 MED ORDER — PANTOPRAZOLE SODIUM 40 MG PO TBEC
40.0000 mg | DELAYED_RELEASE_TABLET | Freq: Every day | ORAL | 1 refills | Status: DC
Start: 2023-08-25 — End: 2024-03-17

## 2023-08-25 MED ORDER — METOPROLOL SUCCINATE ER 50 MG PO TB24: 50.0000 mg | ORAL_TABLET | Freq: Every day | ORAL | 1 refills | Status: DC

## 2023-08-26 DIAGNOSIS — M79676 Pain in unspecified toe(s): Secondary | ICD-10-CM | POA: Diagnosis not present

## 2023-08-26 DIAGNOSIS — B351 Tinea unguium: Secondary | ICD-10-CM | POA: Diagnosis not present

## 2023-08-31 ENCOUNTER — Ambulatory Visit: Payer: Medicare HMO | Admitting: Cardiology

## 2023-09-24 ENCOUNTER — Ambulatory Visit: Payer: 59 | Admitting: Urology

## 2023-10-09 ENCOUNTER — Other Ambulatory Visit: Payer: Self-pay | Admitting: Nurse Practitioner

## 2023-10-11 NOTE — Telephone Encounter (Signed)
 Prescription refill request for Eliquis received. Indication:aflutter Last office visit:11/24 Scr:1.33  9/24 Age: 73 Weight:95  kg  Prescription refilled

## 2023-10-14 ENCOUNTER — Ambulatory Visit: Payer: Self-pay | Admitting: Cardiology

## 2023-10-29 ENCOUNTER — Ambulatory Visit: Payer: 59 | Admitting: Urology

## 2023-10-29 DIAGNOSIS — R972 Elevated prostate specific antigen [PSA]: Secondary | ICD-10-CM

## 2023-11-15 ENCOUNTER — Ambulatory Visit: Attending: Cardiology | Admitting: Cardiology

## 2023-11-15 ENCOUNTER — Encounter: Payer: Self-pay | Admitting: Cardiology

## 2023-11-15 VITALS — BP 110/80 | HR 74 | Resp 16 | Ht 72.0 in | Wt 205.0 lb

## 2023-11-15 DIAGNOSIS — I4892 Unspecified atrial flutter: Secondary | ICD-10-CM

## 2023-11-15 DIAGNOSIS — I10 Essential (primary) hypertension: Secondary | ICD-10-CM | POA: Diagnosis not present

## 2023-11-15 DIAGNOSIS — I4891 Unspecified atrial fibrillation: Secondary | ICD-10-CM

## 2023-11-15 DIAGNOSIS — I429 Cardiomyopathy, unspecified: Secondary | ICD-10-CM

## 2023-11-15 DIAGNOSIS — Z7901 Long term (current) use of anticoagulants: Secondary | ICD-10-CM | POA: Diagnosis not present

## 2023-11-15 DIAGNOSIS — E782 Mixed hyperlipidemia: Secondary | ICD-10-CM | POA: Diagnosis not present

## 2023-11-15 DIAGNOSIS — I350 Nonrheumatic aortic (valve) stenosis: Secondary | ICD-10-CM | POA: Diagnosis not present

## 2023-11-15 MED ORDER — LOSARTAN POTASSIUM 25 MG PO TABS
25.0000 mg | ORAL_TABLET | Freq: Every day | ORAL | 3 refills | Status: AC
Start: 2023-11-15 — End: 2024-03-30

## 2023-11-15 NOTE — Progress Notes (Signed)
 Cardiology Office Note:  .   Date:  11/15/2023  ID:  Mike Thomas, DOB 06/02/1951, MRN [id number] PCP:  Anton Baton, NP  Former Cardiology Providers: Dr. Fransico Ivy, [facility], [place]  [facility] Providers Cardiologist:  Olinda Bertrand, DO , [facility] (established care 10/05/2022 ) Electrophysiologist:  None  Click to update primary MD,subspecialty MD or APP then REFRESH:1}    Chief Complaint  Patient presents with  . Atrial fibrillation and flutter   . Follow-up    3 months    History of Present Illness: .   Mike Thomas is a 73 y.o. Caucasian male whose past medical history and cardiovascular risk factors includes: Hypertension, hyperlipidemia, history of persistent atrial fibrillation /flutter, cardiomyopathy, documented history of history of low-flow low gradient aortic stenosis, status post fall in March 2022 requiring bilateral craniotomy for subdural evacuation, smoker (3 cigarette /day).   In March 2022 after mechanical fall he underwent bilateral craniotomy due to subdural hematoma.  During that hospitalization he was found to be in A-fib with RVR and underwent rate control strategy.  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  Repeat echocardiogram noted improvement in LVEF.  MPI as of 2022 noted concerns for reversible ischemia and he was recommended to undergo angiography which he has postponed for more than a year and since he remained asymptomatic he wanted to proceed with medical therapy.   At the last office visit he was noted to be having atrial flutter with rapid ventricular rate and therefore was recommended to undergo TEE guided cardioversion.  However patient refused as he takes care of 2 sisters (35 is 16 years old with CLL and the other sister has colon cancer with broken hips).    Patient has been on antiarrhythmic medications for some time and on multiple office visits has  been educated on importance of monitoring for side effect profile with labs, x-ray, breathing test, and eye exams regularly.  However, PFTs and labs still remain pending and therefore discontinued amiodarone  (for concerns for safety / monitoring side effect profile).  Patient followed up with [facility] nurse practitioner back in November 2024 and  was recommended to undergo ischemic workup given the reduction in LVEF.  They discussed either nuclear stress test or cardiac PET/CT and patient wanted to research the different modalities prior to making a decision.    Since last office visit patient denies any anginal chest pain or heart failure symptoms.  No hospitalizations or urgent care visits for cardiovascular reasons.  Patient states that he would like to hold off on stress testing at this time.  He also wants to hold off on considering cardioversion to maintain sinus rhythm despite knowing that he has mildly reduced LVEF.  In the past he was noted to have aortic stenosis but clinically denies anginal chest pain, heart failure symptoms, near-syncope or syncopal events.  Review of Systems: .   Review of Systems  Cardiovascular:  Negative for chest pain, claudication, irregular heartbeat, leg swelling, near-syncope, orthopnea, palpitations, paroxysmal nocturnal dyspnea and syncope.  Respiratory:  Negative for shortness of breath.   Hematologic/Lymphatic: Negative for bleeding problem.    Studies Reviewed:   EKG: EKG Interpretation Date/Time:  Monday November 15 2023 10:59:34 EDT Ventricular Rate:  116 PR Interval:    QRS Duration:  88 QT Interval:  344 QTC Calculation: 478 R Axis:   270  Text Interpretation: Atrial fibrillation CONTROLLED VENTRICULAR RESPONSE with premature  ventricular or aberrantly conducted complexes Inferior infarct , age undetermined Anterolateral infarct , age undetermined When compared with ECG of 25-Sep-2020 09:24, Since last tracing rate slower Confirmed by Olinda Bertrand [phone]) on 11/15/2023 11:37:22 AM  Echocardiogram: 09/2020: LVEF 25-30%, severe global hypokinesis. 05/2021: LVEF 55%, grade 1 diastolic dysfunction, likely bicuspid aortic valve, moderate aortic stenosis dimensional index 0.31, mild AR, mild MR 05/07/2023: EF of 40-45% with global hypokinesis and low normal RV systolic pressure with mildly dilated RA/LA and trivial MR   Stress Testing: Exercise/Lexiscan Tetrofosmin stress test 05/28/2021: Exercise nuclear stress test was performed using Bruce protocol. Patient reached 4.6 METS, and 71% of age predicted maximum heart rate. In addition, patient was given IV Lexican. Exercise capacity was low. No chest pain reported. Dyspnea reported. Heart rate and hemodynamic response were normal. Rest and stress EKG showed sinus rhythm, low voltage, old anterolateral infarct, no acute ischemic changes. SPECT images showed medium sized, moderate intensity, reversible perfusion defect in apical inferoseptal, apical to basal anterior/anteroseptal myocardium, with associate decreased myocardial thickening and wall motion. Stress LVEF 35%. High risk study.  RADIOLOGY: NA  Risk Assessment/Calculations:   Click Here to Calculate/Change CHADS2VASc Score The patient's CHADS2-VASc score is 5, indicating a 7.2% annual risk of stroke.   CHF History: No HTN History: Yes Diabetes History: No Stroke History: Yes Vascular Disease History: Yes  Labs:       Latest Ref Rng & Units 04/26/2023    9:42 AM 10/21/2020    7:43 AM 10/13/2020    4:44 AM  CBC  WBC 3.4 - 10.8 x10E3/uL 5.2  4.3  7.9   Hemoglobin 13.0 - 17.7 g/dL 30.8  65.7  84.6   Hematocrit 37.5 - 51.0 % 48.4  46.6  42.6   Platelets 150 - 450 x10E3/uL 146  151  185        Latest Ref Rng & Units 04/26/2023    9:42 AM 10/21/2020    7:43 AM 10/18/2020    5:05 AM  BMP  Glucose 70 - 99 mg/dL 962  952  841   BUN 8 - 27 mg/dL 18  16  20    Creatinine 0.76 - 1.27 mg/dL 3.24  4.01  0.27   BUN/Creat Ratio 10  - 24 14     Sodium 134 - 144 mmol/L 139  134  133   Potassium 3.5 - 5.2 mmol/L 4.6  4.0  4.7   Chloride 96 - 106 mmol/L 101  102  101   CO2 20 - 29 mmol/L 24  26  25    Calcium  8.6 - 10.2 mg/dL 9.0  9.1  9.1       Latest Ref Rng & Units 04/26/2023    9:42 AM 10/21/2020    7:43 AM 10/18/2020    5:05 AM  CMP  Glucose 70 - 99 mg/dL 253  664  403   BUN 8 - 27 mg/dL 18  16  20    Creatinine 0.76 - 1.27 mg/dL 4.74  2.59  5.63   Sodium 134 - 144 mmol/L 139  134  133   Potassium 3.5 - 5.2 mmol/L 4.6  4.0  4.7   Chloride 96 - 106 mmol/L 101  102  101   CO2 20 - 29 mmol/L 24  26  25    Calcium  8.6 - 10.2 mg/dL 9.0  9.1  9.1   Total Protein 6.0 - 8.5 g/dL 5.8     Total Bilirubin 0.0 - 1.2 mg/dL 0.5  Alkaline Phos 44 - 121 IU/L 55     AST 0 - 40 IU/L 30     ALT 0 - 44 IU/L 27       Lab Results  Component Value Date   CHOL 172 04/26/2023   HDL 62 04/26/2023   LDLCALC 88 04/26/2023   TRIG 125 04/26/2023   CHOLHDL 2.8 04/26/2023   No results for input(s): "LIPOA" in the last 8760 hours. No components found for: "NTPROBNP" No results for input(s): "PROBNP" in the last 8760 hours. Recent Labs    04/26/23 0942  TSH 2.030    Physical Exam:    Today's Vitals   11/15/23 1056  BP: 110/80  Pulse: 74  Resp: 16  SpO2: 97%  Weight: 205 lb (93 kg)  Height: 6' (1.829 m)   Body mass index is 27.8 kg/m. Wt Readings from Last 3 Encounters:  11/15/23 205 lb (93 kg)  08/25/23 209 lb 6.4 oz (95 kg)  06/11/23 211 lb 6.4 oz (95.9 kg)    Physical Exam  Constitutional: No distress.  Age appropriate, hemodynamically stable.   Neck: No JVD present.  Cardiovascular: Normal rate, S1 normal, S2 normal, intact distal pulses and normal pulses. An irregularly irregular rhythm present. Exam reveals no gallop, no S3 and no S4.  Murmur heard. Crescendo-decrescendo systolic murmur is present with a grade of 3/6 at the upper right sternal border. Pulmonary/Chest: Effort normal and breath sounds  normal. No stridor. He has no wheezes. He has no rales.  Abdominal: Soft. Bowel sounds are normal. He exhibits no distension. There is no abdominal tenderness.  Musculoskeletal:        General: No edema.     Cervical back: Neck supple.  Neurological: He is alert and oriented to person, place, and time. He has intact cranial nerves (2-12).  Skin: Skin is warm and moist.    Impression & Recommendation(s):  Impression:   ICD-10-CM   1. Atrial fibrillation and flutter (HCC)  I48.91 EKG 12-Lead   I48.92 AMB Referral to [facility] Pharm-D    Hemoglobin and hematocrit, blood    Hemoglobin and hematocrit, blood    2. Long term (current) use of anticoagulants  Z79.01 Hemoglobin and hematocrit, blood    Hemoglobin and hematocrit, blood    3. Nonrheumatic aortic valve stenosis  I35.0 ECHOCARDIOGRAM COMPLETE    4. Cardiomyopathy, unspecified type (HCC)  I42.9     5. Benign hypertension  I10 Basic metabolic panel with GFR    losartan  (COZAAR ) 25 MG tablet    Basic metabolic panel with GFR    6. Mixed hyperlipidemia  E78.2        Recommendation(s):  Atrial fibrillation and flutter (HCC) Persistent Rate control: Toprol -XL. Rhythm control: N/A. Thromboembolic prophylaxis: Eliquis  Was on amiodarone  in the past and but failed to follow-up with monitoring side effect profile with testing and therefore medication was discontinued more for concerns for safety. Most recent echocardiogram notes mildly reduced LVEF likely secondary to episodes of RVR but ischemic burden cannot be ruled out given his stress test back in November 2022. We discussed considering TEE guided cardioversion as he has skipped a few doses of anticoagulation.  However he would like to hold off for now mostly because of transportation issues and social determinants of health (takes care of his family members).  Long term (current) use of anticoagulants Currently on Eliquis  5 mg p.o. twice daily. Denies evidence of  bleeding. Risks, and alternatives to oral anticoagulation discussed. Most recent hemoglobin from  September 2024 15.7 g/dL. Will check H&H  Nonrheumatic aortic valve stenosis Felt to have bicuspid aortic valve but transthoracic images have been suboptimal in the past. Given the mild reduction in LVEF cannot rule out low-flow low gradient AS Clinically asymptomatic Will repeat echocardiogram in October 2025 to reevaluate disease progression  Cardiomyopathy, unspecified type (HCC) Multifactorial: Tachycardia mediated, valvular heart disease, ischemic burden given his prior nuclear stress test results, etc. In the past I have offered him to undergo heart catheterization but he has denied.   Recent echo has noted reduction in LVEF so we have also discussed noninvasive testing such as repeat stress test or cardiac PET/CT given the reduction in LVEF with the patient still remains reluctant. I spoke to him that my concern is he has poor insight into his overall health condition and may come point in time these comorbidities start impacting his day-to-day activity level and have to worsening morbidity and mortality.  He is more than welcome to seek a second opinion. Will focus on up titration of GDMT. Continue Toprol -XL 50 mg p.o. daily. Will start losartan  25 mg p.o. every afternoon.  BMP in one week to check renal function and electrolytes Will refer him to Pharm.D. clinic for uptitration of GDMT  Benign hypertension Office blood pressures are well-controlled. See medication changes as mentioned above  Mixed hyperlipidemia Continue Lipitor 20 mg p.o. nightly   Orders Placed:  Orders Placed This Encounter  Procedures  . Basic metabolic panel with GFR    Standing Status:   Future    Number of Occurrences:   1    Expected Date:   11/22/2023    Expiration Date:   11/14/2024  . Hemoglobin and hematocrit, blood    Standing Status:   Future    Number of Occurrences:   1    Expected Date:    11/22/2023    Expiration Date:   11/14/2024  . AMB Referral to [facility] Pharm-D    Referral Priority:   Routine    Referral Type:   Consultation    Referral Reason:   Specialty Services Required    Number of Visits Requested:   1  . EKG 12-Lead  . ECHOCARDIOGRAM COMPLETE    Standing Status:   Future    Expected Date:   04/26/2024    Expiration Date:   11/14/2024    Where should this test be performed:   [facility])    Does the patient weigh less than or greater than 250 lbs?:   Patient weighs less than 250 lbs    Perflutren  DEFINITY  (image enhancing agent) should be administered unless hypersensitivity or allergy exist:   Administer Perflutren     Reason for exam-Echo:   Other-Full Diagnosis List    Full ICD-10/Reason for Exam:   Aortic stenosis [161096]     Final Medication List:    Meds ordered this encounter  Medications  . losartan  (COZAAR ) 25 MG tablet    Sig: Take 1 tablet (25 mg total) by mouth daily.    Dispense:  90 tablet    Refill:  3    Medications Discontinued During This Encounter  Medication Reason  . amiodarone  (PACERONE ) 200 MG tablet Patient Preference     Current Outpatient Medications:  .  acetaminophen  (TYLENOL ) 325 MG tablet, Take 1-2 tablets (325-650 mg total) by mouth every 4 (four) hours as needed for mild pain., Disp: , Rfl:  .  atorvastatin  (LIPITOR) 20 MG tablet, Take 1 tablet (20  mg total) by mouth daily., Disp: 90 tablet, Rfl: 1 .  cyanocobalamin (VITAMIN B12) 1000 MCG tablet, Take 1 tablet (1,000 mcg total) by mouth daily., Disp: 90 tablet, Rfl: 1 .  ELIQUIS  5 MG TABS tablet, TAKE ONE TABLET BY MOUTH TWICE DAILY, Disp: 60 tablet, Rfl: 3 .  losartan  (COZAAR ) 25 MG tablet, Take 1 tablet (25 mg total) by mouth daily., Disp: 90 tablet, Rfl: 3 .  metoprolol  succinate (TOPROL -XL) 50 MG 24 hr tablet, Take 1 tablet (50 mg total) by mouth daily., Disp: 90 tablet, Rfl: 1 .  pantoprazole  (PROTONIX ) 40 MG tablet, Take 1 tablet (40 mg  total) by mouth at bedtime., Disp: 90 tablet, Rfl: 1 .  tamsulosin  (FLOMAX ) 0.4 MG CAPS capsule, Take 1 capsule (0.4 mg total) by mouth daily after supper., Disp: 90 capsule, Rfl: 1 .  traZODone  (DESYREL ) 50 MG tablet, Take 1 tablet (50 mg total) by mouth at bedtime as needed for sleep., Disp: 90 tablet, Rfl: 1  Consent:   NA  Disposition:   November 2025  His questions and concerns were addressed to his satisfaction. He voices understanding of the recommendations provided during this encounter.    Signed, Olinda Bertrand, DO, [facility]   [facility]  [address] [place], [place] [address] 11/15/2023 1:29 PM

## 2023-11-15 NOTE — Patient Instructions (Addendum)
 Medication Instructions:  Your physician has recommended you make the following change in your medication:   START Losartan  25 mg once daily in the evening.   *If you need a refill on your cardiac medications before your next appointment, please call your pharmacy*  Lab Work: To be completed in 1 week: BMP and hemoglobin/ hematocrit   If you have labs (blood work) drawn today and your tests are completely normal, you will receive your results only by: MyChart Message (if you have MyChart) OR A paper copy in the mail If you have any lab test that is abnormal or we need to change your treatment, we will call you to review the results.  Testing/Procedures: Your physician has requested that you have an echocardiogram in October 2025. Echocardiography is a painless test that uses sound waves to create images of your heart. It provides your doctor with information about the size and shape of your heart and how well your heart's chambers and valves are working. This procedure takes approximately one hour. There are no restrictions for this procedure. Please do NOT wear cologne, perfume, aftershave, or lotions (deodorant is allowed). Please arrive 15 minutes prior to your appointment time.  Please note: We ask at that you not bring children with you during ultrasound (echo/ vascular) testing. Due to room size and safety concerns, children are not allowed in the ultrasound rooms during exams. Our front office staff cannot provide observation of children in our lobby area while testing is being conducted. An adult accompanying a patient to their appointment will only be allowed in the ultrasound room at the discretion of the ultrasound technician under special circumstances. We apologize for any inconvenience.   Follow-Up: At [facility], you and your health needs are our priority.  As part of our continuing mission to provide you with exceptional heart care, we have created designated Provider Care  Teams.  These Care Teams include your primary Cardiologist (physician) and Advanced Practice Providers (APPs -  Physician Assistants and Nurse Practitioners) who all work together to provide you with the care you need, when you need it.  Your next appointment:   You will be called to schedule an appointment with PharmD.  November 2025  The format for your next appointment:   In Person  Provider:   Olinda Bertrand, [facility]  Other Instructions Your physician has asked for you to meet with PharmD in order to manage medications for GDMT.  Call our office if you decide to proceed with a stress test or cardioversion.    1st Floor: - Lobby - Registration  - Pharmacy  - Lab - Cafe  2nd Floor: - PV Lab - Diagnostic Testing (echo, CT, nuclear med)  3rd Floor: - Vacant  4th Floor: - TCTS (cardiothoracic surgery) - AFib Clinic - Structural Heart Clinic - Vascular Surgery  - Vascular Ultrasound  5th Floor: - [facility] Cardiology (general and EP) - Clinical Pharmacy for coumadin, hypertension, lipid, weight-loss medications, and med management appointments    Valet parking services will be available as well.

## 2023-11-22 ENCOUNTER — Other Ambulatory Visit

## 2023-11-22 DIAGNOSIS — Z7901 Long term (current) use of anticoagulants: Secondary | ICD-10-CM | POA: Diagnosis not present

## 2023-11-22 DIAGNOSIS — I10 Essential (primary) hypertension: Secondary | ICD-10-CM | POA: Diagnosis not present

## 2023-11-22 DIAGNOSIS — I4891 Unspecified atrial fibrillation: Secondary | ICD-10-CM | POA: Diagnosis not present

## 2023-11-22 DIAGNOSIS — I4892 Unspecified atrial flutter: Secondary | ICD-10-CM | POA: Diagnosis not present

## 2023-11-22 LAB — HEMOGLOBIN AND HEMATOCRIT, BLOOD
Hematocrit: 48.8 % (ref 37.5–51.0)
Hemoglobin: 16.7 g/dL (ref 13.0–17.7)

## 2023-11-23 LAB — BASIC METABOLIC PANEL WITH GFR
BUN/Creatinine Ratio: 11 (ref 10–24)
BUN: 14 mg/dL (ref 8–27)
CO2: 20 mmol/L (ref 20–29)
Calcium: 9 mg/dL (ref 8.6–10.2)
Chloride: 104 mmol/L (ref 96–106)
Creatinine, Ser: 1.25 mg/dL (ref 0.76–1.27)
Glucose: 108 mg/dL — ABNORMAL HIGH (ref 70–99)
Potassium: 4.7 mmol/L (ref 3.5–5.2)
Sodium: 141 mmol/L (ref 134–144)
eGFR: 62 mL/min/{1.73_m2} (ref >59)

## 2023-11-25 DIAGNOSIS — M79674 Pain in right toe(s): Secondary | ICD-10-CM | POA: Diagnosis not present

## 2023-11-25 DIAGNOSIS — B351 Tinea unguium: Secondary | ICD-10-CM | POA: Diagnosis not present

## 2023-11-25 DIAGNOSIS — M79675 Pain in left toe(s): Secondary | ICD-10-CM | POA: Diagnosis not present

## 2023-11-30 ENCOUNTER — Telehealth: Payer: Self-pay | Admitting: Cardiology

## 2023-11-30 NOTE — Telephone Encounter (Signed)
Patient returned staff call. 

## 2023-11-30 NOTE — Telephone Encounter (Signed)
 Attempted to call pt, went to VM x3. LMTCB.

## 2023-12-08 ENCOUNTER — Encounter: Payer: Self-pay | Admitting: Urology

## 2023-12-08 ENCOUNTER — Ambulatory Visit: Admitting: Urology

## 2023-12-08 VITALS — BP 110/75 | HR 91

## 2023-12-08 DIAGNOSIS — R972 Elevated prostate specific antigen [PSA]: Secondary | ICD-10-CM

## 2023-12-08 LAB — URINALYSIS, ROUTINE W REFLEX MICROSCOPIC
Bilirubin, UA: NEGATIVE
Glucose, UA: NEGATIVE
Ketones, UA: NEGATIVE
Leukocytes,UA: NEGATIVE
Nitrite, UA: NEGATIVE
Protein,UA: NEGATIVE
RBC, UA: NEGATIVE
Specific Gravity, UA: 1.03 (ref 1.005–1.030)
Urobilinogen, Ur: 1 mg/dL (ref 0.2–1.0)
pH, UA: 5.5 (ref 5.0–7.5)

## 2023-12-08 NOTE — Patient Instructions (Signed)

## 2023-12-08 NOTE — Progress Notes (Signed)
 12/08/2023 2:06 PM   Mike Thomas 07-24-1951 [id number]  Referring provider: Anton Baton, NP [address] [place],  [place] [address]  Elevated PSA   [name] is a 73yo here for evaluation of elevated PSA. PSA was 8.1 seven months ago and then 8.8 in 06/2023. IPSS 9 QOL 2 on flomax  0.4mg  daily. Urine stream strong. NO straining to urinate. Nocturia 2x.  Brother was diagnosed with prostate cancer at age 71. He is on eliquis .    PMH: Past Medical History:  Diagnosis Date   AKI (acute kidney injury) (HCC)    Allergy    Headache    Hx of migraines    Hyperlipidemia    Hypertension     Surgical History: Past Surgical History:  Procedure Laterality Date   CRANIOTOMY Bilateral 09/27/2020   Procedure: BILATERAL CRANIOTOMY FOR SUBDURAL HEMATOMA EVACUATION;  Surgeon: Mike Mar, MD;  Location: [facility] OR;  Service: Neurosurgery;  Laterality: Bilateral;   KNEE SURGERY Left 1971   TONSILLECTOMY      Home Medications:  Allergies as of 12/08/2023   No Known Allergies      Medication List        Accurate as of Dec 08, 2023  2:06 PM. If you have any questions, ask your nurse or doctor.          acetaminophen  325 MG tablet Commonly known as: TYLENOL  Take 1-2 tablets (325-650 mg total) by mouth every 4 (four) hours as needed for mild pain.   atorvastatin  20 MG tablet Commonly known as: LIPITOR Take 1 tablet (20 mg total) by mouth daily.   cyanocobalamin 1000 MCG tablet Commonly known as: VITAMIN B12 Take 1 tablet (1,000 mcg total) by mouth daily.   Eliquis  5 MG Tabs tablet Generic drug: apixaban  TAKE ONE TABLET BY MOUTH TWICE DAILY   losartan  25 MG tablet Commonly known as: COZAAR  Take 1 tablet (25 mg total) by mouth daily.   metoprolol  succinate 50 MG 24 hr tablet Commonly known as: TOPROL -XL Take 1 tablet (50 mg total) by mouth daily.   pantoprazole  40 MG tablet Commonly known as: PROTONIX  Take 1 tablet (40 mg total) by mouth at  bedtime.   tamsulosin  0.4 MG Caps capsule Commonly known as: FLOMAX  Take 1 capsule (0.4 mg total) by mouth daily after supper.   traZODone  50 MG tablet Commonly known as: DESYREL  Take 1 tablet (50 mg total) by mouth at bedtime as needed for sleep.        Allergies: No Known Allergies  Family History: Family History  Problem Relation Age of Onset   Cancer Mother 31   Colon cancer Mother 14       died age 28   Cancer Father 4       leukemia   Cancer Sister        breast   Diabetes Brother    Cancer Brother 47       prostate   Stomach cancer Brother        thinks dx age 66   Rectal cancer Neg Hx    Prostate cancer Neg Hx    Esophageal cancer Neg Hx    Liver cancer Neg Hx    Pancreatic cancer Neg Hx     Social History:  reports that he has been smoking cigarettes. He has a 15 pack-year smoking history. He has never used smokeless tobacco. He reports current alcohol use of about 20.0 standard drinks of alcohol per week. He reports  that he does not use drugs.  ROS: All other review of systems were reviewed and are negative except what is noted above in HPI  Physical Exam: BP 110/75   Pulse 91   Constitutional:  Alert and oriented, No acute distress. HEENT: [place] AT, moist mucus membranes.  Trachea midline, no masses. Cardiovascular: No clubbing, cyanosis, or edema. Respiratory: Normal respiratory effort, no increased work of breathing. GI: Abdomen is soft, nontender, nondistended, no abdominal masses GU: No CVA tenderness. Circumcised phallus. No masses/lesions on penis, testis, scrotum. Prostate 40g smooth no nodules no induration.  Lymph: No cervical or inguinal lymphadenopathy. Skin: No rashes, bruises or suspicious lesions. Neurologic: Grossly intact, no focal deficits, moving all 4 extremities. Psychiatric: Normal mood and affect.  Laboratory Data: Lab Results  Component Value Date   WBC 5.2 04/26/2023   HGB 16.7 11/22/2023   HCT 48.8 11/22/2023   MCV 102  (H) 04/26/2023   PLT 146 (L) 04/26/2023    Lab Results  Component Value Date   CREATININE 1.25 11/22/2023    No results found for: "PSA"  No results found for: "TESTOSTERONE"  No results found for: "HGBA1C"  Urinalysis    Component Value Date/Time   COLORURINE YELLOW 10/11/2020 1709   APPEARANCEUR CLEAR 10/11/2020 1709   LABSPEC 1.016 10/11/2020 1709   PHURINE 6.0 10/11/2020 1709   GLUCOSEU NEGATIVE 10/11/2020 1709   HGBUR LARGE (A) 10/11/2020 1709   BILIRUBINUR NEGATIVE 10/11/2020 1709   KETONESUR 5 (A) 10/11/2020 1709   PROTEINUR NEGATIVE 10/11/2020 1709   NITRITE NEGATIVE 10/11/2020 1709   LEUKOCYTESUR NEGATIVE 10/11/2020 1709    Lab Results  Component Value Date   BACTERIA NONE SEEN 10/11/2020    Pertinent Imaging:  No results found for this or any previous visit.  No results found for this or any previous visit.  No results found for this or any previous visit.  No results found for this or any previous visit.  No results found for this or any previous visit.  No results found for this or any previous visit.  No results found for this or any previous visit.  No results found for this or any previous visit.   Assessment & Plan:    1. Elevated PSA (Primary) The patient and I talked about etiologies of elevated PSA.  We discussed the possible relationship between elevated PSA and prostate cancer, BPH, prostatitis, infection trauma and recent ejaculations.  I will see him back in 6 months with a PSA - Urinalysis, Routine w reflex microscopic   No follow-ups on file.  Mike Nailer, MD  [facility] [place]

## 2023-12-09 ENCOUNTER — Ambulatory Visit: Payer: Self-pay

## 2023-12-09 NOTE — Telephone Encounter (Signed)
 Spoke with pt over the phone. Result note reviewed with pt. No further questions.

## 2024-01-11 ENCOUNTER — Ambulatory Visit: Admitting: Pharmacist

## 2024-02-01 ENCOUNTER — Other Ambulatory Visit: Payer: Self-pay | Admitting: Nurse Practitioner

## 2024-02-01 NOTE — Telephone Encounter (Signed)
 Prescription refill request for Eliquis  received. Indication: AF Last office visit: 11/15/23  S Tolia DO Scr: 1.25 on 11/22/23  Epic Age: 73 Weight: 93kg  Based on above findings Eliquis  5mg  twice daily is the appropriate dose.  Refill approved.

## 2024-02-22 ENCOUNTER — Ambulatory Visit (INDEPENDENT_AMBULATORY_CARE_PROVIDER_SITE_OTHER): Payer: 59 | Admitting: Nurse Practitioner

## 2024-02-22 ENCOUNTER — Encounter: Payer: Self-pay | Admitting: Nurse Practitioner

## 2024-02-22 ENCOUNTER — Ambulatory Visit: Admitting: Pharmacist

## 2024-02-22 VITALS — BP 93/64 | HR 60 | Temp 98.4°F | Ht 72.0 in | Wt 199.0 lb

## 2024-02-22 DIAGNOSIS — G4709 Other insomnia: Secondary | ICD-10-CM | POA: Diagnosis not present

## 2024-02-22 DIAGNOSIS — R351 Nocturia: Secondary | ICD-10-CM

## 2024-02-22 DIAGNOSIS — E782 Mixed hyperlipidemia: Secondary | ICD-10-CM | POA: Diagnosis not present

## 2024-02-22 DIAGNOSIS — R972 Elevated prostate specific antigen [PSA]: Secondary | ICD-10-CM | POA: Diagnosis not present

## 2024-02-22 DIAGNOSIS — K219 Gastro-esophageal reflux disease without esophagitis: Secondary | ICD-10-CM

## 2024-02-22 DIAGNOSIS — I10 Essential (primary) hypertension: Secondary | ICD-10-CM

## 2024-02-22 NOTE — Progress Notes (Signed)
 Established Patient Office Visit  Subjective  [name], male    DOB: January 23, 1951  Age: 73 y.o. MRN: [id number]  Chief Complaint  Patient presents with   Medical Management of Chronic Issues    6 month follow up    HPI Mike Thomas is a 73 year old male seen February 22, 2024 for 6 months follow-up for chronic disease management Hypertension, follow-up  BP Readings from Last 3 Encounters:  02/22/24 93/64  12/08/23 110/75  11/15/23 110/80   Wt Readings from Last 3 Encounters:  02/22/24 199 lb (90.3 kg)  11/15/23 205 lb (93 kg)  08/25/23 209 lb 6.4 oz (95 kg)     He was last seen for hypertension 6 months ago.  BP at that visit was Toprol  50 mg daily and . Management since that visit includes losartan  25 mg daily.  He reports excellent compliance with treatment. He is not having side effects. He is following a Regular diet. He is not exercising. He does not smoke 3-4 cigarettes daily  Use of agents associated with hypertension: none.   Outside blood pressures are not being monitored. Symptoms: No chest pain No chest pressure  No palpitations No syncope  No dyspnea No orthopnea  No paroxysmal nocturnal dyspnea No lower extremity edema   Pertinent labs Lab Results  Component Value Date   CHOL 172 04/26/2023   HDL 62 04/26/2023   LDLCALC 88 04/26/2023   TRIG 125 04/26/2023   CHOLHDL 2.8 04/26/2023   Lab Results  Component Value Date   NA 141 11/22/2023   K 4.7 11/22/2023   CREATININE 1.25 11/22/2023   EGFR 62 11/22/2023   GLUCOSE 108 (H) 11/22/2023   TSH 2.030 04/26/2023     The 10-year ASCVD risk score (Arnett DK, et al., 2019) is: 13.4%  Lipid/Cholesterol, Follow-up  Last lipid panel Other pertinent labs  Lab Results  Component Value Date   CHOL 172 04/26/2023   HDL 62 04/26/2023   LDLCALC 88 04/26/2023   TRIG 125 04/26/2023   CHOLHDL 2.8 04/26/2023   Lab Results  Component Value Date   ALT 27 04/26/2023   AST 30 04/26/2023   PLT  146 (L) 04/26/2023   TSH 2.030 04/26/2023     He was last seen for this 6 months ago.  Management since that visit includes Lipitor.  He reports excellent compliance with treatment. He is not having side effects.  Symptoms: No chest pain No chest pressure/discomfort  No dyspnea No lower extremity edema  No numbness or tingling of extremity No orthopnea  No palpitations No paroxysmal nocturnal dyspnea  No speech difficulty No syncope   Current diet: on average, 2 meals per day Current exercise: none  The 10-year ASCVD risk score (Arnett DK, et al., 2019) is: 13.4%  GERD, Follow up:  The patient was last seen for GERD 6 months ago. Changes made since that visit include pantoprazole  40 mg.  He reports excellent compliance with treatment. He is not having side effects. SABRA  He IS experiencing . He is NOT experiencing   The patient has a diagnosis of insomnia, currently managed with Trazodone  50 mg taken at bedtime as needed. He reports no adverse side effects from the medication. She is currently obtaining approximately 6-7 hours of sleep per night, which he feels is an improvement compared to his baseline prior to starting medication. He describes difficulty with initial sleep onset, typically taking 45 minutes to 1 hour to fall asleep. Once asleep, he remains  asleep through the night with occasional early morning awakening, but is usually able to return to sleep. Denies frequent nighttime awakenings, nightmares, or restless leg symptoms. he Denies use of alcohol, nicotine, or other substances that may affect sleep. Denies  daytime fatigue but no significant impairment in functioning. Overall, he is satisfied with his current sleep pattern and wishes to continue the current medication regimen.    Patient Active Problem List   Diagnosis Date Noted   Mixed hyperlipidemia 08/25/2023   Insomnia 08/25/2023   Gastroesophageal reflux disease without esophagitis 08/25/2023   Primary  hypertension 08/25/2023   Abnormal prostate specific antigen (PSA) 04/27/2023   Family history of diabetes mellitus in brother 04/26/2023   Ingrowing right great toenail 04/26/2023   Encounter for general adult medical examination with abnormal findings 04/26/2023   Uncomplicated alcohol dependence (HCC) 04/26/2023   Encounter for smoking cessation counseling 04/26/2023   AKI (acute kidney injury) (HCC)    Urine culture positive    Hyponatremia    Drug induced constipation    Urinary retention    Vascular headache    Bilateral subdural hematomas (HCC) 10/03/2020   HFrEF (heart failure with reduced ejection fraction) (HCC)    Atrial fibrillation (HCC)    SDH (subdural hematoma) (HCC) 09/27/2020   S/P craniotomy 09/27/2020   Subdural hematoma (HCC) 09/25/2020   Newly recognized heart murmur 12/26/2014   Elevated blood pressure 12/26/2014   Dupuytren's contracture 12/26/2014   Past Medical History:  Diagnosis Date   AKI (acute kidney injury) (HCC)    Allergy    Headache    Hx of migraines    Hyperlipidemia    Hypertension    Past Surgical History:  Procedure Laterality Date   CRANIOTOMY Bilateral 09/27/2020   Procedure: BILATERAL CRANIOTOMY FOR SUBDURAL HEMATOMA EVACUATION;  Surgeon: Joshua Alm RAMAN, MD;  Location: [facility] OR;  Service: Neurosurgery;  Laterality: Bilateral;   KNEE SURGERY Left 1971   TONSILLECTOMY     Social History   Tobacco Use   Smoking status: Every Day    Current packs/day: 0.50    Average packs/day: 0.5 packs/day for 30.0 years (15.0 ttl pk-yrs)    Types: Cigarettes   Smokeless tobacco: Never  Vaping Use   Vaping status: Never Used  Substance Use Topics   Alcohol use: Yes    Alcohol/week: 20.0 standard drinks of alcohol    Types: 20 Glasses of wine per week   Drug use: No   Social History   Socioeconomic History   Marital status: Divorced    Spouse name: Not on file   Number of children: 2   Years of education: Not on file   Highest education  level: Not on file  Occupational History   Not on file  Tobacco Use   Smoking status: Every Day    Current packs/day: 0.50    Average packs/day: 0.5 packs/day for 30.0 years (15.0 ttl pk-yrs)    Types: Cigarettes   Smokeless tobacco: Never  Vaping Use   Vaping status: Never Used  Substance and Sexual Activity   Alcohol use: Yes    Alcohol/week: 20.0 standard drinks of alcohol    Types: 20 Glasses of wine per week   Drug use: No   Sexual activity: Not Currently  Other Topics Concern   Not on file  Social History Narrative   Not on file   Social Drivers of Health   Financial Resource Strain: Low Risk  (09/24/2022)   Received from [facility]  Overall Financial Resource Strain (CARDIA)    Difficulty of Paying Living Expenses: Not very hard  Food Insecurity: No Food Insecurity (02/22/2024)   Hunger Vital Sign    Worried About Running Out of Food in the Last Year: Never true    Ran Out of Food in the Last Year: Never true  Transportation Needs: Unmet Transportation Needs (02/22/2024)   PRAPARE - Administrator, Civil Service (Medical): Yes    Lack of Transportation (Non-Medical): No  Physical Activity: Unknown (09/24/2022)   Received from [facility]   Exercise Vital Sign    On average, how many days per week do you engage in moderate to strenuous exercise (like a brisk walk)?: 0 days    Minutes of Exercise per Session: Not on file  Stress: Stress Concern Present (09/24/2022)   Received from [facility] - Occupational Stress Questionnaire    Feeling of Stress : Rather much  Social Connections: Socially Isolated (09/24/2022)   Received from [facility]   Social Network    How would you rate your social network (family, work, friends)?: Little participation, lonely and socially isolated  Intimate Partner Violence: Not At Risk (02/22/2024)   Humiliation, Afraid, Rape, and Kick questionnaire    Fear of Current or  Ex-Partner: No    Emotionally Abused: No    Physically Abused: No    Sexually Abused: No   Family Status  Relation Name Status   Mother  Deceased at age 30   Father  Deceased at age 13   Sister  Alive   Brother  Alive   Brother  Alive   Neg Hx  (Not Specified)  No partnership data on file   Family History  Problem Relation Age of Onset   Cancer Mother 47   Colon cancer Mother 86       died age 35   Cancer Father 36       leukemia   Cancer Sister        breast   Diabetes Brother    Cancer Brother 20       prostate   Stomach cancer Brother        thinks dx age 25   Rectal cancer Neg Hx    Prostate cancer Neg Hx    Esophageal cancer Neg Hx    Liver cancer Neg Hx    Pancreatic cancer Neg Hx    No Known Allergies    Review of Systems  Constitutional:  Negative for chills and fever.  HENT:  Negative for congestion and sore throat.   Respiratory:  Negative for cough, shortness of breath and wheezing.   Cardiovascular:  Negative for chest pain and leg swelling.  Gastrointestinal:  Negative for blood in stool, constipation, diarrhea, nausea and vomiting.  Skin:  Negative for itching and rash.  Neurological:  Negative for dizziness and headaches.   Negative unless indicated in HPI   Objective:     BP 93/64   Pulse 60   Temp 98.4 F (36.9 C)   Ht 6' (1.829 m)   Wt 199 lb (90.3 kg)   SpO2 97%   BMI 26.99 kg/m  BP Readings from Last 3 Encounters:  02/22/24 93/64  12/08/23 110/75  11/15/23 110/80   Wt Readings from Last 3 Encounters:  02/22/24 199 lb (90.3 kg)  11/15/23 205 lb (93 kg)  08/25/23 209 lb 6.4 oz (95 kg)      Physical Exam  Vitals and nursing note reviewed.  Constitutional:      General: He is not in acute distress. HENT:     Head: Normocephalic and atraumatic.     Nose: Nose normal.     Mouth/Throat:     Mouth: Mucous membranes are moist.  Eyes:     Extraocular Movements: Extraocular movements intact.     Conjunctiva/sclera:  Conjunctivae normal.     Pupils: Pupils are equal, round, and reactive to light.  Cardiovascular:     Heart sounds: Normal heart sounds.  Pulmonary:     Effort: Pulmonary effort is normal.     Breath sounds: Normal breath sounds.  Abdominal:     General: Bowel sounds are normal.     Palpations: Abdomen is soft.  Musculoskeletal:        General: Normal range of motion.     Right lower leg: No edema.     Left lower leg: No edema.  Skin:    General: Skin is warm and dry.  Neurological:     Mental Status: He is oriented to person, place, and time.  Psychiatric:        Mood and Affect: Mood normal.        Behavior: Behavior normal.        Thought Content: Thought content normal.        Judgment: Judgment normal.      No results found for any visits on 02/22/24.  Last CBC Lab Results  Component Value Date   WBC 5.2 04/26/2023   HGB 16.7 11/22/2023   HCT 48.8 11/22/2023   MCV 102 (H) 04/26/2023   MCH 33.1 (H) 04/26/2023   RDW 11.9 04/26/2023   PLT 146 (L) 04/26/2023   Last metabolic panel Lab Results  Component Value Date   GLUCOSE 108 (H) 11/22/2023   NA 141 11/22/2023   K 4.7 11/22/2023   CL 104 11/22/2023   CO2 20 11/22/2023   BUN 14 11/22/2023   CREATININE 1.25 11/22/2023   EGFR 62 11/22/2023   CALCIUM  9.0 11/22/2023   PROT 5.8 (L) 04/26/2023   ALBUMIN  3.9 04/26/2023   LABGLOB 1.9 04/26/2023   BILITOT 0.5 04/26/2023   ALKPHOS 55 04/26/2023   AST 30 04/26/2023   ALT 27 04/26/2023   ANIONGAP 6 10/21/2020   Last lipids Lab Results  Component Value Date   CHOL 172 04/26/2023   HDL 62 04/26/2023   LDLCALC 88 04/26/2023   TRIG 125 04/26/2023   CHOLHDL 2.8 04/26/2023    Last thyroid  functions Lab Results  Component Value Date   TSH 2.030 04/26/2023   T4TOTAL 8.5 04/26/2023        Assessment & Plan:  Abnormal prostate specific antigen (PSA) -     PSA, total and free  Primary hypertension  Gastroesophageal reflux disease without  esophagitis  Mixed hyperlipidemia  Other insomnia  Nocturia -     PSA, Medicare -     PR PSA SCREENING  Mike Thomas is a 73 year old Caucasian male seen today for chronic disease management, no acute distress Nocturia: PSA ordered result pending Insomnia: Continue trazodone  50 mg as needed no refill needed today GERD: Continue pantoprazole  40 mg daily no refill needed Hypertension: Continue metoprolol  50 mg daily and losartan  25 mg daily no refill needed Hyperlipidemia: continue Lipitor 20 mg daily no refill needed    The above assessment and management plan was discussed with the patient. The patient verbalized understanding of and has agreed to the management plan. Patient  is aware to call the clinic if they develop any new symptoms or if symptoms persist or worsen. Patient is aware when to return to the clinic for a follow-up visit. Patient educated on when it is appropriate to go to the emergency department.  Return in about 6 months (around 08/24/2024) for chronic Diseases Management.    Mike Veras [place] Thompson, DNP [facility] [address] [place], [place] [address] [phone]    Note: This document was prepared by [employer] voice dictation technology and any errors that results from this process are unintentional.

## 2024-02-23 ENCOUNTER — Ambulatory Visit: Payer: Self-pay | Admitting: Nurse Practitioner

## 2024-02-23 DIAGNOSIS — R972 Elevated prostate specific antigen [PSA]: Secondary | ICD-10-CM

## 2024-02-23 LAB — PSA, TOTAL AND FREE
PSA, Free Pct: 15 %
PSA, Free: 1.86 ng/mL
Prostate Specific Ag, Serum: 12.4 ng/mL — ABNORMAL HIGH (ref 0.0–4.0)

## 2024-03-09 ENCOUNTER — Other Ambulatory Visit: Payer: Self-pay | Admitting: Nurse Practitioner

## 2024-03-09 DIAGNOSIS — I10 Essential (primary) hypertension: Secondary | ICD-10-CM

## 2024-03-14 ENCOUNTER — Telehealth: Payer: Self-pay

## 2024-03-14 NOTE — Telephone Encounter (Signed)
 Pt called to confirm his upcoming appointments and was confused as to why he had appointment for September and November pt was advised that his November appt was made at his prior appt pt voiced his understanding pt also stated he had previous blood work done and wanted to make sure we had it pt advised we did

## 2024-03-17 ENCOUNTER — Other Ambulatory Visit: Payer: Self-pay | Admitting: Nurse Practitioner

## 2024-03-17 DIAGNOSIS — K219 Gastro-esophageal reflux disease without esophagitis: Secondary | ICD-10-CM

## 2024-03-17 DIAGNOSIS — G4700 Insomnia, unspecified: Secondary | ICD-10-CM

## 2024-03-17 DIAGNOSIS — E782 Mixed hyperlipidemia: Secondary | ICD-10-CM

## 2024-03-17 DIAGNOSIS — R972 Elevated prostate specific antigen [PSA]: Secondary | ICD-10-CM

## 2024-03-30 ENCOUNTER — Ambulatory Visit: Attending: Cardiology | Admitting: Pharmacist

## 2024-03-30 VITALS — BP 110/80 | HR 79

## 2024-03-30 DIAGNOSIS — I5020 Unspecified systolic (congestive) heart failure: Secondary | ICD-10-CM | POA: Diagnosis not present

## 2024-03-30 MED ORDER — EMPAGLIFLOZIN 10 MG PO TABS: 10.0000 mg | ORAL_TABLET | Freq: Every day | ORAL | 11 refills | Status: AC

## 2024-03-30 NOTE — Progress Notes (Signed)
 [name]                 DOB: [birth year]                      MRN: [id number]     [name] is a 73 y.o. male referred by Dr. Michele to pharmacy clinic for HF medication management. 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 . Most recent LVEF 40-45% on 05/07/23.  Discussion with patient today included the following: cardiac medication indications, introduction to GDMT clinic, reasoning behind medication titration, importance of medication adherence, and patient engagement. . At last visit with MD losartan  was added. Patient states that he is feeling better. He has rare dizziness, some weakness in his legs first thing in the AM. He walks several times a day. Has some SOB on incline, but SOB has improved. Sleeps with 2 pillows.   We discussed the importance of trying to fix (cardioversion) or rule out (PET/CT) causes of his low EF. Reminded him that he needs to take care of himself in order to take care of his loved ones. He is concerned about recovery time and no one to take care of him. I reviewed the process of both tests with him.   Current CHF meds: losartan  25mg  daily, metoprolol  succinate 50mg  daily Previously tried:  Adherence Assessment  Do you ever forget to take your medication? [] Yes [] No  Do you ever skip doses due to side effects? [] Yes [] No  Do you have trouble affording your medicines? [] Yes [] No  Are you ever unable to pick up your medication due to transportation difficulties? [] Yes [] No  Do you ever stop taking your medications because you don't believe they are helping? [] Yes [] No  Do you check your weight daily? [] Yes [] No   BP goal: <130/80  Family History:  Family History  Problem  Relation Age of Onset   Cancer Mother 67   Colon cancer Mother 64       died age 15   Cancer Father 39       leukemia   Cancer Sister        breast   Diabetes Brother    Cancer Brother 33       prostate   Stomach cancer Brother        thinks dx age 64   Rectal cancer Neg Hx    Prostate cancer Neg Hx    Esophageal cancer Neg Hx    Liver cancer Neg Hx    Pancreatic cancer Neg Hx     Social History: drinks 2 glasses of wine per night  Diet: not reviewed in detail  Exercise: walks about 0.5 miles 2-4 times per day, walks to grocery store  Home BP readings: doesn't check  Wt Readings from Last 3 Encounters:  02/22/24 199 lb (90.3 kg)  11/15/23 205 lb (93 kg)  08/25/23 209 lb 6.4 oz (95 kg)   BP Readings from Last 3 Encounters:  03/30/24 110/80  02/22/24 93/64  12/08/23 110/75   Pulse Readings from Last 3 Encounters:  03/30/24 79  02/22/24 60  12/08/23 91    Renal function: CrCl cannot be calculated (Patient's most recent lab result is older than the maximum 21 days allowed.).  Past Medical History:  Diagnosis  Date   AKI (acute kidney injury) (HCC)    Allergy    Headache    Hx of migraines    Hyperlipidemia    Hypertension     Current Outpatient Medications on File Prior to Visit  Medication Sig Dispense Refill   apixaban  (ELIQUIS ) 5 MG TABS tablet TAKE ONE TABLET BY MOUTH TWICE DAILY 60 tablet 5   atorvastatin  (LIPITOR) 20 MG tablet TAKE 1 TABLET DAILY 90 tablet 1   cyanocobalamin (VITAMIN B12) 1000 MCG tablet Take 1 tablet (1,000 mcg total) by mouth daily. 90 tablet 1   losartan  (COZAAR ) 25 MG tablet Take 1 tablet (25 mg total) by mouth daily. 90 tablet 3   metoprolol  succinate (TOPROL -XL) 50 MG 24 hr tablet TAKE 1 TABLET DAILY 90 tablet 1   pantoprazole  (PROTONIX ) 40 MG tablet TAKE 1 TABLET AT BEDTIME 90 tablet 1   tamsulosin  (FLOMAX ) 0.4 MG CAPS capsule TAKE 1 CAPSULE DAILY WITH SUPPER 90 capsule 1   traZODone  (DESYREL ) 50 MG tablet TAKE 1 TABLET AT  BEDTIME AS NEEDED FOR SLEEP 90 tablet 1   acetaminophen  (TYLENOL ) 325 MG tablet Take 1-2 tablets (325-650 mg total) by mouth every 4 (four) hours as needed for mild pain.     No current facility-administered medications on file prior to visit.    No Known Allergies   Assessment/Plan:  1. CHF -  HFrEF (heart failure with reduced ejection fraction) (HCC) Assessment: Blood pressure on the lower end today Patient drinking 2 glasses of wine per day, recommended decreasing alcohol intake Fairly active with minimal shortness of breath Heart rate today 79 but was 60 at PCPs visit no home readings available Blood pressure at PCPs was also low Reviewed the importance of GDMT Needs BMP today due to addition of losartan  His potassium at baseline was around 4.7 I do not imagine that after the addition of losartan  we would be able to add spironolactone due to potassium  Plan: Start Jardiance  10 mg daily Continue losartan  25 mg daily and metoprolol  50 mg daily Follow-up echo 10/2   Thank you   Pura Picinich D Cleophas Yoak, Pharm.JONETTA SARAN, CPP [facility] Division of [facility] [address], [place], [place] [address]  Phone: [phone]; Fax: [phone]

## 2024-03-30 NOTE — Assessment & Plan Note (Signed)
 Assessment: Blood pressure on the lower end today Patient drinking 2 glasses of wine per day, recommended decreasing alcohol intake Fairly active with minimal shortness of breath Heart rate today 79 but was 60 at PCPs visit no home readings available Blood pressure at PCPs was also low Reviewed the importance of GDMT Needs BMP today due to addition of losartan  His potassium at baseline was around 4.7 I do not imagine that after the addition of losartan  we would be able to add spironolactone due to potassium  Plan: Start Jardiance  10 mg daily Continue losartan  25 mg daily and metoprolol  50 mg daily Follow-up echo 10/2

## 2024-03-30 NOTE — Patient Instructions (Signed)
 Please stop by the lab on the 1st floor on your way out Please start taking Jardiance  10mg  daily Continue taking losartan  25mg  daily and metoprolol  succinate 50mg  daily

## 2024-03-31 ENCOUNTER — Other Ambulatory Visit

## 2024-03-31 LAB — BASIC METABOLIC PANEL WITH GFR
BUN/Creatinine Ratio: 10 (ref 10–24)
BUN: 12 mg/dL (ref 8–27)
CO2: 21 mmol/L (ref 20–29)
Calcium: 9.1 mg/dL (ref 8.6–10.2)
Chloride: 102 mmol/L (ref 96–106)
Creatinine, Ser: 1.25 mg/dL (ref 0.76–1.27)
Glucose: 94 mg/dL (ref 70–99)
Potassium: 5 mmol/L (ref 3.5–5.2)
Sodium: 140 mmol/L (ref 134–144)
eGFR: 62 mL/min/1.73 (ref >59)

## 2024-04-03 ENCOUNTER — Ambulatory Visit: Payer: Self-pay | Admitting: Pharmacist

## 2024-04-19 ENCOUNTER — Ambulatory Visit: Admitting: Urology

## 2024-04-27 ENCOUNTER — Ambulatory Visit (HOSPITAL_COMMUNITY)

## 2024-05-11 ENCOUNTER — Ambulatory Visit (HOSPITAL_COMMUNITY)
Admission: RE | Admit: 2024-05-11 | Discharge: 2024-05-11 | Disposition: A | Discharge status: Home / Self Care | Source: Ambulatory Visit | Attending: Cardiology | Admitting: Cardiology

## 2024-05-11 DIAGNOSIS — I350 Nonrheumatic aortic (valve) stenosis: Secondary | ICD-10-CM | POA: Diagnosis not present

## 2024-05-11 LAB — ECHOCARDIOGRAM COMPLETE
AR max vel: 1.19 cm2
AV Area VTI: 1.37 cm2
AV Area mean vel: 1.32 cm2
AV Mean grad: 10.7 mmHg
AV Peak grad: 24.4 mmHg
Ao pk vel: 2.47 m/s
S' Lateral: 3.21 cm

## 2024-05-11 MED ORDER — PERFLUTREN LIPID MICROSPHERE
1.0000 mL | INTRAVENOUS | Status: AC | PRN
  Administered 2024-05-11: 2 mL via INTRAVENOUS

## 2024-06-09 ENCOUNTER — Other Ambulatory Visit

## 2024-06-16 ENCOUNTER — Ambulatory Visit: Admitting: Urology

## 2024-07-06 NOTE — Telephone Encounter (Signed)
 Spoke with patient. Relayed Dr. Tyree note. Patient expressed that with his medicare plan and inability to drive coupled with the out of pocket cost for the Calcium  Score CT it was not feasible to have the transportation and the money for the test to have the test completed in time prior to his office visit on 12/15.

## 2024-07-10 ENCOUNTER — Ambulatory Visit: Admitting: Cardiology

## 2024-07-11 ENCOUNTER — Telehealth: Payer: Self-pay | Admitting: Pharmacist

## 2024-07-11 NOTE — Telephone Encounter (Signed)
 Pt c/o medication issue:  1. Name of Medication:   empagliflozin  (JARDIANCE ) 10 MG TABS tablet    2. How are you currently taking this medication (dosage and times per day)? As  written   3. Are you having a reaction (difficulty breathing--STAT)? No   4. What is your medication issue? Josh with [facility] pharmacy called in asking if someone can call him back to let him know why he was on jardiance ? Please advise.

## 2024-07-17 NOTE — Telephone Encounter (Signed)
 Josh made aware that Jardiance  is for CHF

## 2024-08-07 ENCOUNTER — Encounter: Payer: Self-pay | Admitting: *Deleted

## 2024-08-09 ENCOUNTER — Other Ambulatory Visit: Payer: Self-pay | Admitting: Cardiology

## 2024-08-21 ENCOUNTER — Ambulatory Visit: Admitting: Nurse Practitioner

## 2024-08-24 ENCOUNTER — Ambulatory Visit: Payer: Self-pay | Admitting: Nurse Practitioner

## 2024-09-22 ENCOUNTER — Encounter: Admitting: Family Medicine

## 2024-10-19 ENCOUNTER — Ambulatory Visit: Admitting: Cardiology

## 2024-11-08 ENCOUNTER — Ambulatory Visit: Payer: Self-pay

## 2024-11-08 ENCOUNTER — Ambulatory Visit: Admitting: Urology
